# Patient Record
Sex: Female | Born: 1965 | Race: Black or African American | Hispanic: No | Marital: Single | Smoking: Current every day smoker
Health system: Southern US, Community
[De-identification: ages and names within clinical notes are randomized; demographics above are authoritative.]

## PROBLEM LIST (undated history)

## (undated) ENCOUNTER — Emergency Department (HOSPITAL_COMMUNITY): Disposition: A | Discharge status: Home / Self Care | Payer: Self-pay

## (undated) DIAGNOSIS — R569 Unspecified convulsions: Secondary | ICD-10-CM

## (undated) DIAGNOSIS — E0590 Thyrotoxicosis, unspecified without thyrotoxic crisis or storm: Secondary | ICD-10-CM

## (undated) DIAGNOSIS — K5790 Diverticulosis of intestine, part unspecified, without perforation or abscess without bleeding: Secondary | ICD-10-CM

## (undated) DIAGNOSIS — K8020 Calculus of gallbladder without cholecystitis without obstruction: Secondary | ICD-10-CM

## (undated) DIAGNOSIS — E042 Nontoxic multinodular goiter: Secondary | ICD-10-CM

## (undated) HISTORY — DX: Diverticulosis of intestine, part unspecified, without perforation or abscess without bleeding: K57.90

## (undated) HISTORY — DX: Nontoxic multinodular goiter: E04.2

## (undated) HISTORY — DX: Calculus of gallbladder without cholecystitis without obstruction: K80.20

## (undated) HISTORY — DX: Thyrotoxicosis, unspecified without thyrotoxic crisis or storm: E05.90

## (undated) HISTORY — DX: Unspecified convulsions: R56.9

---

## 1999-04-06 LAB — HM PAP SMEAR

## 2000-04-05 HISTORY — PX: ABDOMINAL HYSTERECTOMY: SHX81

## 2007-05-30 ENCOUNTER — Emergency Department (HOSPITAL_COMMUNITY): Admission: EM | Admit: 2007-05-30 | Discharge: 2007-05-30 | Payer: Self-pay | Admitting: Emergency Medicine

## 2008-07-17 ENCOUNTER — Emergency Department (HOSPITAL_COMMUNITY): Admission: EM | Admit: 2008-07-17 | Discharge: 2008-07-17 | Payer: Self-pay | Admitting: Emergency Medicine

## 2008-09-10 ENCOUNTER — Emergency Department (HOSPITAL_COMMUNITY): Admission: EM | Admit: 2008-09-10 | Discharge: 2008-09-10 | Payer: Self-pay | Admitting: Family Medicine

## 2008-11-19 ENCOUNTER — Emergency Department (HOSPITAL_COMMUNITY): Admission: EM | Admit: 2008-11-19 | Discharge: 2008-11-19 | Payer: Self-pay | Admitting: Family Medicine

## 2010-05-29 ENCOUNTER — Other Ambulatory Visit (HOSPITAL_COMMUNITY): Payer: Self-pay | Admitting: Family Medicine

## 2010-05-29 DIAGNOSIS — E049 Nontoxic goiter, unspecified: Secondary | ICD-10-CM

## 2010-06-02 ENCOUNTER — Ambulatory Visit (HOSPITAL_COMMUNITY)
Admission: RE | Admit: 2010-06-02 | Discharge: 2010-06-02 | Disposition: A | Discharge status: Home / Self Care | Payer: Self-pay | Source: Ambulatory Visit | Attending: Family Medicine | Admitting: Family Medicine

## 2010-06-02 DIAGNOSIS — E049 Nontoxic goiter, unspecified: Secondary | ICD-10-CM | POA: Insufficient documentation

## 2010-06-12 ENCOUNTER — Other Ambulatory Visit (HOSPITAL_COMMUNITY): Payer: Self-pay | Admitting: Family Medicine

## 2010-06-12 DIAGNOSIS — E041 Nontoxic single thyroid nodule: Secondary | ICD-10-CM

## 2010-06-16 ENCOUNTER — Other Ambulatory Visit: Payer: Self-pay | Admitting: Interventional Radiology

## 2010-06-16 ENCOUNTER — Ambulatory Visit (HOSPITAL_COMMUNITY)
Admission: RE | Admit: 2010-06-16 | Discharge: 2010-06-16 | Disposition: A | Discharge status: Home / Self Care | Payer: Medicaid Other | Source: Ambulatory Visit | Attending: Family Medicine | Admitting: Family Medicine

## 2010-06-16 DIAGNOSIS — E041 Nontoxic single thyroid nodule: Secondary | ICD-10-CM | POA: Insufficient documentation

## 2010-09-02 ENCOUNTER — Emergency Department (HOSPITAL_COMMUNITY): Payer: Medicaid Other

## 2010-09-02 ENCOUNTER — Emergency Department (HOSPITAL_COMMUNITY)
Admission: EM | Admit: 2010-09-02 | Discharge: 2010-09-02 | Disposition: A | Discharge status: Home / Self Care | Payer: Medicaid Other | Attending: Emergency Medicine | Admitting: Emergency Medicine

## 2010-09-02 DIAGNOSIS — R059 Cough, unspecified: Secondary | ICD-10-CM | POA: Insufficient documentation

## 2010-09-02 DIAGNOSIS — J189 Pneumonia, unspecified organism: Secondary | ICD-10-CM | POA: Insufficient documentation

## 2010-09-02 DIAGNOSIS — R05 Cough: Secondary | ICD-10-CM | POA: Insufficient documentation

## 2010-09-02 DIAGNOSIS — E0590 Thyrotoxicosis, unspecified without thyrotoxic crisis or storm: Secondary | ICD-10-CM | POA: Insufficient documentation

## 2010-09-02 DIAGNOSIS — R569 Unspecified convulsions: Secondary | ICD-10-CM | POA: Insufficient documentation

## 2011-01-07 ENCOUNTER — Other Ambulatory Visit (HOSPITAL_COMMUNITY): Payer: Self-pay | Admitting: Endocrinology

## 2011-01-07 DIAGNOSIS — E0590 Thyrotoxicosis, unspecified without thyrotoxic crisis or storm: Secondary | ICD-10-CM

## 2011-01-14 ENCOUNTER — Encounter (HOSPITAL_COMMUNITY)
Admission: RE | Admit: 2011-01-14 | Discharge: 2011-01-14 | Disposition: A | Discharge status: Home / Self Care | Payer: Medicaid Other | Source: Ambulatory Visit | Attending: Endocrinology | Admitting: Endocrinology

## 2011-01-14 DIAGNOSIS — E0590 Thyrotoxicosis, unspecified without thyrotoxic crisis or storm: Secondary | ICD-10-CM | POA: Insufficient documentation

## 2011-01-15 ENCOUNTER — Encounter (HOSPITAL_COMMUNITY)
Admission: RE | Admit: 2011-01-15 | Discharge: 2011-01-15 | Disposition: A | Discharge status: Home / Self Care | Payer: Medicaid Other | Source: Ambulatory Visit | Attending: Endocrinology | Admitting: Endocrinology

## 2011-01-15 DIAGNOSIS — E0590 Thyrotoxicosis, unspecified without thyrotoxic crisis or storm: Secondary | ICD-10-CM | POA: Insufficient documentation

## 2011-01-15 DIAGNOSIS — E042 Nontoxic multinodular goiter: Secondary | ICD-10-CM | POA: Insufficient documentation

## 2011-01-25 ENCOUNTER — Other Ambulatory Visit (HOSPITAL_COMMUNITY): Payer: Self-pay | Admitting: Endocrinology

## 2011-01-25 DIAGNOSIS — E0590 Thyrotoxicosis, unspecified without thyrotoxic crisis or storm: Secondary | ICD-10-CM

## 2011-01-29 ENCOUNTER — Encounter (HOSPITAL_COMMUNITY)
Admission: RE | Admit: 2011-01-29 | Discharge: 2011-01-29 | Disposition: A | Discharge status: Home / Self Care | Payer: Medicaid Other | Source: Ambulatory Visit | Attending: Endocrinology | Admitting: Endocrinology

## 2011-01-29 DIAGNOSIS — E0590 Thyrotoxicosis, unspecified without thyrotoxic crisis or storm: Secondary | ICD-10-CM

## 2011-01-29 DIAGNOSIS — E0500 Thyrotoxicosis with diffuse goiter without thyrotoxic crisis or storm: Secondary | ICD-10-CM | POA: Insufficient documentation

## 2011-01-29 MED ORDER — SODIUM IODIDE I 131 CAPSULE
12.7000 | Freq: Once | INTRAVENOUS | Status: AC | PRN
  Administered 2011-01-29: 12.7 via ORAL

## 2012-01-13 ENCOUNTER — Emergency Department (INDEPENDENT_AMBULATORY_CARE_PROVIDER_SITE_OTHER)
Admission: EM | Admit: 2012-01-13 | Discharge: 2012-01-13 | Disposition: A | Discharge status: Home / Self Care | Payer: Self-pay | Source: Home / Self Care

## 2012-01-13 ENCOUNTER — Encounter (HOSPITAL_COMMUNITY): Payer: Self-pay | Admitting: *Deleted

## 2012-01-13 DIAGNOSIS — E0500 Thyrotoxicosis with diffuse goiter without thyrotoxic crisis or storm: Secondary | ICD-10-CM

## 2012-01-13 LAB — TSH: TSH: <0.008 u[IU]/mL — ABNORMAL LOW (ref 0.350–4.500)

## 2012-01-13 LAB — T4, FREE: Free T4: 1.63 ng/dL (ref 0.80–1.80)

## 2012-01-13 MED ORDER — METHIMAZOLE 10 MG PO TABS: 10.0000 mg | ORAL_TABLET | Freq: Three times a day (TID) | ORAL | Status: DC

## 2012-01-13 NOTE — ED Provider Notes (Signed)
History     CSN: [id number]  Arrival date & time 01/13/12  1048   First MD Initiated Contact with Patient 01/13/12 1057      Chief Complaint  Patient presents with  . Medication Refill   HPI 46 yr old female who had [facility] as PCP, and also goes to Dr. Lucianne Muss for for Graves disease diagnosed arpil 2013 presents for med refill SHe c/o sweats and tremors, as she rasn out of the medas about 1 week ago. SHe has been here about 7 yrs and moved here from [place] She states that no one in the family has history of hyperthyroidism, and she was diagnosed after she started developing chills and tremors She takes no other medications other than her methimazole 10 mg and a beta blocker She's not had lab work in about 2 months She cannot get in to see Dr. Lucianne Muss until December 17 of this year   Past Medical History  Diagnosis Date  . Thyroid disease     History reviewed. No pertinent past surgical history.  No family history on file.  History  Substance Use Topics  . Smoking status: Current Every Day Smoker  . Smokeless tobacco: Not on file  . Alcohol Use: No    OB History    Grav Para Term Preterm Abortions TAB SAB Ect Mult Living                  Review of Systems Chest pain shortness of breath no blurred or double vision no nausea or vomiting no abdominal pain Allergies  Naproxen  Home Medications   Current Outpatient Rx  Name Route Sig Dispense Refill  . METHIMAZOLE 10 MG PO TABS Oral Take 10 mg by mouth every morning.    Marland Kitchen MAXZIDE PO Oral Take by mouth.      There were no vitals taken for this visit.  Physical Exam Pleasant oriented African American female no apparent distress, Clinically clear no added sound Abdomen soft nontender nondistended Lower extremities notable  ED Course  Procedures (including critical care time)  Labs Reviewed - No data to display No results found.   No diagnosis found.    MDM  We will get a surveillance TSH and T4  at present time-she will need to redraw the labs in about maybe 2 months prior to her visit with Dr. Lucianne Muss. I refilled her methimazole 10 mg at present time-Dr. Lucianne Muss may want to address this and increase the dosage to 20 and 30 mg dependent on T4-T3 level now for a copy of this note to him to make sure he is aware that patient presented care she cannot get in to see him  Mahala Menghini, [facility] 11:40 AM         Rhetta Mura, MD 01/13/12 1141

## 2012-01-13 NOTE — ED Notes (Signed)
Pt  Ran out  Of  Her  thyriod  Med  Has  Not  Taken in 1  Week      Cannot  See  pcp  For  sev  Months     -  Reports  Some  Sweating  And  Feels  Anxious   Looks  However  Calm  And  In no  Severe  Distress

## 2012-01-20 ENCOUNTER — Telehealth (HOSPITAL_COMMUNITY): Payer: Self-pay | Admitting: *Deleted

## 2012-01-20 NOTE — ED Notes (Signed)
Free T4 1.63, TSH 0.008 L.  Lab shown to Dr. Lorenz Coaster. He said to notify pt. of results and tell her to get lab rechecked after she has been on her medication for 2 weeks. I called pt. Pt. verified x 2 and given results.  Pt. said she would call Dr. Remus Blake office and try to get in sooner. Has appointment in Nov.  She wrote down her results to give them.  I told her to come back here next week if she can't get seen there before November.  Pt. Voiced understanding. Kimberly Hanson 01/20/2012

## 2012-07-13 ENCOUNTER — Ambulatory Visit (INDEPENDENT_AMBULATORY_CARE_PROVIDER_SITE_OTHER): Payer: BC Managed Care – PPO | Admitting: Endocrinology

## 2012-07-13 ENCOUNTER — Encounter: Payer: Self-pay | Admitting: Endocrinology

## 2012-07-13 VITALS — BP 118/80 | HR 89 | Wt 225.0 lb

## 2012-07-13 DIAGNOSIS — E042 Nontoxic multinodular goiter: Secondary | ICD-10-CM

## 2012-07-13 DIAGNOSIS — E0590 Thyrotoxicosis, unspecified without thyrotoxic crisis or storm: Secondary | ICD-10-CM

## 2012-07-13 NOTE — Progress Notes (Signed)
  Subjective:    Patient ID: Kimberly Hanson, female    DOB: Oct 26, 1965, 47 y.o.   MRN: [id number]  HPI In 2012, pt was noted to have hyperthyroidism, and a nodular goiter.  After a bx (benign), she had i-131 rx later in 2012.  She was noted to have persistent hyperthyroidism, and was rx'ed tapazole.  Due to a gap in her insurance, she was not able to return for f/u.  She has since regained her insurance, and is still on the tapazole.  She has moderate palpitations in the chest, and assoc headache.   Past Medical History  Diagnosis Date  . Thyroid disease   . Seizures     Past Surgical History  Procedure Laterality Date  . Abdominal hysterectomy      History   Social History  . Marital Status: Single    Spouse Name: N/A    Number of Children: N/A  . Years of Education: N/A   Occupational History  . Not on file.   Social History Main Topics  . Smoking status: Current Every Day Smoker  . Smokeless tobacco: Never Used  . Alcohol Use: No  . Drug Use: Not on file  . Sexually Active: Not on file   Other Topics Concern  . Not on file   Social History Narrative  . No narrative on file    No current outpatient prescriptions on file prior to visit.   No current facility-administered medications on file prior to visit.    Allergies  Allergen Reactions  . Naproxen     No family history on file.  BP 118/80  Pulse 89  Wt 225 lb (102.059 kg)  SpO2 98%  Review of Systems She has fatigue, nausea, urinary frequency, acral numbness, tremor, easy bruising, anxiety, and excessive diaphoresis.  She denies weight loss, hoarseness, double vision, sob, diarrhea, myalgias, and rhinorrhea.  She has no menses (TAH 2002).    Objective:   Physical Exam VS: see vs page GEN: no distress HEAD: head: no deformity eyes: no periorbital swelling, no proptosis external nose and ears are normal mouth: no lesion seen NECK: approx 3 cm left thyroid nodule.   CHEST WALL: no deformity LUNGS:   Clear to auscultation CV: reg rate and rhythm, no murmur ABD: abdomen is soft, nontender.  no hepatosplenomegaly.  not distended.  no hernia MUSCULOSKELETAL: muscle bulk and strength are grossly normal.  no obvious joint swelling.  gait is normal and steady EXTEMITIES: no deformity.  no edema PULSES: dorsalis pedis intact bilat.  no carotid bruit NEURO:  cn 2-12 grossly intact.   readily moves all 4's.  sensation is intact to touch on all 4's.  Slight tremor of the hands SKIN:  Normal texture and temperature.  No rash or suspicious lesion is visible.  Slightly diaphoretic NODES:  None palpable at the neck PSYCH: alert, oriented x3.  Does not appear anxious nor depressed  Lab Results  Component Value Date   TSH 0.10* 07/13/2012      Assessment & Plan:  Multinodular goiter, persistent after i-131 rx Hyperthyroidism, prob due to the multinodular goiter.  Persistent after i-131 rx Acral numbness.  Uncertain if related to hyperthyroidism

## 2012-07-13 NOTE — Patient Instructions (Addendum)
Please stop taking the methimazole.   blood tests are being requested for you today.  We'll contact you with results. let's check a thyroid "scan" (a special, but easy and painless type of thyroid x ray).  you will receive a phone call, about a day and time for an appointment.  It works like this: you go to the x-ray department of the hospital to swallow a pill, which contains a miniscule amount of radiation.  You will not notice any symptoms from this.  You will go back to the x-ray department the next day, to lie down in front of a camera.  The results of this will be sent to me.   Based on the results, i hope to order for you a treatment pill of radioactive iodine.  Although it is a larger amount of radiation, you will again notice no symptoms from this.  The pill is gone from your body in a few days (during which you should stay away from other people), but takes several months to work.  Therefore, please return here approximately 6-8 weeks after the treatment.  This treatment has been available for many years, and the only known side-effect is an underactive thyroid.  It is possible that i would eventually prescribe for you a thyroid hormone pill, which is very inexpensive.  You don't have to worry about side-effects of this thyroid hormone pill, because it is the same molecule your thyroid makes.

## 2012-07-31 ENCOUNTER — Encounter: Payer: Self-pay | Admitting: Endocrinology

## 2012-08-03 ENCOUNTER — Encounter (HOSPITAL_COMMUNITY)
Admission: RE | Admit: 2012-08-03 | Discharge: 2012-08-03 | Disposition: A | Discharge status: Home / Self Care | Payer: BC Managed Care – PPO | Source: Ambulatory Visit | Attending: Endocrinology | Admitting: Endocrinology

## 2012-08-03 ENCOUNTER — Ambulatory Visit: Payer: Self-pay | Admitting: Internal Medicine

## 2012-08-03 DIAGNOSIS — E0590 Thyrotoxicosis, unspecified without thyrotoxic crisis or storm: Secondary | ICD-10-CM | POA: Insufficient documentation

## 2012-08-04 ENCOUNTER — Other Ambulatory Visit: Payer: Self-pay | Admitting: Endocrinology

## 2012-08-04 ENCOUNTER — Encounter (HOSPITAL_COMMUNITY)
Admission: RE | Admit: 2012-08-04 | Discharge: 2012-08-04 | Disposition: A | Discharge status: Home / Self Care | Payer: BC Managed Care – PPO | Source: Ambulatory Visit | Attending: Endocrinology | Admitting: Endocrinology

## 2012-08-04 DIAGNOSIS — E0590 Thyrotoxicosis, unspecified without thyrotoxic crisis or storm: Secondary | ICD-10-CM

## 2012-08-04 MED ORDER — SODIUM PERTECHNETATE TC 99M INJECTION
10.0000 | Freq: Once | INTRAVENOUS | Status: AC | PRN
  Administered 2012-08-04: 10 via INTRAVENOUS

## 2012-08-04 MED ORDER — SODIUM IODIDE I 131 CAPSULE
11.9000 | Freq: Once | INTRAVENOUS | Status: AC | PRN
  Administered 2012-08-04: 11.9 via ORAL

## 2012-08-10 ENCOUNTER — Encounter (HOSPITAL_COMMUNITY)
Admission: RE | Admit: 2012-08-10 | Discharge: 2012-08-10 | Disposition: A | Discharge status: Home / Self Care | Payer: BC Managed Care – PPO | Source: Ambulatory Visit | Attending: Endocrinology | Admitting: Endocrinology

## 2012-08-10 DIAGNOSIS — E0590 Thyrotoxicosis, unspecified without thyrotoxic crisis or storm: Secondary | ICD-10-CM | POA: Insufficient documentation

## 2012-08-10 DIAGNOSIS — Z90710 Acquired absence of both cervix and uterus: Secondary | ICD-10-CM | POA: Insufficient documentation

## 2012-08-10 MED ORDER — SODIUM IODIDE I 131 CAPSULE
19.2000 | Freq: Once | INTRAVENOUS | Status: AC | PRN
  Administered 2012-08-10: 19.2 via ORAL

## 2012-09-06 ENCOUNTER — Other Ambulatory Visit (INDEPENDENT_AMBULATORY_CARE_PROVIDER_SITE_OTHER): Payer: BC Managed Care – PPO

## 2012-09-06 ENCOUNTER — Ambulatory Visit (INDEPENDENT_AMBULATORY_CARE_PROVIDER_SITE_OTHER): Payer: BC Managed Care – PPO | Admitting: Internal Medicine

## 2012-09-06 ENCOUNTER — Ambulatory Visit (INDEPENDENT_AMBULATORY_CARE_PROVIDER_SITE_OTHER)
Admission: RE | Admit: 2012-09-06 | Discharge: 2012-09-06 | Disposition: A | Discharge status: Home / Self Care | Payer: BC Managed Care – PPO | Source: Ambulatory Visit | Attending: Internal Medicine | Admitting: Internal Medicine

## 2012-09-06 ENCOUNTER — Encounter: Payer: Self-pay | Admitting: Internal Medicine

## 2012-09-06 ENCOUNTER — Encounter: Payer: Self-pay | Admitting: *Deleted

## 2012-09-06 VITALS — BP 112/72 | HR 69 | Temp 98.2°F | Ht 66.0 in | Wt 235.0 lb

## 2012-09-06 DIAGNOSIS — Z0000 Encounter for general adult medical examination without abnormal findings: Secondary | ICD-10-CM

## 2012-09-06 DIAGNOSIS — M5432 Sciatica, left side: Secondary | ICD-10-CM

## 2012-09-06 DIAGNOSIS — Z131 Encounter for screening for diabetes mellitus: Secondary | ICD-10-CM

## 2012-09-06 DIAGNOSIS — M5430 Sciatica, unspecified side: Secondary | ICD-10-CM

## 2012-09-06 DIAGNOSIS — Z130 Encounter for screening for diseases of the blood and blood-forming organs and certain disorders involving the immune mechanism: Secondary | ICD-10-CM

## 2012-09-06 DIAGNOSIS — Z13220 Encounter for screening for lipoid disorders: Secondary | ICD-10-CM

## 2012-09-06 DIAGNOSIS — Z23 Encounter for immunization: Secondary | ICD-10-CM

## 2012-09-06 LAB — CBC
Hemoglobin: 13.7 g/dL (ref 12.0–15.0)
MCHC: 33.7 g/dL (ref 30.0–36.0)
MCV: 96.3 fl (ref 78.0–100.0)
Platelets: 154 10*3/uL (ref 150.0–400.0)
RBC: 4.24 Mil/uL (ref 3.87–5.11)

## 2012-09-06 LAB — LIPID PANEL
LDL Cholesterol: 143 mg/dL — ABNORMAL HIGH (ref 0–99)
VLDL: 14.2 mg/dL (ref 0.0–40.0)

## 2012-09-06 LAB — BASIC METABOLIC PANEL
BUN: 9 mg/dL (ref 6–23)
Chloride: 110 mEq/L (ref 96–112)
Creatinine, Ser: 0.7 mg/dL (ref 0.4–1.2)
GFR: 114.06 mL/min (ref >60.00)
Glucose, Bld: 107 mg/dL — ABNORMAL HIGH (ref 70–99)
Potassium: 4.1 mEq/L (ref 3.5–5.1)

## 2012-09-06 LAB — HEMOGLOBIN A1C: Hgb A1c MFr Bld: 5.6 % (ref 4.6–6.5)

## 2012-09-06 NOTE — Assessment & Plan Note (Signed)
Will obtain xray of lumbar spine If evidence of stenosis will give pred taper May need neurosurg referral Perform stretching exercises as indicated on handout

## 2012-09-06 NOTE — Progress Notes (Signed)
HPI  Pt presents to the clinic today to establish care. She does not have a PCP. She does have some concerns today about intermittent pains that run down her left leg. She describes the pain as a numbness and tingling. The pain in 4/10 when it occurs. 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 She has not taken anything for the pain. She is not sure what is causing it.  Flu: never Tetanus: 1999 Eye doctor: as needed Dentist: yearly Colonoscopy: 1998 Mammogram: never LMP: partial hysterectomy  Past Medical History  Diagnosis Date  . Hyperthyroidism   . Seizures   . Multinodular goiter     No current outpatient prescriptions on file.   No current facility-administered medications for this visit.    Allergies  Allergen Reactions  . Naproxen     Family History  Problem Relation Age of Onset  . Hypertension Mother     History   Social History  . Marital Status: Single    Spouse Name: N/A    Number of Children: 2  . Years of Education: 12+   Occupational History  . CNA    Social History Main Topics  . Smoking status: Current Every Day Smoker -- 0.25 packs/day for 5 years    Types: Cigarettes  . Smokeless tobacco: Never Used  . Alcohol Use: Yes  . Drug Use: No  . Sexually Active: Yes   Other Topics Concern  . Not on file   Social History Narrative   Regular exercise-no   Caffeine Use-yes    ROS:  Constitutional: Denies fever, malaise, fatigue, headache or abrupt weight changes.  HEENT: Denies eye pain, eye redness, ear pain, ringing in the ears, wax buildup, runny nose, nasal congestion, bloody nose, or sore throat. Respiratory: Denies difficulty breathing, shortness of breath, cough or sputum production.   Cardiovascular: Denies chest pain, chest tightness, palpitations or swelling in the hands or feet.  Gastrointestinal: Pt reports diarrhea. Denies abdominal pain, bloating, constipation, or blood in the stool.  GU: Denies frequency,  urgency, pain with urination, blood in urine, odor or discharge. Musculoskeletal: Pt reports pain in left leg. Denies decrease in range of motion, difficulty with gait, muscle pain or joint pain and swelling.  Skin: Denies redness, rashes, lesions or ulcercations.  Neurological: Denies dizziness, difficulty with memory, difficulty with speech or problems with balance and coordination.   No other specific complaints in a complete review of systems (except as listed in HPI above).  PE:  BP 112/72  Pulse 69  Temp(Src) 98.2 F (36.8 C) (Oral)  Ht 5\' 6"  (1.676 m)  Wt 235 lb (106.595 kg)  BMI 37.95 kg/m2  SpO2 97% Wt Readings from Last 3 Encounters:  09/06/12 235 lb (106.595 kg)  07/13/12 225 lb (102.059 kg)    General: Appears her stated age, obese but well developed, well nourished in NAD. HEENT: Head: normal shape and size; Eyes: sclera white, no icterus, conjunctiva pink, PERRLA and EOMs intact; Ears: Tm's gray and intact, normal light reflex; Nose: mucosa pink and moist, septum midline; Throat/Mouth: Teeth present, mucosa pink and moist, no lesions or ulcerations noted.  Neck: Normal range of motion. Neck supple, trachea midline. No massses, lumps or thyromegaly present.  Cardiovascular: Normal rate and rhythm. S1,S2 noted.  No murmur, rubs or gallops noted. No JVD or BLE edema. No carotid bruits noted. Pulmonary/Chest: Normal effort and positive vesicular breath sounds. No respiratory distress. No wheezes, rales or ronchi noted.  Abdomen: Soft  and nontender. Normal bowel sounds, no bruits noted. No distention or masses noted. Liver, spleen and kidneys non palpable. Musculoskeletal: Normal range of motion. No signs of joint swelling. No difficulty with gait. Positive straight leg raise. Neurological: Alert and oriented. Cranial nerves II-XII intact. Coordination normal. +DTRs bilaterally.  Psychiatric: Mood and affect normal. Behavior is normal. Judgment and thought content normal.       Assessment and Plan:  Preventative Health maintenance:  Work on diet and exercise Tdap given today Encouraged pt to visit eye doctor yearly Will set up mammogram Will obtain labs today Smoking cessation education provided, materials given

## 2012-09-06 NOTE — Patient Instructions (Signed)

## 2012-09-07 ENCOUNTER — Ambulatory Visit: Payer: BC Managed Care – PPO | Admitting: Endocrinology

## 2012-09-19 ENCOUNTER — Ambulatory Visit (INDEPENDENT_AMBULATORY_CARE_PROVIDER_SITE_OTHER): Payer: BC Managed Care – PPO | Admitting: Internal Medicine

## 2012-09-19 ENCOUNTER — Encounter: Payer: Self-pay | Admitting: Internal Medicine

## 2012-09-19 VITALS — BP 120/90 | HR 80 | Temp 98.3°F | Resp 16 | Wt 233.0 lb

## 2012-09-19 DIAGNOSIS — K5900 Constipation, unspecified: Secondary | ICD-10-CM

## 2012-09-19 DIAGNOSIS — M5431 Sciatica, right side: Secondary | ICD-10-CM

## 2012-09-19 DIAGNOSIS — M5430 Sciatica, unspecified side: Secondary | ICD-10-CM

## 2012-09-19 MED ORDER — VITAMIN D 1000 UNITS PO TABS: 1000.0000 [IU] | ORAL_TABLET | Freq: Every day | ORAL | Status: DC

## 2012-09-19 MED ORDER — PREDNISONE 10 MG PO TABS: ORAL_TABLET | ORAL | Status: DC

## 2012-09-19 MED ORDER — HYDROCODONE-ACETAMINOPHEN 7.5-325 MG PO TABS: 1.0000 | ORAL_TABLET | Freq: Four times a day (QID) | ORAL | Status: DC | PRN

## 2012-09-19 MED ORDER — ONDANSETRON HCL 4 MG/2ML IJ SOLN: 4.0000 mg | Freq: Once | INTRAMUSCULAR | Status: DC

## 2012-09-19 MED ORDER — MEPERIDINE HCL 50 MG/ML IJ SOLN
50.0000 mg | Freq: Once | INTRAMUSCULAR | Status: AC
  Administered 2012-09-20: 50 mg via INTRAMUSCULAR

## 2012-09-19 NOTE — Progress Notes (Signed)
  Subjective:    Patient ID: Kimberly Hanson, female    DOB: [birth year], 47 y.o.   MRN: [id number]  Back Pain This is a new problem. The problem has been gradually worsening since onset. The pain is present in the gluteal and lumbar spine. The pain is at a severity of 10/10. The pain is severe. The symptoms are aggravated by bending, position, sitting and twisting. Associated symptoms include leg pain and numbness. She has tried analgesics, NSAIDs and bed rest for the symptoms. The treatment provided mild relief.  Leg Pain  The pain is present in the right thigh, right ankle, right foot and right leg. The pain is at a severity of 10/10. The pain is severe. The pain has been worsening since onset. Associated symptoms include a loss of sensation and numbness. The symptoms are aggravated by movement.      Review of Systems  Musculoskeletal: Positive for back pain.  Neurological: Positive for numbness.       Objective:   Physical Exam  Constitutional: She appears distressed.  In pain   Cardiovascular: Normal rate.   No murmur heard. Pulmonary/Chest: She has no rales.  Abdominal: There is no tenderness. There is no guarding.  Musculoskeletal: She exhibits tenderness. She exhibits no edema.  LS is tender  Neurological:  Strait leg elev is (+) on R  Skin: No rash noted.  Psychiatric: Her behavior is normal. Judgment normal.     LS xray     Assessment & Plan:

## 2012-09-19 NOTE — Assessment & Plan Note (Signed)
6/14 due to pain Linzess samples 1 qd prn

## 2012-09-19 NOTE — Assessment & Plan Note (Signed)
Prednisone 10 mg: take 4 tabs a day x 3 days; then 3 tabs a day x 4 days; then 2 tabs a day x 4 days, then 1 tab a day x 6 days, then stop. Take pc. Norco prn See Rene Kocher in 1 wk Given Demerol/Zofran

## 2012-09-20 MED ORDER — ONDANSETRON HCL 4 MG/2ML IJ SOLN
4.0000 mg | Freq: Once | INTRAMUSCULAR | Status: AC
  Administered 2012-09-19: 4 mg via INTRAMUSCULAR

## 2012-09-20 NOTE — Addendum Note (Signed)
Addended by: Merrilyn Puma on: 09/20/2012 08:16 AM   Modules accepted: Orders

## 2012-10-18 ENCOUNTER — Encounter: Payer: Self-pay | Admitting: Endocrinology

## 2012-10-18 ENCOUNTER — Ambulatory Visit (INDEPENDENT_AMBULATORY_CARE_PROVIDER_SITE_OTHER): Payer: BC Managed Care – PPO | Admitting: Endocrinology

## 2012-10-18 VITALS — BP 118/68 | HR 64 | Temp 98.5°F | Resp 10 | Wt 229.0 lb

## 2012-10-18 DIAGNOSIS — E0590 Thyrotoxicosis, unspecified without thyrotoxic crisis or storm: Secondary | ICD-10-CM

## 2012-10-18 NOTE — Patient Instructions (Addendum)
blood tests are being requested for you today.  We'll contact you with results. Please come back for a follow-up appointment in 1 month.    

## 2012-10-18 NOTE — Progress Notes (Signed)
  Subjective:    Patient ID: Kimberly Hanson, female    DOB: 08-29-1965, 47 y.o.   MRN: [id number]  HPI in 2012, pt was noted to have hyperthyroidism, and a nodular goiter.  After a bx (benign), she had i-131 rx later in 2012.  She was noted to have persistent hyperthyroidism, and was rx'ed tapazole.  Due to a gap in her insurance, she was not able to return for f/u.  She since regained her insurance, and had a second dose of i-131 in may of 2014.  pt states she feels well in general, except for fatigue.  Past Medical History  Diagnosis Date  . Hyperthyroidism   . Seizures   . Multinodular goiter     Past Surgical History  Procedure Laterality Date  . Abdominal hysterectomy    . Cesarean section      x 2    History   Social History  . Marital Status: Single    Spouse Name: N/A    Number of Children: 2  . Years of Education: 12+   Occupational History  . CNA    Social History Main Topics  . Smoking status: Current Every Day Smoker -- 0.25 packs/day for 5 years    Types: Cigarettes  . Smokeless tobacco: Never Used  . Alcohol Use: Yes  . Drug Use: No  . Sexually Active: Yes   Other Topics Concern  . Not on file   Social History Narrative   Regular exercise-no   Caffeine Use-yes    Current Outpatient Prescriptions on File Prior to Visit  Medication Sig Dispense Refill  . cholecalciferol (VITAMIN D) 1000 UNITS tablet Take 1 tablet (1,000 Units total) by mouth daily.  100 tablet  3  . HYDROcodone-acetaminophen (NORCO) 7.5-325 MG per tablet Take 1 tablet by mouth 4 (four) times daily as needed for pain.  60 tablet  0  . predniSONE (DELTASONE) 10 MG tablet Prednisone 10 mg: take 4 tabs a day x 3 days; then 3 tabs a day x 4 days; then 2 tabs a day x 4 days, then 1 tab a day x 6 days, then stop. Take pc.  38 tablet  1   No current facility-administered medications on file prior to visit.    Allergies  Allergen Reactions  . Naproxen     Family History  Problem Relation  Age of Onset  . Hypertension Mother     BP 118/68  Pulse 64  Temp(Src) 98.5 F (36.9 C) (Oral)  Resp 10  Wt 229 lb (103.874 kg)  BMI 36.98 kg/m2  SpO2 97%    Review of Systems Denies weight change    Objective:   Physical Exam VITAL SIGNS:  See vs page GENERAL: no distress NECK: approx 3 cm left thyroid nodule is unchanged  Lab Results  Component Value Date   TSH 4.42 10/19/2012      Assessment & Plan:  Post-i-131 hypothyroidism, well-replaced

## 2012-10-19 ENCOUNTER — Other Ambulatory Visit (INDEPENDENT_AMBULATORY_CARE_PROVIDER_SITE_OTHER): Payer: BC Managed Care – PPO

## 2012-10-19 DIAGNOSIS — E0590 Thyrotoxicosis, unspecified without thyrotoxic crisis or storm: Secondary | ICD-10-CM

## 2012-10-19 LAB — T4, FREE: Free T4: 0.51 ng/dL — ABNORMAL LOW (ref 0.60–1.60)

## 2012-10-19 MED ORDER — LEVOTHYROXINE SODIUM 50 MCG PO TABS: 50.0000 ug | ORAL_TABLET | Freq: Every day | ORAL | Status: DC

## 2012-10-24 ENCOUNTER — Ambulatory Visit (INDEPENDENT_AMBULATORY_CARE_PROVIDER_SITE_OTHER): Payer: BC Managed Care – PPO | Admitting: Internal Medicine

## 2012-10-24 ENCOUNTER — Encounter: Payer: Self-pay | Admitting: Internal Medicine

## 2012-10-24 VITALS — BP 110/74 | HR 68 | Temp 98.0°F | Wt 230.0 lb

## 2012-10-24 DIAGNOSIS — M5431 Sciatica, right side: Secondary | ICD-10-CM

## 2012-10-24 DIAGNOSIS — J209 Acute bronchitis, unspecified: Secondary | ICD-10-CM

## 2012-10-24 DIAGNOSIS — M5430 Sciatica, unspecified side: Secondary | ICD-10-CM

## 2012-10-24 MED ORDER — HYDROCODONE-HOMATROPINE 5-1.5 MG/5ML PO SYRP: 5.0000 mL | ORAL_SOLUTION | Freq: Three times a day (TID) | ORAL | Status: DC | PRN

## 2012-10-24 MED ORDER — AZITHROMYCIN 250 MG PO TABS: ORAL_TABLET | ORAL | Status: DC

## 2012-10-24 MED ORDER — HYDROCODONE-ACETAMINOPHEN 7.5-325 MG PO TABS: 1.0000 | ORAL_TABLET | Freq: Four times a day (QID) | ORAL | Status: DC | PRN

## 2012-10-24 NOTE — Progress Notes (Signed)
Subjective:    Patient ID: Kimberly Hanson, female    DOB: 04/10/1965, 47 y.o.   MRN: [id number]  HPI  Pt presents to the clinic today to f/u her sciatica. She was seen early June for the same with pain on the left. Xray was performed of the lumbar spine and did not show any significant stenosis, only mild arthritis. She returned to the clinic 1 week later with c/o worsening sciatica but on the right side. She was given pred taper and Norco. The pain is much better but she is still experiencing pain. She does need a refill of her Norco. She is wondering what else can be done about the sciatica. Additionally, she c/o fatigue, headache, fever and productive cough. She is coughing of thick yellow sputum. This has been going on for 10 days. She has taken Nyquil and Robitussin OTC. She has no history of allergies or asthma. She does smoke on a daily basis. She has had sick contacts.  Review of Systems      Past Medical History  Diagnosis Date  . Hyperthyroidism   . Seizures   . Multinodular goiter     Current Outpatient Prescriptions  Medication Sig Dispense Refill  . cholecalciferol (VITAMIN D) 1000 UNITS tablet Take 1 tablet (1,000 Units total) by mouth daily.  100 tablet  3  . HYDROcodone-acetaminophen (NORCO) 7.5-325 MG per tablet Take 1 tablet by mouth 4 (four) times daily as needed for pain.  60 tablet  0  . levothyroxine (SYNTHROID, LEVOTHROID) 50 MCG tablet Take 1 tablet (50 mcg total) by mouth daily before breakfast.  30 tablet  1  . predniSONE (DELTASONE) 10 MG tablet Prednisone 10 mg: take 4 tabs a day x 3 days; then 3 tabs a day x 4 days; then 2 tabs a day x 4 days, then 1 tab a day x 6 days, then stop. Take pc.  38 tablet  1  . azithromycin (ZITHROMAX) 250 MG tablet Take 2 tablets today, then 1 tablet daily for 4 days  6 tablet  0  . HYDROcodone-homatropine (HYCODAN) 5-1.5 MG/5ML syrup Take 5 mLs by mouth every 8 (eight) hours as needed for cough.  120 mL  0   No current  facility-administered medications for this visit.    Allergies  Allergen Reactions  . Naproxen     Family History  Problem Relation Age of Onset  . Hypertension Mother     History   Social History  . Marital Status: Single    Spouse Name: N/A    Number of Children: 2  . Years of Education: 12+   Occupational History  . CNA    Social History Main Topics  . Smoking status: Current Every Day Smoker -- 0.25 packs/day for 5 years    Types: Cigarettes  . Smokeless tobacco: Never Used  . Alcohol Use: Yes  . Drug Use: No  . Sexually Active: Yes   Other Topics Concern  . Not on file   Social History Narrative   Regular exercise-no   Caffeine Use-yes     Constitutional: Pt reports fatigue, fever and headache. Denies malaise, or abrupt weight changes.  HEENT: Denies eye pain, eye redness, ear pain, ringing in the ears, wax buildup, runny nose, nasal congestion, bloody nose, or sore throat. Respiratory: Pt reports cough and chest congestion. Denies difficulty breathing, shortness of breath, cough or sputum production.   Gastrointestinal: Denies abdominal pain, bloating, constipation, diarrhea or blood in the stool.  Musculoskeletal: Pt reports  low back pain. Denies decrease in range of motion, difficulty with gait, muscle pain or joint pain and swelling.   Neurological: Pt reports sharp shooting pain in right leg. Denies dizziness, difficulty with memory, difficulty with speech or problems with balance and coordination.   No other specific complaints in a complete review of systems (except as listed in HPI above).  Objective:   Physical Exam  BP 110/74  Pulse 68  Temp(Src) 98 F (36.7 C) (Oral)  Wt 230 lb (104.327 kg)  BMI 37.14 kg/m2  SpO2 97% Wt Readings from Last 3 Encounters:  10/24/12 230 lb (104.327 kg)  10/18/12 229 lb (103.874 kg)  09/19/12 233 lb (105.688 kg)    General: Appears her stated age, well developed, well nourished in NAD. HEENT: Head: normal  shape and size; Eyes: sclera white, no icterus, conjunctiva pink, PERRLA and EOMs intact; Ears: Tm's gray and intact, normal light reflex; Nose: mucosa pink and moist, septum midline; Throat/Mouth: Teeth present, mucosa pink and moist, no exudate, lesions or ulcerations noted.  Cardiovascular: Normal rate and rhythm. S1,S2 noted.  No murmur, rubs or gallops noted. No JVD or BLE edema. No carotid bruits noted. Pulmonary/Chest: Normal effort and scattered rhonchi noted and bilateral expiratory wheeze. No respiratory distress.  Musculoskeletal: Normal range of motion. No signs of joint swelling. No difficulty with gait.  Neurological: Alert and oriented. Cranial nerves II-XII intact. Coordination normal. +DTRs bilaterally. Positive straight leg raise.   BMET    Component Value Date/Time   NA 139 09/06/2012 1017   K 4.1 09/06/2012 1017   CL 110 09/06/2012 1017   CO2 20 09/06/2012 1017   GLUCOSE 107* 09/06/2012 1017   BUN 9 09/06/2012 1017   CREATININE 0.7 09/06/2012 1017   CALCIUM 8.8 09/06/2012 1017    Lipid Panel     Component Value Date/Time   CHOL 198 09/06/2012 1017   TRIG 71.0 09/06/2012 1017   HDL 41.30 09/06/2012 1017   CHOLHDL 5 09/06/2012 1017   VLDL 14.2 09/06/2012 1017   LDLCALC 143* 09/06/2012 1017    CBC    Component Value Date/Time   WBC 7.1 09/06/2012 1017   RBC 4.24 09/06/2012 1017   HGB 13.7 09/06/2012 1017   HCT 40.8 09/06/2012 1017   PLT 154.0 09/06/2012 1017   MCV 96.3 09/06/2012 1017   MCHC 33.7 09/06/2012 1017   RDW 13.3 09/06/2012 1017    Hgb A1C Lab Results  Component Value Date   HGBA1C 5.6 09/06/2012         Assessment & Plan:   Acute Bronchitis, new onset:  Encouraged smoking cessation eRx for azithromycin eRx for Hycodan- do not use within 8 hours of Norco

## 2012-10-24 NOTE — Patient Instructions (Signed)

## 2012-10-24 NOTE — Assessment & Plan Note (Signed)
Refilled Norco Will refer to Neurosurgery

## 2012-11-07 ENCOUNTER — Encounter: Payer: Self-pay | Admitting: Endocrinology

## 2012-11-15 ENCOUNTER — Ambulatory Visit: Payer: BC Managed Care – PPO | Admitting: Endocrinology

## 2012-11-15 DIAGNOSIS — Z0289 Encounter for other administrative examinations: Secondary | ICD-10-CM

## 2012-12-06 ENCOUNTER — Ambulatory Visit (INDEPENDENT_AMBULATORY_CARE_PROVIDER_SITE_OTHER): Payer: BC Managed Care – PPO | Admitting: Internal Medicine

## 2012-12-06 ENCOUNTER — Encounter: Payer: Self-pay | Admitting: Internal Medicine

## 2012-12-06 VITALS — BP 110/74 | HR 65 | Temp 97.3°F | Wt 232.5 lb

## 2012-12-06 DIAGNOSIS — M5431 Sciatica, right side: Secondary | ICD-10-CM

## 2012-12-06 DIAGNOSIS — M5430 Sciatica, unspecified side: Secondary | ICD-10-CM

## 2012-12-06 MED ORDER — KETOROLAC TROMETHAMINE 30 MG/ML IJ SOLN
30.0000 mg | Freq: Once | INTRAMUSCULAR | Status: AC
  Administered 2012-12-06: 30 mg via INTRAMUSCULAR

## 2012-12-06 MED ORDER — HYDROCODONE-ACETAMINOPHEN 7.5-325 MG PO TABS: 1.0000 | ORAL_TABLET | Freq: Four times a day (QID) | ORAL | Status: DC | PRN

## 2012-12-06 MED ORDER — METHYLPREDNISOLONE ACETATE 80 MG/ML IJ SUSP
80.0000 mg | Freq: Once | INTRAMUSCULAR | Status: AC
Start: 2012-12-06 — End: 2012-12-06
  Administered 2012-12-06: 80 mg via INTRAMUSCULAR

## 2012-12-06 MED ORDER — KETOROLAC TROMETHAMINE 30 MG/ML IM SOLN: 30.0000 mg | Freq: Once | INTRAMUSCULAR | Status: DC

## 2012-12-06 NOTE — Progress Notes (Signed)
Subjective:    Patient ID: Kimberly Hanson, female    DOB: 1965/12/25, 47 y.o.   MRN: [id number]  HPI  Pt presents to the clinic today with c/o back pain. This started 1 day ago.She is experiencing sharp shooting pain down her right leg. She does have a history of sciatica on the right. She does have an appointment with a neurologist for a MRI. She would like a refill of her pain medication today.  Review of Systems      Past Medical History  Diagnosis Date  . Hyperthyroidism   . Seizures   . Multinodular goiter     Current Outpatient Prescriptions  Medication Sig Dispense Refill  . azithromycin (ZITHROMAX) 250 MG tablet Take 2 tablets today, then 1 tablet daily for 4 days  6 tablet  0  . cholecalciferol (VITAMIN D) 1000 UNITS tablet Take 1 tablet (1,000 Units total) by mouth daily.  100 tablet  3  . HYDROcodone-homatropine (HYCODAN) 5-1.5 MG/5ML syrup Take 5 mLs by mouth every 8 (eight) hours as needed for cough.  120 mL  0  . levothyroxine (SYNTHROID, LEVOTHROID) 50 MCG tablet Take 1 tablet (50 mcg total) by mouth daily before breakfast.  30 tablet  1  . predniSONE (DELTASONE) 10 MG tablet Prednisone 10 mg: take 4 tabs a day x 3 days; then 3 tabs a day x 4 days; then 2 tabs a day x 4 days, then 1 tab a day x 6 days, then stop. Take pc.  38 tablet  1  . HYDROcodone-acetaminophen (NORCO) 7.5-325 MG per tablet Take 1 tablet by mouth 4 (four) times daily as needed for pain.  60 tablet  0   No current facility-administered medications for this visit.    Allergies  Allergen Reactions  . Naproxen     Family History  Problem Relation Age of Onset  . Hypertension Mother     History   Social History  . Marital Status: Single    Spouse Name: N/A    Number of Children: 2  . Years of Education: 12+   Occupational History  . CNA    Social History Main Topics  . Smoking status: Current Every Day Smoker -- 0.25 packs/day for 5 years    Types: Cigarettes  . Smokeless tobacco:  Never Used  . Alcohol Use: Yes  . Drug Use: No  . Sexual Activity: Yes   Other Topics Concern  . Not on file   Social History Narrative   Regular exercise-no   Caffeine Use-yes     Constitutional: Denies fever, malaise, fatigue, headache or abrupt weight changes.  Musculoskeletal: Pt reports back pain. Denies decrease in range of motion, difficulty with gait, or joint pain and swelling.  Neurological: Pt reports sharp shooting pain down right leg. Denies dizziness, difficulty with memory, difficulty with speech or problems with balance and coordination.   No other specific complaints in a complete review of systems (except as listed in HPI above).  Objective:   Physical Exam  BP 110/74  Pulse 65  Temp(Src) 97.3 F (36.3 C) (Oral)  Wt 232 lb 8 oz (105.461 kg)  BMI 37.54 kg/m2  SpO2 98% Wt Readings from Last 3 Encounters:  12/06/12 232 lb 8 oz (105.461 kg)  10/24/12 230 lb (104.327 kg)  10/18/12 229 lb (103.874 kg)    General: Appears her stated age, well developed, well nourished in NAD. Cardiovascular: Normal rate and rhythm. S1,S2 noted.  No murmur, rubs or gallops noted. No JVD  or BLE edema. No carotid bruits noted. Pulmonary/Chest: Normal effort and positive vesicular breath sounds. No respiratory distress. No wheezes, rales or ronchi noted.  Musculoskeletal: Normal range of motion. No signs of joint swelling. No difficulty with gait.  Neurological: Alert and oriented. Cranial nerves II-XII intact. Coordination normal. +DTRs bilaterally. Positive straight leg raise.   BMET    Component Value Date/Time   NA 139 09/06/2012 1017   K 4.1 09/06/2012 1017   CL 110 09/06/2012 1017   CO2 20 09/06/2012 1017   GLUCOSE 107* 09/06/2012 1017   BUN 9 09/06/2012 1017   CREATININE 0.7 09/06/2012 1017   CALCIUM 8.8 09/06/2012 1017    Lipid Panel     Component Value Date/Time   CHOL 198 09/06/2012 1017   TRIG 71.0 09/06/2012 1017   HDL 41.30 09/06/2012 1017   CHOLHDL 5 09/06/2012 1017   VLDL  14.2 09/06/2012 1017   LDLCALC 143* 09/06/2012 1017    CBC    Component Value Date/Time   WBC 7.1 09/06/2012 1017   RBC 4.24 09/06/2012 1017   HGB 13.7 09/06/2012 1017   HCT 40.8 09/06/2012 1017   PLT 154.0 09/06/2012 1017   MCV 96.3 09/06/2012 1017   MCHC 33.7 09/06/2012 1017   RDW 13.3 09/06/2012 1017    Hgb A1C Lab Results  Component Value Date   HGBA1C 5.6 09/06/2012         Assessment & Plan:   Sciatica neuralgia, right, recurrent:  80 mg depo IM today, 30 mg toradol IM Refilled norco Perform stretching exercises as indicated on handout Followup with neurology for MRI.  RTC as needed or if symptoms persist or worsen

## 2012-12-06 NOTE — Progress Notes (Signed)
HPI: Pt presents with complaints with right lower back pain related to sciatica. Pt has had issues similar to past. Symptoms started a day ago and have not been relieved with OTC ibuprofen. Patient has appointment with neurologist to obtain a MRI and f/u for reoccurring inflammatory episodes.  Patients reports that steroid taper worked well in past. Rates current pain 10/10,  Past Medical History  Diagnosis Date  . Hyperthyroidism   . Seizures   . Multinodular goiter     Current Outpatient Prescriptions  Medication Sig Dispense Refill  . azithromycin (ZITHROMAX) 250 MG tablet Take 2 tablets today, then 1 tablet daily for 4 days  6 tablet  0  . cholecalciferol (VITAMIN D) 1000 UNITS tablet Take 1 tablet (1,000 Units total) by mouth daily.  100 tablet  3  . HYDROcodone-homatropine (HYCODAN) 5-1.5 MG/5ML syrup Take 5 mLs by mouth every 8 (eight) hours as needed for cough.  120 mL  0  . levothyroxine (SYNTHROID, LEVOTHROID) 50 MCG tablet Take 1 tablet (50 mcg total) by mouth daily before breakfast.  30 tablet  1  . predniSONE (DELTASONE) 10 MG tablet Prednisone 10 mg: take 4 tabs a day x 3 days; then 3 tabs a day x 4 days; then 2 tabs a day x 4 days, then 1 tab a day x 6 days, then stop. Take pc.  38 tablet  1  . HYDROcodone-acetaminophen (NORCO) 7.5-325 MG per tablet Take 1 tablet by mouth 4 (four) times daily as needed for pain.  60 tablet  0   No current facility-administered medications for this visit.    Allergies  Allergen Reactions  . Naproxen     Family History  Problem Relation Age of Onset  . Hypertension Mother     History   Social History  . Marital Status: Single    Spouse Name: N/A    Number of Children: 2  . Years of Education: 12+   Occupational History  . CNA    Social History Main Topics  . Smoking status: Current Every Day Smoker -- 0.25 packs/day for 5 years    Types: Cigarettes  . Smokeless tobacco: Never Used  . Alcohol Use: Yes  . Drug Use: No  .  Sexual Activity: Yes   Other Topics Concern  . Not on file   Social History Narrative   Regular exercise-no   Caffeine Use-yes    ROS:  Constitutional: Denies fever, malaise, fatigue, headache or abrupt weight changes. . Musculoskeletal: Endorses muscle pain that radiates down right side, difficulty with gait, and muscle tenderness.   Neurological: Denies dizziness, difficulty with memory, difficulty with speech or problems with balance and coordination.   No other specific complaints in a complete review of systems (except as listed in HPI above).  PE:  BP 110/74  Pulse 65  Temp(Src) 97.3 F (36.3 C) (Oral)  Wt 232 lb 8 oz (105.461 kg)  BMI 37.54 kg/m2  SpO2 98% Wt Readings from Last 3 Encounters:  12/06/12 232 lb 8 oz (105.461 kg)  10/24/12 230 lb (104.327 kg)  10/18/12 229 lb (103.874 kg)    General: Appears their stated age, well developed, well nourished in NAD. Musculoskeletal: Muscle tenderness to right side. Limited range of motion to lower lumbar/sacral area.      Assessment and Plan: IM 80mg  Depo injection and Toradol 30mg  given for symptom relief Refilled Norco prescription Continue with neurology appointment  Follow up for symptom management  Shanequia Kendrick, Jacques Earthly, Student-NP

## 2012-12-06 NOTE — Patient Instructions (Signed)

## 2012-12-15 ENCOUNTER — Ambulatory Visit (INDEPENDENT_AMBULATORY_CARE_PROVIDER_SITE_OTHER): Payer: BC Managed Care – PPO | Admitting: Endocrinology

## 2012-12-15 ENCOUNTER — Encounter: Payer: Self-pay | Admitting: Endocrinology

## 2012-12-15 VITALS — BP 132/80 | HR 80 | Ht 66.0 in | Wt 235.0 lb

## 2012-12-15 DIAGNOSIS — E042 Nontoxic multinodular goiter: Secondary | ICD-10-CM

## 2012-12-15 LAB — T4, FREE: Free T4: 0.72 ng/dL (ref 0.60–1.60)

## 2012-12-15 LAB — TSH: TSH: 3.12 u[IU]/mL (ref 0.35–5.50)

## 2012-12-15 NOTE — Progress Notes (Signed)
  Subjective:    Patient ID: Kimberly Hanson, female    DOB: [birth year], 47 y.o.   MRN: [id number]  HPI in 2012, pt was noted to have hyperthyroidism, and a nodular goiter.  After a bx (benign), she had i-131 rx later in 2012.  She was noted to have persistent hyperthyroidism, and was rx'ed tapazole.  Due to a gap in her insurance, she was not able to return for f/u.  She since regained her insurance, and had a second dose of i-131 in may of 2014.  In July of 2014, she was noted to have abnl TFT, so she was started on synthroid.  pt states she feels well in general, except for headache.   Past Medical History  Diagnosis Date  . Hyperthyroidism   . Seizures   . Multinodular goiter     Past Surgical History  Procedure Laterality Date  . Abdominal hysterectomy    . Cesarean section      x 2    History   Social History  . Marital Status: Single    Spouse Name: N/A    Number of Children: 2  . Years of Education: 12+   Occupational History  . CNA    Social History Main Topics  . Smoking status: Current Every Day Smoker -- 0.25 packs/day for 5 years    Types: Cigarettes  . Smokeless tobacco: Never Used  . Alcohol Use: Yes  . Drug Use: No  . Sexual Activity: Yes   Other Topics Concern  . Not on file   Social History Narrative   Regular exercise-no   Caffeine Use-yes    Current Outpatient Prescriptions on File Prior to Visit  Medication Sig Dispense Refill  . cholecalciferol (VITAMIN D) 1000 UNITS tablet Take 1 tablet (1,000 Units total) by mouth daily.  100 tablet  3  . HYDROcodone-acetaminophen (NORCO) 7.5-325 MG per tablet Take 1 tablet by mouth 4 (four) times daily as needed for pain.  60 tablet  0  . levothyroxine (SYNTHROID, LEVOTHROID) 50 MCG tablet Take 1 tablet (50 mcg total) by mouth daily before breakfast.  30 tablet  1  . predniSONE (DELTASONE) 10 MG tablet Prednisone 10 mg: take 4 tabs a day x 3 days; then 3 tabs a day x 4 days; then 2 tabs a day x 4 days, then 1  tab a day x 6 days, then stop. Take pc.  38 tablet  1   No current facility-administered medications on file prior to visit.    Allergies  Allergen Reactions  . Naproxen     Family History  Problem Relation Age of Onset  . Hypertension Mother     BP 132/80  Pulse 80  Ht 5\' 6"  (1.676 m)  Wt 235 lb (106.595 kg)  BMI 37.95 kg/m2  SpO2 97%  Review of Systems Fatigue is better    Objective:   Physical Exam VITAL SIGNS:  See vs page GENERAL: no distress NECK: approx 3 cm left thyroid nodule is unchanged.  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

## 2012-12-15 NOTE — Patient Instructions (Addendum)
blood tests are being requested for you today.  We'll contact you with results. Please come back for a follow-up appointment in 6 weeks.   

## 2013-01-01 ENCOUNTER — Other Ambulatory Visit: Payer: Self-pay | Admitting: Endocrinology

## 2013-01-09 ENCOUNTER — Other Ambulatory Visit: Payer: Self-pay | Admitting: Endocrinology

## 2013-01-23 ENCOUNTER — Encounter: Payer: Self-pay | Admitting: Internal Medicine

## 2013-01-23 ENCOUNTER — Ambulatory Visit (INDEPENDENT_AMBULATORY_CARE_PROVIDER_SITE_OTHER): Payer: BC Managed Care – PPO | Admitting: Internal Medicine

## 2013-01-23 VITALS — BP 132/84 | HR 68 | Temp 98.3°F | Ht 66.0 in | Wt 235.5 lb

## 2013-01-23 DIAGNOSIS — Z23 Encounter for immunization: Secondary | ICD-10-CM

## 2013-01-23 DIAGNOSIS — R300 Dysuria: Secondary | ICD-10-CM

## 2013-01-23 DIAGNOSIS — N3090 Cystitis, unspecified without hematuria: Secondary | ICD-10-CM

## 2013-01-23 LAB — POCT URINALYSIS DIPSTICK
Blood, UA: NEGATIVE
Glucose, UA: NEGATIVE
Nitrite, UA: NEGATIVE
Urobilinogen, UA: 0.2
pH, UA: 6

## 2013-01-23 NOTE — Addendum Note (Signed)
Addended by: Jalynn Punches E on: 01/23/2013 11:49 AM   Modules accepted: Orders

## 2013-01-23 NOTE — Progress Notes (Signed)
HPI: Pt presents to the office today with complaints of urinary discomfort for about one week. Pt endorses having urinary frequency, burning, and lower pelvic discomfort. She denies fever, chills, or blood in urine. Pt tried OTC cranberry tablets with relief, but discontinued after one day of use. Pt does endorse taking frequent bubble baths to relieve back pain.   Past Medical History  Diagnosis Date  . Hyperthyroidism   . Seizures   . Multinodular goiter     Current Outpatient Prescriptions  Medication Sig Dispense Refill  . cholecalciferol (VITAMIN D) 1000 UNITS tablet Take 1 tablet (1,000 Units total) by mouth daily.  100 tablet  3  . HYDROcodone-acetaminophen (NORCO) 7.5-325 MG per tablet Take 1 tablet by mouth 4 (four) times daily as needed for pain.  60 tablet  0  . levothyroxine (SYNTHROID, LEVOTHROID) 50 MCG tablet TAKE 1 TABLET BY MOUTH EVERY DAY BEFORE BREAKFAST  30 tablet  3  . predniSONE (DELTASONE) 10 MG tablet Prednisone 10 mg: take 4 tabs a day x 3 days; then 3 tabs a day x 4 days; then 2 tabs a day x 4 days, then 1 tab a day x 6 days, then stop. Take pc.  38 tablet  1   No current facility-administered medications for this visit.    Allergies  Allergen Reactions  . Naproxen     ROS:  Constitutional: Denies fever, malaise, fatigue, headache or abrupt weight changes.   Gastrointestinal: Denies abdominal pain, bloating, constipation, diarrhea or blood in the stool.  GU: Endorses urinary frequency/urgency, pelvic pain, and burning. Denies blood in urine, odor or discharge.. .   No other specific complaints in a complete review of systems (except as listed in HPI above).  PE:  BP 132/84  Pulse 68  Temp(Src) 98.3 F (36.8 C) (Oral)  Ht 5\' 6"  (1.676 m)  Wt 235 lb 8 oz (106.822 kg)  BMI 38.03 kg/m2  SpO2 98% Wt Readings from Last 3 Encounters:  01/23/13 235 lb 8 oz (106.822 kg)  12/15/12 235 lb (106.595 kg)  12/06/12 232 lb 8 oz (105.461 kg)    General:  Appears their stated age, well developed, well nourished in NAD.  Abdomen: Soft and bilateral lower pelvic area tender to palpation. Normal bowel sounds, no bruits noted. No distention or masses noted. Liver, spleen and kidneys non palpable. Psychiatric: Mood and affect normal. Behavior is normal. Judgment and thought content normal.     Assessment and Plan: Cystitis Increase fluid intake; stay away from carbonated/sugary drinks Reccommended OTC AZO tablets and follow directions on box, do not exceed three days of use Stop bubble baths Follow up in 3-5 days if symptoms worsen or not relieved  Jachai Okazaki S, Student-NP

## 2013-01-23 NOTE — Patient Instructions (Signed)

## 2013-01-23 NOTE — Progress Notes (Signed)
HPI  Pt presents to the clinic today with c/o urinary urgency, frequency and dysuria. These symptoms started about 1 week ago. She did try some cranberry tablets OTC but only took for one day. She does report that she doesn't drink very much water. More juice than anything. She also reports taking bubble baths to help with her back pain.   Review of Systems  Past Medical History  Diagnosis Date  . Hyperthyroidism   . Seizures   . Multinodular goiter     Family History  Problem Relation Age of Onset  . Hypertension Mother     History   Social History  . Marital Status: Single    Spouse Name: N/A    Number of Children: 2  . Years of Education: 12+   Occupational History  . CNA    Social History Main Topics  . Smoking status: Current Every Day Smoker -- 0.25 packs/day for 5 years    Types: Cigarettes  . Smokeless tobacco: Never Used  . Alcohol Use: Yes  . Drug Use: No  . Sexual Activity: Yes   Other Topics Concern  . Not on file   Social History Narrative   Regular exercise-no   Caffeine Use-yes    Allergies  Allergen Reactions  . Naproxen     Constitutional: Denies fever, malaise, fatigue, headache or abrupt weight changes.   GU: Pt reports urgency, frequency and pain with urination. Denies burning sensation, blood in urine, odor or discharge. Skin: Denies redness, rashes, lesions or ulcercations.   No other specific complaints in a complete review of systems (except as listed in HPI above).    Objective:   Physical Exam  BP 132/84  Pulse 68  Temp(Src) 98.3 F (36.8 C) (Oral)  Ht 5\' 6"  (1.676 m)  Wt 235 lb 8 oz (106.822 kg)  BMI 38.03 kg/m2  SpO2 98% Wt Readings from Last 3 Encounters:  01/23/13 235 lb 8 oz (106.822 kg)  12/15/12 235 lb (106.595 kg)  12/06/12 232 lb 8 oz (105.461 kg)    General: Appears her stated age, well developed, well nourished in NAD. Cardiovascular: Normal rate and rhythm. S1,S2 noted.  No murmur, rubs or gallops noted.  No JVD or BLE edema. No carotid bruits noted. Pulmonary/Chest: Normal effort and positive vesicular breath sounds. No respiratory distress. No wheezes, rales or ronchi noted.  Abdomen: Soft and nontender. Normal bowel sounds, no bruits noted. No distention or masses noted. Liver, spleen and kidneys non palpable. Tender to palpation over the bladder area. No CVA tenderness.      Assessment & Plan:  Dysuria, urgency and frequency secondary to cystitis:  Can use OTC AZO for symptoms relief Avoid caffeine, cigarettes or other things that irritate the bladder Drink plenty of water Stop taking bubble baths as this can irritate your urethra  RTC as needed or if symptoms persist.

## 2013-01-31 ENCOUNTER — Telehealth: Payer: Self-pay | Admitting: Internal Medicine

## 2013-01-31 NOTE — Telephone Encounter (Signed)
Still having UTI symptoms.  It has gotten worse.  Requesting something to be called in.  [facility] [address]

## 2013-02-01 ENCOUNTER — Other Ambulatory Visit: Payer: Self-pay | Admitting: Internal Medicine

## 2013-02-01 MED ORDER — CIPROFLOXACIN HCL 500 MG PO TABS: 500.0000 mg | ORAL_TABLET | Freq: Two times a day (BID) | ORAL | Status: DC

## 2013-02-01 NOTE — Telephone Encounter (Signed)
cipro called in to [facility]

## 2013-02-01 NOTE — Telephone Encounter (Signed)
Patient notified...ds,cma 

## 2013-03-07 ENCOUNTER — Encounter: Payer: Self-pay | Admitting: Gastroenterology

## 2013-03-27 ENCOUNTER — Telehealth: Payer: Self-pay | Admitting: *Deleted

## 2013-03-27 ENCOUNTER — Other Ambulatory Visit: Payer: Self-pay

## 2013-03-27 DIAGNOSIS — M5431 Sciatica, right side: Secondary | ICD-10-CM

## 2013-03-27 MED ORDER — HYDROCODONE-ACETAMINOPHEN 7.5-325 MG PO TABS: 1.0000 | ORAL_TABLET | Freq: Four times a day (QID) | ORAL | Status: DC | PRN

## 2013-03-27 NOTE — Telephone Encounter (Signed)
Spoke with pt and informed her that Rx is at the front desk and avail for pickup;informed her that a gov't issued photo id is required for pickup

## 2013-03-27 NOTE — Telephone Encounter (Signed)
Pt left v/m requesting rx Norco for same back pain. Call pt when ready for pick up.

## 2013-03-28 ENCOUNTER — Ambulatory Visit: Payer: BC Managed Care – PPO | Admitting: Gastroenterology

## 2013-05-16 ENCOUNTER — Ambulatory Visit: Payer: BC Managed Care – PPO | Admitting: Endocrinology

## 2013-05-16 DIAGNOSIS — Z0289 Encounter for other administrative examinations: Secondary | ICD-10-CM

## 2013-06-08 ENCOUNTER — Ambulatory Visit (INDEPENDENT_AMBULATORY_CARE_PROVIDER_SITE_OTHER): Payer: BC Managed Care – PPO | Admitting: Endocrinology

## 2013-06-08 ENCOUNTER — Encounter: Payer: Self-pay | Admitting: Endocrinology

## 2013-06-08 VITALS — BP 112/78 | HR 83 | Temp 98.4°F | Ht 66.0 in | Wt 241.0 lb

## 2013-06-08 DIAGNOSIS — E890 Postprocedural hypothyroidism: Secondary | ICD-10-CM

## 2013-06-08 DIAGNOSIS — E042 Nontoxic multinodular goiter: Secondary | ICD-10-CM

## 2013-06-08 LAB — TSH: TSH: 8.4 u[IU]/mL — AB (ref 0.35–5.50)

## 2013-06-08 NOTE — Progress Notes (Signed)
   Subjective:    Patient ID: Kimberly Hanson, female    DOB: [birth year], 48 y.o.   MRN: [id number]  HPI in 2012, pt was noted to have hyperthyroidism, and a nodular goiter.  After a bx (benign), she had i-131 rx later in 2012.  She was noted to have persistent hyperthyroidism, and was rx'ed tapazole.  Due to a gap in her insurance, she was not able to return for f/u.  She then regained her insurance, and had a second dose of i-131 in may of 2014.  In July of 2014, she was noted to have abnl TFT, so she was started on synthroid.  She reports fatigue.   Past Medical History  Diagnosis Date  . Hyperthyroidism   . Seizures   . Multinodular goiter     Past Surgical History  Procedure Laterality Date  . Abdominal hysterectomy    . Cesarean section      x 2    History   Social History  . Marital Status: Single    Spouse Name: N/A    Number of Children: 2  . Years of Education: 12+   Occupational History  . CNA    Social History Main Topics  . Smoking status: Current Every Day Smoker -- 0.25 packs/day for 5 years    Types: Cigarettes  . Smokeless tobacco: Never Used  . Alcohol Use: Yes  . Drug Use: No  . Sexual Activity: Yes   Other Topics Concern  . Not on file   Social History Narrative   Regular exercise-no   Caffeine Use-yes    Current Outpatient Prescriptions on File Prior to Visit  Medication Sig Dispense Refill  . cholecalciferol (VITAMIN D) 1000 UNITS tablet Take 1 tablet (1,000 Units total) by mouth daily.  100 tablet  3  . HYDROcodone-acetaminophen (NORCO) 7.5-325 MG per tablet Take 1 tablet by mouth 4 (four) times daily as needed.  60 tablet  0  . levothyroxine (SYNTHROID, LEVOTHROID) 50 MCG tablet TAKE 1 TABLET BY MOUTH EVERY DAY BEFORE BREAKFAST  30 tablet  3   No current facility-administered medications on file prior to visit.    Allergies  Allergen Reactions  . Naproxen     Family History  Problem Relation Age of Onset  . Hypertension Mother      BP 112/78  Pulse 83  Temp(Src) 98.4 F (36.9 C) (Oral)  Ht 5\' 6"  (1.676 m)  Wt 241 lb (109.317 kg)  BMI 38.92 kg/m2  SpO2 96%  Review of Systems She has weight gain.      Objective:   Physical Exam VITAL SIGNS:  See vs page. GENERAL: no distress. NECK: approx 3 cm left thyroid nodule is unchanged.     Lab Results  Component Value Date   TSH 8.40* 06/08/2013      Assessment & Plan:  Nodular goiter, clinically unchanged. post-I-131 hypothyroidism.  She needs increased rx.

## 2013-06-08 NOTE — Patient Instructions (Signed)
blood tests are being requested for you today.  We'll contact you with results. Let's also recheck the ultrasound.  you will receive a phone call, about a day and time for an appointment.   Please come back for a follow-up appointment in 3 months.

## 2013-06-09 MED ORDER — LEVOTHYROXINE SODIUM 75 MCG PO TABS: 75.0000 ug | ORAL_TABLET | Freq: Every day | ORAL | Status: DC

## 2013-06-12 ENCOUNTER — Ambulatory Visit
Admission: RE | Admit: 2013-06-12 | Discharge: 2013-06-12 | Disposition: A | Discharge status: Home / Self Care | Payer: BC Managed Care – PPO | Source: Ambulatory Visit | Attending: Endocrinology | Admitting: Endocrinology

## 2013-06-12 DIAGNOSIS — E042 Nontoxic multinodular goiter: Secondary | ICD-10-CM

## 2013-06-18 ENCOUNTER — Encounter: Payer: Self-pay | Admitting: Gastroenterology

## 2013-06-26 ENCOUNTER — Emergency Department (HOSPITAL_COMMUNITY)
Admission: EM | Admit: 2013-06-26 | Discharge: 2013-06-26 | Disposition: A | Discharge status: Home / Self Care | Payer: BC Managed Care – PPO | Attending: Emergency Medicine | Admitting: Emergency Medicine

## 2013-06-26 ENCOUNTER — Emergency Department (HOSPITAL_COMMUNITY): Payer: BC Managed Care – PPO

## 2013-06-26 ENCOUNTER — Encounter (HOSPITAL_COMMUNITY): Payer: Self-pay | Admitting: Emergency Medicine

## 2013-06-26 DIAGNOSIS — K922 Gastrointestinal hemorrhage, unspecified: Secondary | ICD-10-CM | POA: Insufficient documentation

## 2013-06-26 DIAGNOSIS — R42 Dizziness and giddiness: Secondary | ICD-10-CM | POA: Insufficient documentation

## 2013-06-26 DIAGNOSIS — F17200 Nicotine dependence, unspecified, uncomplicated: Secondary | ICD-10-CM | POA: Insufficient documentation

## 2013-06-26 DIAGNOSIS — Z8669 Personal history of other diseases of the nervous system and sense organs: Secondary | ICD-10-CM | POA: Insufficient documentation

## 2013-06-26 DIAGNOSIS — Z79899 Other long term (current) drug therapy: Secondary | ICD-10-CM | POA: Insufficient documentation

## 2013-06-26 DIAGNOSIS — R109 Unspecified abdominal pain: Secondary | ICD-10-CM

## 2013-06-26 DIAGNOSIS — E0590 Thyrotoxicosis, unspecified without thyrotoxic crisis or storm: Secondary | ICD-10-CM | POA: Insufficient documentation

## 2013-06-26 LAB — COMPREHENSIVE METABOLIC PANEL
ALBUMIN: 3.5 g/dL (ref 3.5–5.2)
ALK PHOS: 67 U/L (ref 39–117)
ALT: 13 U/L (ref 0–35)
AST: 17 U/L (ref 0–37)
BUN: 13 mg/dL (ref 6–23)
CHLORIDE: 102 meq/L (ref 96–112)
CO2: 20 meq/L (ref 19–32)
Calcium: 9 mg/dL (ref 8.4–10.5)
Creatinine, Ser: 0.81 mg/dL (ref 0.50–1.10)
GFR calc Af Amer: >90 mL/min (ref >90)
GFR, EST NON AFRICAN AMERICAN: 86 mL/min — AB (ref >90)
Glucose, Bld: 163 mg/dL — ABNORMAL HIGH (ref 70–99)
POTASSIUM: 3.9 meq/L (ref 3.7–5.3)
Sodium: 138 mEq/L (ref 137–147)
Total Protein: 6.8 g/dL (ref 6.0–8.3)

## 2013-06-26 LAB — CBC WITH DIFFERENTIAL/PLATELET
BASOS ABS: 0 10*3/uL (ref 0.0–0.1)
Basophils Relative: 0 % (ref 0–1)
EOS PCT: 1 % (ref 0–5)
Eosinophils Absolute: 0.1 10*3/uL (ref 0.0–0.7)
HEMATOCRIT: 35 % — AB (ref 36.0–46.0)
HEMOGLOBIN: 12.2 g/dL (ref 12.0–15.0)
LYMPHS ABS: 2.6 10*3/uL (ref 0.7–4.0)
LYMPHS PCT: 43 % (ref 12–46)
MCH: 32.5 pg (ref 26.0–34.0)
MCHC: 34.9 g/dL (ref 30.0–36.0)
MCV: 93.3 fL (ref 78.0–100.0)
MONO ABS: 0.3 10*3/uL (ref 0.1–1.0)
MONOS PCT: 6 % (ref 3–12)
NEUTROS ABS: 3 10*3/uL (ref 1.7–7.7)
Neutrophils Relative %: 50 % (ref 43–77)
Platelets: 184 10*3/uL (ref 150–400)
RBC: 3.75 MIL/uL — ABNORMAL LOW (ref 3.87–5.11)
RDW: 13.1 % (ref 11.5–15.5)
WBC: 6 10*3/uL (ref 4.0–10.5)

## 2013-06-26 LAB — URINALYSIS, ROUTINE W REFLEX MICROSCOPIC
BILIRUBIN URINE: NEGATIVE
GLUCOSE, UA: NEGATIVE mg/dL
HGB URINE DIPSTICK: NEGATIVE
KETONES UR: NEGATIVE mg/dL
Leukocytes, UA: NEGATIVE
Nitrite: NEGATIVE
PROTEIN: NEGATIVE mg/dL
Specific Gravity, Urine: 1.001 — ABNORMAL LOW (ref 1.005–1.030)
UROBILINOGEN UA: 0.2 mg/dL (ref 0.0–1.0)
pH: 7 (ref 5.0–8.0)

## 2013-06-26 MED ORDER — MORPHINE SULFATE 4 MG/ML IJ SOLN
4.0000 mg | Freq: Once | INTRAMUSCULAR | Status: AC
  Administered 2013-06-26: 4 mg via INTRAVENOUS
  Filled 2013-06-26: qty 1

## 2013-06-26 MED ORDER — HYDROCODONE-ACETAMINOPHEN 7.5-325 MG PO TABS: 1.0000 | ORAL_TABLET | Freq: Four times a day (QID) | ORAL | Status: DC | PRN

## 2013-06-26 MED ORDER — IOHEXOL 300 MG/ML  SOLN
50.0000 mL | Freq: Once | INTRAMUSCULAR | Status: AC | PRN
  Administered 2013-06-26: 50 mL via ORAL

## 2013-06-26 MED ORDER — ONDANSETRON HCL 4 MG/2ML IJ SOLN
4.0000 mg | Freq: Once | INTRAMUSCULAR | Status: AC
  Administered 2013-06-26: 4 mg via INTRAVENOUS
  Filled 2013-06-26: qty 2

## 2013-06-26 MED ORDER — IOHEXOL 300 MG/ML  SOLN
100.0000 mL | Freq: Once | INTRAMUSCULAR | Status: AC | PRN
  Administered 2013-06-26: 100 mL via INTRAVENOUS

## 2013-06-26 NOTE — ED Notes (Signed)
Per pt, states abdominal pain and rectal bleeding for a month-saw PCP and was sent to GYN where she was cleared and was told to follow up with GI-has appt. In April

## 2013-06-26 NOTE — Discharge Instructions (Signed)
Abdominal Pain, Adult Many things can cause abdominal pain. Usually, abdominal pain is not caused by a disease and will improve without treatment. It can often be observed and treated at home. Your health care provider will do a physical exam and possibly order blood tests and X-rays to help determine the seriousness of your pain. However, in many cases, more time must pass before a clear cause of the pain can be found. Before that point, your health care provider may not know if you need more testing or further treatment. HOME CARE INSTRUCTIONS  Monitor your abdominal pain for any changes. The following actions may help to alleviate any discomfort you are experiencing:  Only take over-the-counter or prescription medicines as directed by your health care provider.  Do not take laxatives unless directed to do so by your health care provider.  Try a clear liquid diet (broth, tea, or water) as directed by your health care provider. Slowly move to a bland diet as tolerated. SEEK MEDICAL CARE IF:  You have unexplained abdominal pain.  You have abdominal pain associated with nausea or diarrhea.  You have pain when you urinate or have a bowel movement.  You experience abdominal pain that wakes you in the night.  You have abdominal pain that is worsened or improved by eating food.  You have abdominal pain that is worsened with eating fatty foods. SEEK IMMEDIATE MEDICAL CARE IF:   Your pain does not go away within 2 hours.  You have a fever.  You keep throwing up (vomiting).  Your pain is felt only in portions of the abdomen, such as the right side or the left lower portion of the abdomen.  You pass bloody or black tarry stools. MAKE SURE YOU:  Understand these instructions.   Will watch your condition.   Will get help right away if you are not doing well or get worse.  Document Released: 12/30/2004 Document Revised: 01/10/2013 Document Reviewed: 11/29/2012 [facility] Patient  Information 2014 [place].  Gastrointestinal Bleeding Gastrointestinal (GI) bleeding means there is bleeding somewhere along the digestive tract, between the mouth and anus. CAUSES  There are many different problems that can cause GI bleeding. Possible causes include:  Esophagitis. This is inflammation, irritation, or swelling of the esophagus.  Hemorrhoids.These are veins that are full of blood (engorged) in the rectum. They cause pain, inflammation, and may bleed.  Anal fissures.These are areas of painful tearing which may bleed. They are often caused by passing hard stool.  Diverticulosis.These are pouches that form on the colon over time, with age, and may bleed significantly.  Diverticulitis.This is inflammation in areas with diverticulosis. It can cause pain, fever, and bloody stools, although bleeding is rare.  Polyps and cancer. Colon cancer often starts out as precancerous polyps.  Gastritis and ulcers.Bleeding from the upper gastrointestinal tract (near the stomach) may travel through the intestines and produce black, sometimes tarry, often bad smelling stools. In certain cases, if the bleeding is fast enough, the stools may not be black, but red. This condition may be life-threatening. SYMPTOMS   Vomiting bright red blood or material that looks like coffee grounds.  Bloody, black, or tarry stools. DIAGNOSIS  Your caregiver may diagnose your condition by taking your history and performing a physical exam. More tests may be needed, including:  X-rays and other imaging tests.  Esophagogastroduodenoscopy (EGD). This test uses a flexible, lighted tube to look at your esophagus, stomach, and small intestine.  Colonoscopy. This test uses a flexible, lighted  tube to look at your colon. TREATMENT  Treatment depends on the cause of your bleeding.   For bleeding from the esophagus, stomach, small intestine, or colon, the caregiver doing your EGD or colonoscopy may be  able to stop the bleeding as part of the procedure.  Inflammation or infection of the colon can be treated with medicines.  Many rectal problems can be treated with creams, suppositories, or warm baths.  Surgery is sometimes needed.  Blood transfusions are sometimes needed if you have lost a lot of blood. If bleeding is slow, you may be allowed to go home. If there is a lot of bleeding, you will need to stay in the hospital for observation. HOME CARE INSTRUCTIONS   Take any medicines exactly as prescribed.  Keep your stools soft by eating foods that are high in fiber. These foods include whole grains, legumes, fruits, and vegetables. Prunes (1 to 3 a day) work well for many people.  Drink enough fluids to keep your urine clear or pale yellow. SEEK IMMEDIATE MEDICAL CARE IF:   Your bleeding increases.  You feel lightheaded, weak, or you faint.  You have severe cramps in your back or abdomen.  You pass large blood clots in your stool.  Your problems are getting worse. MAKE SURE YOU:   Understand these instructions.  Will watch your condition.  Will get help right away if you are not doing well or get worse. Document Released: 03/19/2000 Document Revised: 03/08/2012 Document Reviewed: 03/01/2011 [facility] Patient Information 2014 [place], [place].

## 2013-06-26 NOTE — ED Provider Notes (Signed)
CSN: [id number]     Arrival date & time 06/26/13  0807 History   First MD Initiated Contact with Patient 06/26/13 [phone]     Chief Complaint  Patient presents with  . Abdominal Pain  . Rectal Bleeding     (Consider location/radiation/quality/duration/timing/severity/associated sxs/prior Treatment) Patient is a 48 y.o. female presenting with abdominal pain and hematochezia. The history is provided by the patient.  Abdominal Pain Associated symptoms: hematochezia   Associated symptoms: no chest pain, no diarrhea, no nausea, no shortness of breath and no vomiting   Rectal Bleeding Associated symptoms: abdominal pain and light-headedness   Associated symptoms: no vomiting    patient's had left-sided abdominal pain for the last approximately 5 weeks. His been getting worse. She states sometimes also involves the upper abdomen, but does involve the left lower abdomen. She states she is seen gynecology and had an ultrasound which was negative. She states she's also had some rectal bleeding. The tissue is white but now is a heavy amount. She states that with gas bowel movements there is a large amount of blood. She showed me pictures of the blood in the bowl and on the tissue. She states she's had some lightheadedness. No fevers. She states it may be worse after eating. She has an appointment in 5 weeks for gastroenterology. No previous episodes of bleeding. No other sites of bleeding. She's had a previous hysterectomy. She states the stool is normal, covered in blood.  Past Medical History  Diagnosis Date  . Hyperthyroidism   . Seizures   . Multinodular goiter    Past Surgical History  Procedure Laterality Date  . Abdominal hysterectomy    . Cesarean section      x 2   Family History  Problem Relation Age of Onset  . Hypertension Mother    History  Substance Use Topics  . Smoking status: Current Every Day Smoker -- 0.25 packs/day for 5 years    Types: Cigarettes  . Smokeless tobacco:  Never Used  . Alcohol Use: Yes   OB History   Grav Para Term Preterm Abortions TAB SAB Ect Mult Living                 Review of Systems  Constitutional: Negative for activity change and appetite change.  Eyes: Negative for pain.  Respiratory: Negative for chest tightness and shortness of breath.   Cardiovascular: Negative for chest pain and leg swelling.  Gastrointestinal: Positive for abdominal pain, blood in stool and hematochezia. Negative for nausea, vomiting and diarrhea.  Genitourinary: Negative for flank pain.  Musculoskeletal: Negative for back pain and neck stiffness.  Skin: Negative for rash.  Neurological: Positive for light-headedness. Negative for weakness, numbness and headaches.  Hematological: Does not bruise/bleed easily.  Psychiatric/Behavioral: Negative for behavioral problems.      Allergies  Naproxen  Home Medications   Current Outpatient Rx  Name  Route  Sig  Dispense  Refill  . acetaminophen (TYLENOL) 500 MG tablet   Oral   Take 1,000 mg by mouth every 6 (six) hours as needed for mild pain.         Marland Kitchen levothyroxine (SYNTHROID, LEVOTHROID) 75 MCG tablet   Oral   Take 1 tablet (75 mcg total) by mouth daily before breakfast.   30 tablet   5   . HYDROcodone-acetaminophen (NORCO) 7.5-325 MG per tablet   Oral   Take 1 tablet by mouth 4 (four) times daily as needed.   15 tablet   0  BP 133/71  Pulse 57  Temp(Src) 98 F (36.7 C) (Oral)  Resp 16  SpO2 98% Physical Exam  Nursing note and vitals reviewed. Constitutional: She is oriented to person, place, and time. She appears well-developed and well-nourished.  HENT:  Head: Normocephalic and atraumatic.  Eyes: EOM are normal. Pupils are equal, round, and reactive to light.  Neck: Normal range of motion. Neck supple.  Cardiovascular: Normal rate, regular rhythm and normal heart sounds.   No murmur heard. Pulmonary/Chest: Effort normal and breath sounds normal. No respiratory distress. She  has no wheezes. She has no rales.  Abdominal: Soft. She exhibits no distension. There is tenderness. There is no rebound and no guarding.  Tenderness to LLQ, moderate. Some left sided tenderness. No hernias palpated.  Musculoskeletal: Normal range of motion.  Neurological: She is alert and oriented to person, place, and time. No cranial nerve deficit.  Skin: Skin is warm and dry.  Psychiatric: She has a normal mood and affect. Her speech is normal.    ED Course  Procedures (including critical care time) Labs Review Labs Reviewed  CBC WITH DIFFERENTIAL - Abnormal; Notable for the following:    RBC 3.75 (*)    HCT 35.0 (*)    All other components within normal limits  COMPREHENSIVE METABOLIC PANEL - Abnormal; Notable for the following:    Glucose, Bld 163 (*)    Total Bilirubin <0.2 (*)    GFR calc non Af Amer 86 (*)    All other components within normal limits  URINALYSIS, ROUTINE W REFLEX MICROSCOPIC - Abnormal; Notable for the following:    Specific Gravity, Urine 1.001 (*)    All other components within normal limits   Imaging Review Ct Abdomen Pelvis W Contrast  06/26/2013   CLINICAL DATA:  Left lower quadrant pain.  GI bleeding.  EXAM: CT ABDOMEN AND PELVIS WITH CONTRAST  TECHNIQUE: Multidetector CT imaging of the abdomen and pelvis was performed using the standard protocol following bolus administration of intravenous contrast.  CONTRAST:  73mL OMNIPAQUE IOHEXOL 300 MG/ML SOLN, 184mL OMNIPAQUE IOHEXOL 300 MG/ML SOLN  COMPARISON:  None.  FINDINGS: Liver, spleen, pancreas, adrenal glands, and kidneys are normal except for tiny cysts in the liver.  There is a 7 mm stone in the otherwise normal appearing gallbladder. No dilated bile ducts.  There is scattered diverticula in the distal colon. Bowel is otherwise normal including the terminal ileum and appendix. Uterus has been removed. Ovaries are normal. Bladder is normal.  There is a soft disc extrusion at L4-5 centrally extending  inferiorly behind the body of L5.  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  IMPRESSION: No acute abnormality of the abdomen or pelvis.  Diverticulosis of the distal colon.  Cholelithiasis.   Electronically Signed   By: Rozetta Nunnery M.D.   On: 06/26/2013 10:54     EKG Interpretation None      MDM   Final diagnoses:  GI bleeding  Abdominal pain    Patient with GI bleeding and abdominal pain. CT scan reassuring for tenderness, but does show diverticulosis. Patient's hemoglobin lab work is overaL I discussed with [facility] gastroenterology, and she can get into the clinic on Friday, and set of in over a month. She is not appear hypotensive at this time. Will discharge home. She'll return for increasing pain or lightheadedness or dizziness. Patient states that the pain had been controlled with her pain medicine.  Jasper Riling. Alvino Chapel, MD 06/26/13 1304

## 2013-06-26 NOTE — ED Notes (Signed)
Bed: WA22 Expected date:  Expected time:  Means of arrival:  Comments: Hold for triage 2 

## 2013-06-29 ENCOUNTER — Encounter: Payer: Self-pay | Admitting: Nurse Practitioner

## 2013-06-29 ENCOUNTER — Ambulatory Visit (INDEPENDENT_AMBULATORY_CARE_PROVIDER_SITE_OTHER): Payer: BC Managed Care – PPO | Admitting: Nurse Practitioner

## 2013-06-29 VITALS — BP 110/70 | HR 66 | Ht 66.0 in | Wt 239.2 lb

## 2013-06-29 DIAGNOSIS — R109 Unspecified abdominal pain: Secondary | ICD-10-CM

## 2013-06-29 DIAGNOSIS — K625 Hemorrhage of anus and rectum: Secondary | ICD-10-CM

## 2013-06-29 DIAGNOSIS — K5730 Diverticulosis of large intestine without perforation or abscess without bleeding: Secondary | ICD-10-CM

## 2013-06-29 MED ORDER — MOVIPREP 100 G PO SOLR
1.0000 | ORAL | Status: DC
Start: 2013-06-29 — End: 2013-08-14

## 2013-06-29 NOTE — Progress Notes (Signed)
HPI :  Patient is a 48 year old female, new to this practice, referred by PCP for a three month history of abdominal pain and rectal bleeding . Initially symptoms were intermittent but over the last several days pain has been constant , bleeding more frequent. The lower abdominal pain is worse after BM. Pain wakes her up at night. She has the urge to defecate more often than usual but is not having diarrhea, stools are soft. Patient was seen in the emergency department 3 days ago for these symptoms. CT scan with contrast negative for acute abnormalities. White count normal. Hemoglobin 12.2 down from 13.09 September 2012. No [place] of IBD  Past Medical History  Diagnosis Date  . Hyperthyroidism   . Seizures   . Multinodular goiter     Family History  Problem Relation Age of Onset  . Hypertension Mother   . Colon cancer Neg Hx   . Colon polyps Mother    History  Substance Use Topics  . Smoking status: Current Every Day Smoker -- 0.25 packs/day for 5 years    Types: Cigarettes  . Smokeless tobacco: Never Used     Comment: tobacco info given 06/29/13  . Alcohol Use: Yes     Comment: social   Current Outpatient Prescriptions  Medication Sig Dispense Refill  . acetaminophen (TYLENOL) 500 MG tablet Take 1,000 mg by mouth every 6 (six) hours as needed for mild pain.      Marland Kitchen HYDROcodone-acetaminophen (NORCO) 7.5-325 MG per tablet Take 1 tablet by mouth 4 (four) times daily as needed.  15 tablet  0  . levothyroxine (SYNTHROID, LEVOTHROID) 75 MCG tablet Take 1 tablet (75 mcg total) by mouth daily before breakfast.  30 tablet  5   No current facility-administered medications for this visit.   Allergies  Allergen Reactions  . Naproxen Anaphylaxis    Review of Systems: All systems reviewed and negative except where noted in HPI.   Ct Abdomen Pelvis W Contrast  06/26/2013   CLINICAL DATA:  Left lower quadrant pain.  GI bleeding.  EXAM: CT ABDOMEN AND PELVIS WITH CONTRAST  TECHNIQUE:  Multidetector CT imaging of the abdomen and pelvis was performed using the standard protocol following bolus administration of intravenous contrast.  CONTRAST:  41mL OMNIPAQUE IOHEXOL 300 MG/ML SOLN, 112mL OMNIPAQUE IOHEXOL 300 MG/ML SOLN  COMPARISON:  None.  FINDINGS: Liver, spleen, pancreas, adrenal glands, and kidneys are normal except for tiny cysts in the liver.  There is a 7 mm stone in the otherwise normal appearing gallbladder. No dilated bile ducts.  There is scattered diverticula in the distal colon. Bowel is otherwise normal including the terminal ileum and appendix. Uterus has been removed. Ovaries are normal. Bladder is normal.  There is a soft disc extrusion at L4-5 centrally extending inferiorly behind the body of L5.  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  IMPRESSION: No acute abnormality of the abdomen or pelvis.  Diverticulosis of the distal colon.  Cholelithiasis.   Electronically Signed   By: Rozetta Nunnery M.D.   On: 06/26/2013 10:54    Physical Exam: BP 110/70  Pulse 66  Ht 5\' 6"  (1.676 m)  Wt 239 lb 3.2 oz (108.5 kg)  BMI 38.63 kg/m2 Constitutional: Pleasant,well-developed, black female in no acute distress. HEENT: Normocephalic and atraumatic. Conjunctivae are normal. No scleral icterus. Neck supple.  Cardiovascular: Normal rate, regular rhythm.  Pulmonary/chest: Effort normal and breath sounds normal. No wheezing,  rales or rhonchi. Abdominal: Soft, nondistended, nontender. Bowel sounds active throughout. There are no masses palpable. No hepatomegaly. Extremities: no edema Lymphadenopathy: No cervical adenopathy noted. Neurological: Alert and oriented to person place and time. Skin: Skin is warm and dry. No rashes noted. Psychiatric: Normal mood and affect. Behavior is normal.   ASSESSMENT AND PLAN:  53. 48 year old black female with progressive lower abdominal pain and bleeding over last three months. Etiology  unclear. CTscan with contrast at recent ED visit was negative for acute findings. Her hgb is down slightly over a gram since June however. Rule out IBD though surprising that there is no inflammation of bowel on recent CTscan. For further evaluation patient will be scheduled for colonoscopy. The risks, benefits, and alternatives to colonoscopy with possible biopsy and possible polypectomy were discussed with the patient and she consents to proceed.    2. Hx hyperthyroidism, s/p RAI. On replacement therapy now.

## 2013-06-29 NOTE — Patient Instructions (Signed)
You have been scheduled for a colonoscopy with propofol. Please follow written instructions given to you at your visit today.  Please pick up your prep kit at the pharmacy within the next 1-3 days. [facility] [address].  If you use inhalers (even only as needed), please bring them with you on the day of your procedure. Your physician has requested that you go to www.startemmi.com and enter the access code given to you at your visit today. This web site gives a general overview about your procedure. However, you should still follow specific instructions given to you by our office regarding your preparation for the procedure.

## 2013-07-01 DIAGNOSIS — K625 Hemorrhage of anus and rectum: Secondary | ICD-10-CM | POA: Insufficient documentation

## 2013-07-01 DIAGNOSIS — R109 Unspecified abdominal pain: Secondary | ICD-10-CM | POA: Insufficient documentation

## 2013-07-02 NOTE — Progress Notes (Signed)
i agree with the plan above 

## 2013-07-04 ENCOUNTER — Ambulatory Visit (HOSPITAL_COMMUNITY)
Admission: RE | Admit: 2013-07-04 | Discharge: 2013-07-04 | Disposition: A | Discharge status: Home / Self Care | Payer: BC Managed Care – PPO | Source: Ambulatory Visit | Attending: Gastroenterology | Admitting: Gastroenterology

## 2013-07-04 ENCOUNTER — Encounter (HOSPITAL_COMMUNITY)
Admission: RE | Disposition: A | Discharge status: Home / Self Care | Payer: Self-pay | Source: Ambulatory Visit | Attending: Gastroenterology

## 2013-07-04 ENCOUNTER — Encounter (HOSPITAL_COMMUNITY): Payer: Self-pay | Admitting: *Deleted

## 2013-07-04 ENCOUNTER — Telehealth: Payer: Self-pay

## 2013-07-04 DIAGNOSIS — K625 Hemorrhage of anus and rectum: Secondary | ICD-10-CM

## 2013-07-04 DIAGNOSIS — D126 Benign neoplasm of colon, unspecified: Secondary | ICD-10-CM

## 2013-07-04 DIAGNOSIS — K648 Other hemorrhoids: Secondary | ICD-10-CM | POA: Insufficient documentation

## 2013-07-04 DIAGNOSIS — R1032 Left lower quadrant pain: Secondary | ICD-10-CM | POA: Insufficient documentation

## 2013-07-04 DIAGNOSIS — R109 Unspecified abdominal pain: Secondary | ICD-10-CM

## 2013-07-04 DIAGNOSIS — K5730 Diverticulosis of large intestine without perforation or abscess without bleeding: Secondary | ICD-10-CM

## 2013-07-04 DIAGNOSIS — F17200 Nicotine dependence, unspecified, uncomplicated: Secondary | ICD-10-CM | POA: Insufficient documentation

## 2013-07-04 DIAGNOSIS — Z79899 Other long term (current) drug therapy: Secondary | ICD-10-CM | POA: Insufficient documentation

## 2013-07-04 DIAGNOSIS — K644 Residual hemorrhoidal skin tags: Secondary | ICD-10-CM

## 2013-07-04 HISTORY — PX: COLONOSCOPY: SHX5424

## 2013-07-04 SURGERY — COLONOSCOPY
Anesthesia: Moderate Sedation

## 2013-07-04 MED ORDER — DIPHENHYDRAMINE HCL 50 MG/ML IJ SOLN
INTRAMUSCULAR | Status: AC
  Filled 2013-07-04: qty 1

## 2013-07-04 MED ORDER — DIPHENHYDRAMINE HCL 50 MG/ML IJ SOLN
INTRAMUSCULAR | Status: DC | PRN
  Administered 2013-07-04: 25 mg via INTRAVENOUS

## 2013-07-04 MED ORDER — FENTANYL CITRATE 0.05 MG/ML IJ SOLN
INTRAMUSCULAR | Status: DC | PRN
  Administered 2013-07-04 (×3): 25 ug via INTRAVENOUS

## 2013-07-04 MED ORDER — MIDAZOLAM HCL 10 MG/2ML IJ SOLN
INTRAMUSCULAR | Status: DC | PRN
  Administered 2013-07-04 (×3): 2 mg via INTRAVENOUS

## 2013-07-04 MED ORDER — MIDAZOLAM HCL 10 MG/2ML IJ SOLN
INTRAMUSCULAR | Status: AC
  Filled 2013-07-04: qty 4

## 2013-07-04 MED ORDER — SODIUM CHLORIDE 0.9 % IV SOLN
INTRAVENOUS | Status: DC
  Administered 2013-07-04: 500 mL via INTRAVENOUS

## 2013-07-04 MED ORDER — HYOSCYAMINE SULFATE ER 0.375 MG PO TB12: 0.3750 mg | ORAL_TABLET | Freq: Two times a day (BID) | ORAL | Status: DC

## 2013-07-04 MED ORDER — FENTANYL CITRATE 0.05 MG/ML IJ SOLN
INTRAMUSCULAR | Status: AC
  Filled 2013-07-04: qty 2

## 2013-07-04 NOTE — Interval H&P Note (Signed)
History and Physical Interval Note:  07/04/2013 8:26 AM  Kimberly Hanson  has presented today for surgery, with the diagnosis of Rectal bleeding 569.3 DIverticulosis 562.10  The various methods of treatment have been discussed with the patient and family. After consideration of risks, benefits and other options for treatment, the patient has consented to  Procedure(s): COLONOSCOPY (N/A) as a surgical intervention .  The patient's history has been reviewed, patient examined, no change in status, stable for surgery.  I have reviewed the patient's chart and labs.  Questions were answered to the patient's satisfaction.     Milus Banister

## 2013-07-04 NOTE — Telephone Encounter (Signed)
Message copied by Barron Alvine on Wed Jul 04, 2013  9:39 AM ------      Message from: Owens Loffler P      Created: Wed Jul 04, 2013  9:28 AM       She needs cbc next Wednesday.  She knows to go to basement ------

## 2013-07-04 NOTE — Op Note (Signed)
[facility] [place], [address]   COLONOSCOPY PROCEDURE REPORT  PATIENT: Kimberly Hanson, Kimberly Hanson  MR#: [id number] BIRTHDATE: 31-Jan-1966 , 47  yrs. old GENDER: Female ENDOSCOPIST: Milus Banister, MD PROCEDURE DATE:  07/04/2013 PROCEDURE:   Colonoscopy with snare polypectomy First Screening Colonoscopy - Avg.  risk and is 50 yrs.  old or older - No.  Prior Negative Screening - Now for repeat screening. N/A  History of Adenoma - Now for follow-up colonoscopy & has been > or = to 3 yrs.  N/A  Polyps Removed Today? Yes. ASA CLASS:   Class II INDICATIONS:abdominal pain, bleeding (cbc normal, cmet, CT scan all normal). MEDICATIONS: Fentanyl 75 mcg IV, Versed 7 mg IV, and Benadryl 25 mg IV  DESCRIPTION OF PROCEDURE:   After the risks benefits and alternatives of the procedure were thoroughly explained, informed consent was obtained.  A digital rectal exam revealed no abnormalities of the rectum.   The Pentax Ped Colon [id number] endoscope was introduced through the anus and advanced to the terminal ileum which was intubated for a short distance. No adverse events experienced.   The quality of the prep was excellent.  The instrument was then slowly withdrawn as the colon was fully examined.  COLON FINDINGS: One polyp was found, removed and sent to pathology. This was 57mm, located in cecum, sessile, removed with snare/cautery.  The terminal ileum was normal.  There were a few scattered diverticulum throughout the colon.  There were medium sized, non-thrombosed external anal hemorrhoids.  The examination was otherwise normal.  Retroflexed views revealed no abnormalities. The time to cecum=3 minutes 00 seconds.  Withdrawal time=8 minutes 00 seconds.  The scope was withdrawn and the procedure completed. COMPLICATIONS: There were no complications. ENDOSCOPIC IMPRESSION: One polyp was found, removed and sent to pathology. There were a few scattered diverticulum  throughout the colon. There were medium sized, non-thrombosed external anal hemorrhoids. These are likely the source of her mild intermittent rectal bleeding. The examination was otherwise normal.  No clear source of your abdominal pain was found. May be from bowel spasm for which narcotic pain medicines are usually not helpful. RECOMMENDATIONS: 1. If the polyp(s) removed today are proven to be adenomatous (pre-cancerous) polyps, you will need a repeat colonoscopy in 5 years.  Otherwise you should continue to follow colorectal cancer screening guidelines for "routine risk" patients with colonoscopy in 10 years.  You will receive a letter within 1-2 weeks with the results of your biopsy as well as final recommendations.  Please call my office if you have not received a letter after 3 weeks. 2. Please apply OTC preparation H to your external hemorrhoids as needed for anal pain, bleeding. 3. Trial of twice daily antispasm medicine for possible IBS related abdominal pains.  eSigned:  Milus Banister, MD 07/04/2013 9:11 AM

## 2013-07-04 NOTE — H&P (View-Only) (Signed)
HPI :  Patient is a 48 year old female, new to this practice, referred by PCP for a three month history of abdominal pain and rectal bleeding . Initially symptoms were intermittent but over the last several days pain has been constant , bleeding more frequent. The lower abdominal pain is worse after BM. Pain wakes her up at night. She has the urge to defecate more often than usual but is not having diarrhea, stools are soft. Patient was seen in the emergency department 3 days ago for these symptoms. CT scan with contrast negative for acute abnormalities. White count normal. Hemoglobin 12.2 down from 13.09 September 2012. No [place] of IBD  Past Medical History  Diagnosis Date  . Hyperthyroidism   . Seizures   . Multinodular goiter     Family History  Problem Relation Age of Onset  . Hypertension Mother   . Colon cancer Neg Hx   . Colon polyps Mother    History  Substance Use Topics  . Smoking status: Current Every Day Smoker -- 0.25 packs/day for 5 years    Types: Cigarettes  . Smokeless tobacco: Never Used     Comment: tobacco info given 06/29/13  . Alcohol Use: Yes     Comment: social   Current Outpatient Prescriptions  Medication Sig Dispense Refill  . acetaminophen (TYLENOL) 500 MG tablet Take 1,000 mg by mouth every 6 (six) hours as needed for mild pain.      Marland Kitchen HYDROcodone-acetaminophen (NORCO) 7.5-325 MG per tablet Take 1 tablet by mouth 4 (four) times daily as needed.  15 tablet  0  . levothyroxine (SYNTHROID, LEVOTHROID) 75 MCG tablet Take 1 tablet (75 mcg total) by mouth daily before breakfast.  30 tablet  5   No current facility-administered medications for this visit.   Allergies  Allergen Reactions  . Naproxen Anaphylaxis    Review of Systems: All systems reviewed and negative except where noted in HPI.   Ct Abdomen Pelvis W Contrast  06/26/2013   CLINICAL DATA:  Left lower quadrant pain.  GI bleeding.  EXAM: CT ABDOMEN AND PELVIS WITH CONTRAST  TECHNIQUE:  Multidetector CT imaging of the abdomen and pelvis was performed using the standard protocol following bolus administration of intravenous contrast.  CONTRAST:  41mL OMNIPAQUE IOHEXOL 300 MG/ML SOLN, 160mL OMNIPAQUE IOHEXOL 300 MG/ML SOLN  COMPARISON:  None.  FINDINGS: Liver, spleen, pancreas, adrenal glands, and kidneys are normal except for tiny cysts in the liver.  There is a 7 mm stone in the otherwise normal appearing gallbladder. No dilated bile ducts.  There is scattered diverticula in the distal colon. Bowel is otherwise normal including the terminal ileum and appendix. Uterus has been removed. Ovaries are normal. Bladder is normal.  There is a soft disc extrusion at L4-5 centrally extending inferiorly behind the body of L5.  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  IMPRESSION: No acute abnormality of the abdomen or pelvis.  Diverticulosis of the distal colon.  Cholelithiasis.   Electronically Signed   By: Rozetta Nunnery M.D.   On: 06/26/2013 10:54    Physical Exam: BP 110/70  Pulse 66  Ht 5\' 6"  (1.676 m)  Wt 239 lb 3.2 oz (108.5 kg)  BMI 38.63 kg/m2 Constitutional: Pleasant,well-developed, black female in no acute distress. HEENT: Normocephalic and atraumatic. Conjunctivae are normal. No scleral icterus. Neck supple.  Cardiovascular: Normal rate, regular rhythm.  Pulmonary/chest: Effort normal and breath sounds normal. No wheezing,  rales or rhonchi. Abdominal: Soft, nondistended, nontender. Bowel sounds active throughout. There are no masses palpable. No hepatomegaly. Extremities: no edema Lymphadenopathy: No cervical adenopathy noted. Neurological: Alert and oriented to person place and time. Skin: Skin is warm and dry. No rashes noted. Psychiatric: Normal mood and affect. Behavior is normal.   ASSESSMENT AND PLAN:  53. 48 year old black female with progressive lower abdominal pain and bleeding over last three months. Etiology  unclear. CTscan with contrast at recent ED visit was negative for acute findings. Her hgb is down slightly over a gram since June however. Rule out IBD though surprising that there is no inflammation of bowel on recent CTscan. For further evaluation patient will be scheduled for colonoscopy. The risks, benefits, and alternatives to colonoscopy with possible biopsy and possible polypectomy were discussed with the patient and she consents to proceed.    2. Hx hyperthyroidism, s/p RAI. On replacement therapy now.

## 2013-07-04 NOTE — Discharge Instructions (Signed)

## 2013-07-05 ENCOUNTER — Encounter (HOSPITAL_COMMUNITY): Payer: Self-pay | Admitting: Gastroenterology

## 2013-08-02 ENCOUNTER — Ambulatory Visit: Payer: BC Managed Care – PPO | Admitting: Gastroenterology

## 2013-08-14 ENCOUNTER — Ambulatory Visit (INDEPENDENT_AMBULATORY_CARE_PROVIDER_SITE_OTHER)
Admission: RE | Admit: 2013-08-14 | Discharge: 2013-08-14 | Disposition: A | Discharge status: Home / Self Care | Payer: BC Managed Care – PPO | Source: Ambulatory Visit | Attending: Internal Medicine | Admitting: Internal Medicine

## 2013-08-14 ENCOUNTER — Other Ambulatory Visit: Payer: BC Managed Care – PPO

## 2013-08-14 ENCOUNTER — Telehealth: Payer: Self-pay | Admitting: Internal Medicine

## 2013-08-14 ENCOUNTER — Encounter: Payer: Self-pay | Admitting: Internal Medicine

## 2013-08-14 ENCOUNTER — Ambulatory Visit (INDEPENDENT_AMBULATORY_CARE_PROVIDER_SITE_OTHER): Payer: BC Managed Care – PPO | Admitting: Internal Medicine

## 2013-08-14 VITALS — BP 126/78 | HR 68 | Temp 98.1°F | Wt 243.0 lb

## 2013-08-14 DIAGNOSIS — M25552 Pain in left hip: Secondary | ICD-10-CM

## 2013-08-14 DIAGNOSIS — Y92009 Unspecified place in unspecified non-institutional (private) residence as the place of occurrence of the external cause: Secondary | ICD-10-CM

## 2013-08-14 DIAGNOSIS — M25559 Pain in unspecified hip: Secondary | ICD-10-CM

## 2013-08-14 DIAGNOSIS — W19XXXA Unspecified fall, initial encounter: Secondary | ICD-10-CM

## 2013-08-14 MED ORDER — HYDROCODONE-ACETAMINOPHEN 7.5-325 MG PO TABS: 1.0000 | ORAL_TABLET | Freq: Three times a day (TID) | ORAL | Status: DC | PRN

## 2013-08-14 NOTE — Progress Notes (Signed)
Pre visit review using our clinic review tool, if applicable. No additional management support is needed unless otherwise documented below in the visit note. 

## 2013-08-14 NOTE — Patient Instructions (Addendum)

## 2013-08-14 NOTE — Telephone Encounter (Signed)
Relevant patient education mailed to patient.  

## 2013-08-14 NOTE — Progress Notes (Signed)
Subjective:    Patient ID: Kimberly Hanson, female    DOB: [birth year], 48 y.o.   MRN: [id number]  HPI  Pt presents to the clinic today s/p fall. She reports this occurred yesterday. She fell onto the toilet, hitting the right side of her back. She then fell to the floor. She reports that she did hear something pap. She is having pain when she tries to straighten her left leg. She also has pain in her left groin. She denies numbness or tingling down her left leg. She denies loss of bowel or bladder.  Review of Systems   Past Medical History  Diagnosis Date  . Hyperthyroidism   . Seizures   . Multinodular goiter   . Diverticulosis     On CT 06/26/13  . Cholelithiasis     On CT 06/26/13    Current Outpatient Prescriptions  Medication Sig Dispense Refill  . acetaminophen (TYLENOL) 500 MG tablet Take 1,000 mg by mouth every 6 (six) hours as needed for mild pain.      Marland Kitchen levothyroxine (SYNTHROID, LEVOTHROID) 75 MCG tablet Take 1 tablet (75 mcg total) by mouth daily before breakfast.  30 tablet  5  . HYDROcodone-acetaminophen (NORCO) 7.5-325 MG per tablet Take 1 tablet by mouth 4 (four) times daily as needed.  15 tablet  0  . hyoscyamine (LEVBID) 0.375 MG 12 hr tablet Take 1 tablet (0.375 mg total) by mouth 2 (two) times daily.  60 tablet  11   No current facility-administered medications for this visit.    Allergies  Allergen Reactions  . Naproxen Anaphylaxis    Family History  Problem Relation Age of Onset  . Hypertension Mother   . Colon cancer Neg Hx   . Colon polyps Mother     History   Social History  . Marital Status: Single    Spouse Name: N/A    Number of Children: 2  . Years of Education: 12+   Occupational History  . CNA    Social History Main Topics  . Smoking status: Current Every Day Smoker -- 0.25 packs/day for 5 years    Types: Cigarettes  . Smokeless tobacco: Never Used     Comment: tobacco info given 06/29/13  . Alcohol Use: Yes     Comment:  occasional  . Drug Use: No  . Sexual Activity: Yes   Other Topics Concern  . Not on file   Social History Narrative   Regular exercise-no   Caffeine Use-yes     Constitutional: Denies fever, malaise, fatigue, headache or abrupt weight changes.  Musculoskeletal: Pt reports low back pain, left leg pain. Denies muscle pain or joint pain and swelling.  Skin: Denies redness, rashes, lesions or ulcercations.    No other specific complaints in a complete review of systems (except as listed in HPI above).     Objective:   Physical Exam  BP 126/78  Pulse 68  Temp(Src) 98.1 F (36.7 C) (Oral)  Wt 243 lb (110.224 kg) Wt Readings from Last 3 Encounters:  08/14/13 243 lb (110.224 kg)  07/04/13 239 lb (108.41 kg)  07/04/13 239 lb (108.41 kg)    General: Appears her stated age, obese but well developed, well nourished in NAD. Cardiovascular: Normal rate and rhythm. S1,S2 noted.  No murmur, rubs or gallops noted. No JVD or BLE edema. No carotid bruits noted. Pulmonary/Chest: Normal effort and positive vesicular breath sounds. No respiratory distress. No wheezes, rales or ronchi noted.  Musculoskeletal: Ataxic gait observed.  Pain on palpation to left hip joint. Decreased fexion and extension of the back due to pain.  Decreased ROM with flexion, extension, internal rotation, external rotation, abduction, and adduction of the left hip. Decreased strength of the left leg; 3/5 on flexion, extension, internal, external rotation, abduction, and adduction. Normal ROM of the right hip. Normal ROM with flexion, extension, internal rotation, external rotation, abduction, and adduction of the right hip. 5/5 strength of the right hip.  BMET    Component Value Date/Time   NA 138 06/26/2013 0907   K 3.9 06/26/2013 0907   CL 102 06/26/2013 0907   CO2 20 06/26/2013 0907   GLUCOSE 163* 06/26/2013 0907   BUN 13 06/26/2013 0907   CREATININE 0.81 06/26/2013 0907   CALCIUM 9.0 06/26/2013 0907   GFRNONAA 86*  06/26/2013 0907   GFRAA >90 06/26/2013 0907    Lipid Panel     Component Value Date/Time   CHOL 198 09/06/2012 1017   TRIG 71.0 09/06/2012 1017   HDL 41.30 09/06/2012 1017   CHOLHDL 5 09/06/2012 1017   VLDL 14.2 09/06/2012 1017   LDLCALC 143* 09/06/2012 1017    CBC    Component Value Date/Time   WBC 6.0 06/26/2013 0907   RBC 3.75* 06/26/2013 0907   HGB 12.2 06/26/2013 0907   HCT 35.0* 06/26/2013 0907   PLT 184 06/26/2013 0907   MCV 93.3 06/26/2013 0907   MCH 32.5 06/26/2013 0907   MCHC 34.9 06/26/2013 0907   RDW 13.1 06/26/2013 0907   LYMPHSABS 2.6 06/26/2013 0907   MONOABS 0.3 06/26/2013 0907   EOSABS 0.1 06/26/2013 0907   BASOSABS 0.0 06/26/2013 0907    Hgb A1C Lab Results  Component Value Date   HGBA1C 5.6 09/06/2012      Assessment & Plan:   Left hip Pain secondary to  fall at home:   X-ray of left hip today Rx given for Norco for pain  Crutches provided for the patient  Will be in touch with pt for further instruction once receive x ray results   RTC as needed

## 2013-09-04 ENCOUNTER — Ambulatory Visit: Payer: BC Managed Care – PPO | Admitting: Gastroenterology

## 2013-09-10 ENCOUNTER — Ambulatory Visit (INDEPENDENT_AMBULATORY_CARE_PROVIDER_SITE_OTHER): Payer: BC Managed Care – PPO | Admitting: Endocrinology

## 2013-09-10 ENCOUNTER — Encounter: Payer: Self-pay | Admitting: Endocrinology

## 2013-09-10 VITALS — BP 118/66 | HR 64 | Temp 98.6°F | Ht 66.0 in | Wt 242.0 lb

## 2013-09-10 DIAGNOSIS — K625 Hemorrhage of anus and rectum: Secondary | ICD-10-CM

## 2013-09-10 DIAGNOSIS — E890 Postprocedural hypothyroidism: Secondary | ICD-10-CM

## 2013-09-10 LAB — TSH: TSH: 19.52 u[IU]/mL — AB (ref 0.35–4.50)

## 2013-09-10 MED ORDER — LEVOTHYROXINE SODIUM 112 MCG PO TABS: 112.0000 ug | ORAL_TABLET | Freq: Every day | ORAL | Status: DC

## 2013-09-10 NOTE — Progress Notes (Signed)
Subjective:    Patient ID: Kimberly Hanson, female    DOB: 1965/12/17, 48 y.o.   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   Past Medical History  Diagnosis Date  . Hyperthyroidism   . Seizures   . Multinodular goiter   . Diverticulosis     On CT 06/26/13  . Cholelithiasis     On CT 06/26/13    Past Surgical History  Procedure Laterality Date  . Abdominal hysterectomy  2002  . Cesarean section      x 2  . Colonoscopy N/A 07/04/2013    Procedure: COLONOSCOPY;  Surgeon: Milus Banister, MD;  Location: [facility];  Service: Endoscopy;  Laterality: N/A;    History   Social History  . Marital Status: Single    Spouse Name: N/A    Number of Children: 2  . Years of Education: 12+   Occupational History  . CNA    Social History Main Topics  . Smoking status: Current Every Day Smoker -- 0.25 packs/day for 5 years    Types: Cigarettes  . Smokeless tobacco: Never Used     Comment: tobacco info given 06/29/13  . Alcohol Use: Yes     Comment: occasional  . Drug Use: No  . Sexual Activity: Yes   Other Topics Concern  . Not on file   Social History Narrative   Regular exercise-no   Caffeine Use-yes    Current Outpatient Prescriptions on File Prior to Visit  Medication Sig Dispense Refill  . acetaminophen (TYLENOL) 500 MG tablet Take 1,000 mg by mouth every 6 (six) hours as needed for mild pain.      Marland Kitchen HYDROcodone-acetaminophen (NORCO) 7.5-325 MG per tablet Take 1 tablet by mouth every 8 (eight) hours as needed.  30 tablet  0  . hyoscyamine (LEVBID) 0.375 MG 12 hr tablet Take 1 tablet (0.375  mg total) by mouth 2 (two) times daily.  60 tablet  11   No current facility-administered medications on file prior to visit.    Allergies  Allergen Reactions  . Naproxen Anaphylaxis    Family History  Problem Relation Age of Onset  . Hypertension Mother   . Colon cancer Neg Hx   . Colon polyps Mother     BP 118/66  Pulse 64  Temp(Src) 98.6 F (37 C) (Oral)  Ht 5\' 6"  (1.676 m)  Wt 242 lb (109.77 kg)  BMI 39.08 kg/m2  SpO2 99%  Review of Systems She has muscle cramps.  Denies dry skin.    Objective:   Physical Exam VITAL SIGNS:  See vs page GENERAL: no distress NECK: approx 3 cm left thyroid nodule is unchanged.    Lab Results  Component Value Date   TSH 19.52* 09/10/2013      Assessment & Plan:  Nodular goiter, clinically unchanged. post-I-131 hypothyroidism.  Moderate exacerbation  Patient is advised the following: Patient Instructions  A thyroid blood test is requested for you today.  We'll contact you with results. most of the time, a "lumpy thyroid" will eventually become overactive again.  this is usually a slow process, happening over the span of many years.  It is less possible that it could become more underactive, but it could happen.   Please come back for a follow-up appointment in 3 months.    addendum: i have sent a prescription to your pharmacy, to increase synthroid

## 2013-09-10 NOTE — Patient Instructions (Addendum)
A thyroid blood test is requested for you today.  We'll contact you with results. most of the time, a "lumpy thyroid" will eventually become overactive again.  this is usually a slow process, happening over the span of many years.  It is less possible that it could become more underactive, but it could happen.   Please come back for a follow-up appointment in 3 months.

## 2013-10-24 ENCOUNTER — Other Ambulatory Visit: Payer: Self-pay

## 2013-10-24 MED ORDER — HYDROCODONE-ACETAMINOPHEN 7.5-325 MG PO TABS: 1.0000 | ORAL_TABLET | Freq: Three times a day (TID) | ORAL | Status: DC | PRN

## 2013-10-24 NOTE — Telephone Encounter (Signed)
RX printed, signed and in your inbox

## 2013-10-24 NOTE — Telephone Encounter (Signed)
Left detailed msg on VM per HIPAA letting pt know Rx is ready for pick up and i will place in the front office

## 2013-10-24 NOTE — Telephone Encounter (Signed)
Pt left v/m requesting rx hydrocodone apap. Call when ready for pick up. Pt said back pain has flared up.

## 2013-11-27 ENCOUNTER — Encounter: Payer: Self-pay | Admitting: Internal Medicine

## 2013-11-27 ENCOUNTER — Other Ambulatory Visit: Payer: Self-pay

## 2013-11-27 MED ORDER — HYDROCODONE-ACETAMINOPHEN 7.5-325 MG PO TABS: 1.0000 | ORAL_TABLET | Freq: Three times a day (TID) | ORAL | Status: DC | PRN

## 2013-11-27 NOTE — Telephone Encounter (Signed)
Pt left v/m requesting rx hydrocodone apap. Call when ready for pick up.  

## 2013-11-27 NOTE — Telephone Encounter (Signed)
Pt left vm requesting status of Norco refill.Please advise.

## 2013-11-27 NOTE — Telephone Encounter (Signed)
Rx left in front office for pick up and pt is aware  

## 2013-11-28 ENCOUNTER — Ambulatory Visit: Payer: BC Managed Care – PPO | Admitting: Internal Medicine

## 2013-11-28 DIAGNOSIS — Z0289 Encounter for other administrative examinations: Secondary | ICD-10-CM

## 2013-11-29 ENCOUNTER — Telehealth: Payer: Self-pay | Admitting: Internal Medicine

## 2013-11-29 NOTE — Telephone Encounter (Signed)
No follow up needed but please leave on schedule for no show fee.

## 2013-11-29 NOTE — Telephone Encounter (Signed)
Patient did not come for their scheduled appointment 11/29/13 Please let me know if the patient needs to be contacted immediately for follow up or if no follow up is necessary.

## 2013-12-07 ENCOUNTER — Telehealth: Payer: Self-pay

## 2013-12-07 NOTE — Telephone Encounter (Signed)
Pt returned your call. Please call pt back @ [phone]

## 2013-12-07 NOTE — Telephone Encounter (Signed)
Last UDS was negative for Norco for which she has been picking up monthly Rx for--no more controlled substances will be prescribed per [facility] Silversmith--- left message with someone on phone to have pt to return my call

## 2013-12-13 NOTE — Telephone Encounter (Signed)
Pt is aware as instructed-- no more controlled substances will be prescribed from [facility]

## 2013-12-17 ENCOUNTER — Ambulatory Visit: Payer: BC Managed Care – PPO | Admitting: Internal Medicine

## 2013-12-20 ENCOUNTER — Ambulatory Visit: Payer: BC Managed Care – PPO | Admitting: Internal Medicine

## 2013-12-20 DIAGNOSIS — Z0289 Encounter for other administrative examinations: Secondary | ICD-10-CM

## 2013-12-21 ENCOUNTER — Encounter: Payer: Self-pay | Admitting: Internal Medicine

## 2014-03-03 ENCOUNTER — Emergency Department (HOSPITAL_COMMUNITY)
Admission: EM | Admit: 2014-03-03 | Discharge: 2014-03-03 | Disposition: A | Discharge status: Home / Self Care | Payer: Self-pay | Attending: Emergency Medicine | Admitting: Emergency Medicine

## 2014-03-03 ENCOUNTER — Encounter (HOSPITAL_COMMUNITY): Payer: Self-pay | Admitting: *Deleted

## 2014-03-03 ENCOUNTER — Emergency Department (HOSPITAL_COMMUNITY): Payer: BC Managed Care – PPO

## 2014-03-03 DIAGNOSIS — Z720 Tobacco use: Secondary | ICD-10-CM | POA: Insufficient documentation

## 2014-03-03 DIAGNOSIS — H9209 Otalgia, unspecified ear: Secondary | ICD-10-CM | POA: Insufficient documentation

## 2014-03-03 DIAGNOSIS — R05 Cough: Secondary | ICD-10-CM

## 2014-03-03 DIAGNOSIS — E0590 Thyrotoxicosis, unspecified without thyrotoxic crisis or storm: Secondary | ICD-10-CM | POA: Insufficient documentation

## 2014-03-03 DIAGNOSIS — Z79899 Other long term (current) drug therapy: Secondary | ICD-10-CM | POA: Insufficient documentation

## 2014-03-03 DIAGNOSIS — J40 Bronchitis, not specified as acute or chronic: Secondary | ICD-10-CM

## 2014-03-03 DIAGNOSIS — R51 Headache: Secondary | ICD-10-CM | POA: Insufficient documentation

## 2014-03-03 DIAGNOSIS — R059 Cough, unspecified: Secondary | ICD-10-CM

## 2014-03-03 DIAGNOSIS — M791 Myalgia: Secondary | ICD-10-CM | POA: Insufficient documentation

## 2014-03-03 DIAGNOSIS — R1110 Vomiting, unspecified: Secondary | ICD-10-CM | POA: Insufficient documentation

## 2014-03-03 DIAGNOSIS — Z8719 Personal history of other diseases of the digestive system: Secondary | ICD-10-CM | POA: Insufficient documentation

## 2014-03-03 DIAGNOSIS — J209 Acute bronchitis, unspecified: Secondary | ICD-10-CM | POA: Insufficient documentation

## 2014-03-03 LAB — CBC WITH DIFFERENTIAL/PLATELET
BASOS PCT: 0 % (ref 0–1)
Basophils Absolute: 0 10*3/uL (ref 0.0–0.1)
EOS ABS: 0.1 10*3/uL (ref 0.0–0.7)
EOS PCT: 1 % (ref 0–5)
HEMATOCRIT: 32.1 % — AB (ref 36.0–46.0)
Hemoglobin: 10.3 g/dL — ABNORMAL LOW (ref 12.0–15.0)
Lymphocytes Relative: 35 % (ref 12–46)
Lymphs Abs: 2.2 10*3/uL (ref 0.7–4.0)
MCH: 28.4 pg (ref 26.0–34.0)
MCHC: 32.1 g/dL (ref 30.0–36.0)
MCV: 88.4 fL (ref 78.0–100.0)
MONO ABS: 0.6 10*3/uL (ref 0.1–1.0)
MONOS PCT: 10 % (ref 3–12)
Neutro Abs: 3.4 10*3/uL (ref 1.7–7.7)
Neutrophils Relative %: 54 % (ref 43–77)
Platelets: 208 10*3/uL (ref 150–400)
RBC: 3.63 MIL/uL — ABNORMAL LOW (ref 3.87–5.11)
RDW: 15.1 % (ref 11.5–15.5)
WBC: 6.3 10*3/uL (ref 4.0–10.5)

## 2014-03-03 LAB — I-STAT TROPONIN, ED: Troponin i, poc: 0 ng/mL (ref 0.00–0.08)

## 2014-03-03 LAB — BASIC METABOLIC PANEL
Anion gap: 14 (ref 5–15)
BUN: 9 mg/dL (ref 6–23)
CO2: 21 mEq/L (ref 19–32)
CREATININE: 0.76 mg/dL (ref 0.50–1.10)
Calcium: 8.8 mg/dL (ref 8.4–10.5)
Chloride: 102 mEq/L (ref 96–112)
Glucose, Bld: 121 mg/dL — ABNORMAL HIGH (ref 70–99)
Potassium: 4.5 mEq/L (ref 3.7–5.3)
Sodium: 137 mEq/L (ref 137–147)

## 2014-03-03 MED ORDER — LIDOCAINE VISCOUS 2 % MT SOLN
15.0000 mL | Freq: Once | OROMUCOSAL | Status: AC
  Administered 2014-03-03: 15 mL via OROMUCOSAL
  Filled 2014-03-03: qty 15

## 2014-03-03 MED ORDER — LIDOCAINE VISCOUS 2 % MT SOLN: 20.0000 mL | OROMUCOSAL | Status: DC | PRN

## 2014-03-03 MED ORDER — PREDNISONE 10 MG PO TABS: 40.0000 mg | ORAL_TABLET | Freq: Every day | ORAL | Status: DC

## 2014-03-03 MED ORDER — ALBUTEROL SULFATE HFA 108 (90 BASE) MCG/ACT IN AERS: 1.0000 | INHALATION_SPRAY | Freq: Four times a day (QID) | RESPIRATORY_TRACT | Status: AC | PRN

## 2014-03-03 MED ORDER — GUAIFENESIN 100 MG/5ML PO LIQD: 100.0000 mg | ORAL | Status: DC | PRN

## 2014-03-03 MED ORDER — SODIUM CHLORIDE 0.9 % IV BOLUS (SEPSIS)
500.0000 mL | Freq: Once | INTRAVENOUS | Status: AC
  Administered 2014-03-03: 500 mL via INTRAVENOUS

## 2014-03-03 MED ORDER — AZITHROMYCIN 250 MG PO TABS: ORAL_TABLET | ORAL | Status: DC

## 2014-03-03 MED ORDER — IPRATROPIUM-ALBUTEROL 0.5-2.5 (3) MG/3ML IN SOLN
3.0000 mL | Freq: Once | RESPIRATORY_TRACT | Status: AC
  Administered 2014-03-03: 3 mL via RESPIRATORY_TRACT
  Filled 2014-03-03: qty 3

## 2014-03-03 NOTE — ED Provider Notes (Signed)
CSN: [id number]     Arrival date & time 03/03/14  8469 History   First MD Initiated Contact with Patient 03/03/14 [phone]     Chief Complaint  Patient presents with  . Cough   HPI  Patient is a 48 year old female who presents to the emergency room for evaluation of cough, fever, facial pain, and myalgias. Patient states that over the past several weeks she has been feeling ill. She has had worsening cough, shortness of breath, chest pain, fevers as high as 101, facial pain, and myalgias. Patient states that her cough has been productive of yellow and green sputum which has gotten worse over the past 2-3 days. Patient states that she is very short of breath and feels that she cannot fully breathe and or out. Patient states that she has been trying Tylenol, cough syrups, and a friend's breathing treatment at home with little relief. Patient is a current every day smoker. She states that she smokes usually 4-5 cigarettes per day. She states that this is the heaviest that she has ever smoked. She states that she is having chest tightness, and right-sided chest pain. This pain is happening all the time. She states that this pain started after her coughing. She is sometimes having post tussive vomiting after particularly hard coughing spells. She says that she is vomiting up mucus and sputum. Patient has no history of asthma or COPD that she is aware of.  Past Medical History  Diagnosis Date  . Hyperthyroidism   . Seizures   . Multinodular goiter   . Diverticulosis     On CT 06/26/13  . Cholelithiasis     On CT 06/26/13   Past Surgical History  Procedure Laterality Date  . Abdominal hysterectomy  2002  . Cesarean section      x 2  . Colonoscopy N/A 07/04/2013    Procedure: COLONOSCOPY;  Surgeon: Milus Banister, MD;  Location: [facility];  Service: Endoscopy;  Laterality: N/A;   Family History  Problem Relation Age of Onset  . Hypertension Mother   . Colon cancer Neg Hx   . Colon polyps Mother     History  Substance Use Topics  . Smoking status: Current Every Day Smoker -- 0.25 packs/day for 5 years    Types: Cigarettes  . Smokeless tobacco: Never Used     Comment: tobacco info given 06/29/13  . Alcohol Use: Yes     Comment: occasional   OB History    No data available     Review of Systems  Constitutional: Positive for fever, chills and fatigue.  HENT: Positive for congestion, ear pain, postnasal drip, rhinorrhea, sinus pressure and trouble swallowing (secondary to pain). Negative for dental problem, drooling, ear discharge, facial swelling, hearing loss, mouth sores, nosebleeds, sneezing and sore throat.   Respiratory: Positive for cough, chest tightness, shortness of breath and wheezing. Negative for apnea, choking and stridor.   Cardiovascular: Negative for chest pain, palpitations and leg swelling.  Gastrointestinal: Positive for vomiting. Negative for nausea, abdominal pain, diarrhea and constipation.  Neurological: Negative for dizziness, numbness and headaches.  All other systems reviewed and are negative.     Allergies  Naproxen  Home Medications   Prior to Admission medications   Medication Sig Start Date End Date Taking? Authorizing Provider  acetaminophen (TYLENOL) 500 MG tablet Take 1,000 mg by mouth every 6 (six) hours as needed for mild pain.    Historical Provider, MD  albuterol (PROVENTIL HFA;VENTOLIN HFA) 108 (90 BASE)  MCG/ACT inhaler Inhale 1-2 puffs into the lungs every 6 (six) hours as needed for wheezing or shortness of breath. 03/03/14   Endrit Gittins A Forcucci, PA-C  azithromycin (ZITHROMAX) 250 MG tablet Take 2 tablets the first day and 1 pill per day after that 03/03/14   Loma Sousa A Forcucci, PA-C  guaiFENesin (ROBITUSSIN) 100 MG/5ML liquid Take 5-10 mLs (100-200 mg total) by mouth every 4 (four) hours as needed for cough. 03/03/14   Kesley Mullens A Forcucci, PA-C  HYDROcodone-acetaminophen (NORCO) 7.5-325 MG per tablet Take 1 tablet by mouth every 8  (eight) hours as needed. 11/27/13   Jearld Fenton, NP  hyoscyamine (LEVBID) 0.375 MG 12 hr tablet Take 1 tablet (0.375 mg total) by mouth 2 (two) times daily. 07/04/13   Milus Banister, MD  levothyroxine (SYNTHROID, LEVOTHROID) 112 MCG tablet Take 1 tablet (112 mcg total) by mouth daily before breakfast. 09/10/13   Renato Shin, MD  lidocaine (XYLOCAINE) 2 % solution Use as directed 20 mLs in the mouth or throat as needed for mouth pain. 03/03/14   Katherinne Mofield A Forcucci, PA-C  predniSONE (DELTASONE) 10 MG tablet Take 4 tablets (40 mg total) by mouth daily. 03/03/14   Nanako Stopher A Forcucci, PA-C   BP 132/89 mmHg  Pulse 45  Temp(Src) 99.1 F (37.3 C) (Oral)  Resp 16  SpO2 100% Physical Exam  Constitutional: She is oriented to person, place, and time. She appears well-developed and well-nourished. No distress.  HENT:  Head: Normocephalic and atraumatic.  Mouth/Throat: Oropharynx is clear and moist. No oropharyngeal exudate.  Eyes: Conjunctivae and EOM are normal. Pupils are equal, round, and reactive to light. No scleral icterus.  Neck: Normal range of motion. Neck supple. No JVD present. No thyromegaly present.  Cardiovascular: Normal rate, regular rhythm, normal heart sounds and intact distal pulses.  Exam reveals no gallop and no friction rub.   No murmur heard. Pulmonary/Chest: Effort normal. No respiratory distress. She has wheezes. She has rales. She exhibits tenderness.  Minimal end expiratory wheezes heard best at the bases bilaterally lungs. There is possible crackles in the base of the right lung. There is tenderness to palpation over the right upper precordial wall.  Abdominal: Soft. Bowel sounds are normal. She exhibits no distension and no mass. There is no tenderness. There is no rebound and no guarding.  Musculoskeletal: Normal range of motion.  Lymphadenopathy:    She has no cervical adenopathy.  Neurological: She is alert and oriented to person, place, and time. She has normal  strength. No cranial nerve deficit or sensory deficit. Coordination normal.  Skin: Skin is warm and dry. She is not diaphoretic.  Psychiatric: She has a normal mood and affect. Her behavior is normal. Judgment and thought content normal.  Nursing note and vitals reviewed.   ED Course  Procedures (including critical care time) Labs Review Labs Reviewed  CBC WITH DIFFERENTIAL - Abnormal; Notable for the following:    RBC 3.63 (*)    Hemoglobin 10.3 (*)    HCT 32.1 (*)    All other components within normal limits  BASIC METABOLIC PANEL - Abnormal; Notable for the following:    Glucose, Bld 121 (*)    All other components within normal limits  I-STAT TROPOININ, ED    Imaging Review Dg Chest 2 View  03/03/2014   CLINICAL DATA:  Cough and congestion x 2-3 weeks, sore throat, chest pain on the upper right center of the chest with a burning sensation when ever she coughs, fever,  SOB, head and body aches x a few days.Hx of PNA, hyperthyroidism, seizures, diverticulosis, cholelithiasis  EXAM: CHEST - 2 VIEW  COMPARISON:  09/02/2010  FINDINGS: Lungs are clear, with interval resolution of the previously noted right upper lobe airspace infiltrate. Heart size and mediastinal contours are within normal limits. No effusion. Visualized skeletal structures are unremarkable.  IMPRESSION: No acute cardiopulmonary disease.   Electronically Signed   By: Arne Cleveland M.D.   On: 03/03/2014 09:02     EKG Interpretation None      MDM   Final diagnoses:  Bronchitis   Patient is a 48 year old female who presents to the emergency room for evaluation of cough, shortness of breath, and fever. Physical exam reveals alert and nontoxic appearing female with no apparent distress. There is minimal wheezing and possible crackles in the right lower lobe of the lung. Patient given breathing treatment here with some relief. Patient also given some fluids. CBC reveals mild anemia. There is no leukocytosis. BMP is  unremarkable. I-STAT troponin is negative. Chest x-ray reveals no infiltrates. Suspect that this is likely bronchitis at this time. Patient is perk negative. She is very low risk for PE. Doubt ACS. Will treat symptomatically at this time. We'll provide a Z-Pak, prednisone, viscous lidocaine, and Robitussin. Patient to return for worsening shortness of breath, fevers that don't respond to medications, or any other concerning symptoms. She states understanding and agreement at this time. Patient has ambulated here with an average pulse ox of 96-100%. Patient is stable for discharge at this time. She is to follow-up with her PCP. Patient has been discussed with Dr. Betsey Holiday who agrees with the above workup and plan.    Cherylann Parr, PA-C 03/03/14 [place], MD 03/03/14 [phone]

## 2014-03-03 NOTE — ED Notes (Signed)
Pt reports having a cough and not feeling well x 1 month. Cough is productive with yellow sputum and pt reports sinus congestion and drainage. Airway intact at triage.

## 2014-03-03 NOTE — ED Notes (Signed)
Patient transported to X-ray 

## 2014-03-03 NOTE — Discharge Instructions (Signed)

## 2014-03-03 NOTE — ED Notes (Signed)
   Pt ambulated well, pulse ox between 96%-100%

## 2014-03-22 ENCOUNTER — Ambulatory Visit (INDEPENDENT_AMBULATORY_CARE_PROVIDER_SITE_OTHER): Payer: BC Managed Care – PPO | Admitting: Family Medicine

## 2014-03-22 ENCOUNTER — Ambulatory Visit (INDEPENDENT_AMBULATORY_CARE_PROVIDER_SITE_OTHER)
Admission: RE | Admit: 2014-03-22 | Discharge: 2014-03-22 | Disposition: A | Discharge status: Home / Self Care | Payer: Self-pay | Source: Ambulatory Visit | Attending: Family Medicine | Admitting: Family Medicine

## 2014-03-22 ENCOUNTER — Encounter: Payer: Self-pay | Admitting: Family Medicine

## 2014-03-22 VITALS — BP 108/76 | HR 70 | Ht 66.0 in | Wt 248.0 lb

## 2014-03-22 DIAGNOSIS — M7062 Trochanteric bursitis, left hip: Secondary | ICD-10-CM

## 2014-03-22 DIAGNOSIS — M5416 Radiculopathy, lumbar region: Secondary | ICD-10-CM

## 2014-03-22 MED ORDER — GABAPENTIN 100 MG PO CAPS: 200.0000 mg | ORAL_CAPSULE | Freq: Every day | ORAL | Status: DC

## 2014-03-22 MED ORDER — PREDNISONE 50 MG PO TABS: 50.0000 mg | ORAL_TABLET | Freq: Every day | ORAL | Status: DC

## 2014-03-22 NOTE — Assessment & Plan Note (Signed)
Difficult to assess secondary to patient pain. Could be either radicular symptoms from the back or greater trochanter bursitis. Discussed with patient home exercises, icing protocol as well as topical anti-inflammatories. Patient will try these different changes and come back in 1-2 weeks. If patient continues to have pain we can do a diagnostic as well as hopefully therapeutic injection under ultrasound. We will discuss and evaluate at further at follow-up.

## 2014-03-22 NOTE — Progress Notes (Signed)
  [facility] [address], [place] [address] Phone: [phone] Subjective:    I'm seeing this patient by the request  of:  BAITY, REGINA, NP   CC: Back pain   [id number] Kimberly Hanson is a 48 y.o. female coming in with complaint of back pain.  Patient has had back pain for quite some time. Patient has 2014 x-rays showing some mild osteophytic changes. Patient was being controlled with controlled since by primary care provider per patient did fail a urine drug screen. Patient states she had a fall in February when she injured her left hip. X-rays were done and these were reviewed by me. X-rays in February of her hip were unremarkable. Patient states since then she was having intermittent pain on the lateral aspect of the hip as well as back pain. Patient states that the back pain seems to begin worse. What made patient finally come in was she is starting having more pain going down the left leg. Patient states it is mostly on the posterior aspect the leg and wraps around to the anterior lower partly ache to her foot. Patient states that there is no weakness but she feels unstable. Patient has difficulty with lifting with her job secondary to this pain. Worse with activity and better with rest. Patient feels that her big toe is numb all the time. Has not responded to any other medications at this time.     Past medical history, social, surgical and family history all reviewed in electronic medical record.   Review of Systems: No headache, visual changes, nausea, vomiting, diarrhea, constipation, dizziness, abdominal pain, skin rash, fevers, chills, night sweats, weight loss, swollen lymph nodes, body aches, joint swelling, muscle aches, chest pain, shortness of breath, mood changes.   Objective Blood pressure 108/76, pulse 70, height 5\' 6"  (1.676 m), weight 248 lb (112.492 kg).  General: No apparent distress alert and oriented x3 mood and affect normal,  dressed appropriately.  HEENT: Pupils equal, extraocular movements intact  Respiratory: Patient's speak in full sentences and does not appear short of breath  Cardiovascular: No lower extremity edema, non tender, no erythema  Skin: Warm dry intact with no signs of infection or rash on extremities or on axial skeleton.  Abdomen: Soft nontender  Neuro: Cranial nerves II through XII are intact, neurovascularly intact in all extremities with 2+ DTRs and 2+ pulses.  Lymph: No lymphadenopathy of posterior or anterior cervical chain or axillae bilaterally.  Gait antalgic gait MSK:  Non tender with full range of motion and good stability and symmetric strength and tone of shoulders, elbows, wrist, hip, knee and ankles bilaterally.  Back Exam:  Inspection: Unremarkable  Motion: Flexion 35 deg, Extension 35 deg, Side Bending to 35 deg bilaterally,  Rotation to 35 deg bilaterally  SLR laying: Negative  XSLR laying: Negative  Palpable tenderness: Tender over the left greater trochanteric area as well as left SI joint. FABER: Positive left Sensory change: Gross sensation intact to all lumbar and sacral dermatomes.  Reflexes: 2+ at both patellar tendons, 2+ at achilles tendons, Babinski's downgoing.  Strength at foot  Plantar-flexion: 5/5 Dorsi-flexion: 5/5 Eversion: 5/5 Inversion: 5/5  Leg strength  Quad: 5/5 Hamstring: 5/5 Hip flexor: 5/5 Hip abductors: 4/5     Impression and Recommendations:     This case required medical decision making of moderate complexity.

## 2014-03-22 NOTE — Patient Instructions (Addendum)
Good to see you.  Happy holidays!  Ice 20 minutes 2 times daily. Usually after activity and before bed.  Vitamin D 2000 IU daily  Turmeric 500mg  twice daily.  Prednisone 50mg  daily for 5 days.  Gabapentin 100mg  at night for first 3 nights then increase to 200mg  at night thereafter.  Exercises 3 times a week. Alternate back and hip Come back in 1-2 weeks. If not better we will try injection in the hip and consider further imaging.

## 2014-03-22 NOTE — Assessment & Plan Note (Signed)
Mild concern the patient's back pain is actually secondary to lumbar radiculopathy. Patient does state that the area that is of concern does correspond well with an S1 nerve root compression. On exam today though patient has a negative straight leg test as well as completely neurovascularly intact distally with good deep tendon reflexes. Patient did have more of an antalgic gait today and we discussed with patient about over-the-counter natural some limitations a could be beneficial. Due to this pain I am going to give patient prednisone 59 g daily for the next 5 days as well as start patient on gabapentin. We will see if this makes any improvement. We will avoid any chronic pain medications and her. Patient come back in 1-2 weeks. At that time if continuing to have pain further imaging may be necessary. Patient also is having some greater trochanteric bursitis and may need an injection at follow-up.

## 2014-04-03 ENCOUNTER — Ambulatory Visit: Payer: Self-pay | Admitting: Family Medicine

## 2015-01-02 ENCOUNTER — Ambulatory Visit (INDEPENDENT_AMBULATORY_CARE_PROVIDER_SITE_OTHER): Payer: BLUE CROSS/BLUE SHIELD | Admitting: Endocrinology

## 2015-01-02 ENCOUNTER — Encounter: Payer: Self-pay | Admitting: Endocrinology

## 2015-01-02 VITALS — BP 132/74 | HR 85 | Temp 98.9°F | Ht 66.0 in | Wt 241.0 lb

## 2015-01-02 DIAGNOSIS — E890 Postprocedural hypothyroidism: Secondary | ICD-10-CM

## 2015-01-02 LAB — T4, FREE: Free T4: 0.49 ng/dL — ABNORMAL LOW (ref 0.60–1.60)

## 2015-01-02 LAB — TSH: TSH: 7.32 u[IU]/mL — AB (ref 0.35–4.50)

## 2015-01-02 MED ORDER — LEVOTHYROXINE SODIUM 125 MCG PO TABS: 125.0000 ug | ORAL_TABLET | Freq: Every day | ORAL | Status: DC

## 2015-01-02 NOTE — Progress Notes (Signed)
Subjective:    Patient ID: Kimberly Hanson, female    DOB: October 01, 1965, 49 y.o.   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  pt states she feels well in general, except for fatigue.  Pt says she never misses the synthroid. Past Medical History  Diagnosis Date  . Hyperthyroidism   . Seizures   . Multinodular goiter   . Diverticulosis     On CT 06/26/13  . Cholelithiasis     On CT 06/26/13    Past Surgical History  Procedure Laterality Date  . Abdominal hysterectomy  2002  . Cesarean section      x 2  . Colonoscopy N/A 07/04/2013    Procedure: COLONOSCOPY;  Surgeon: Milus Banister, MD;  Location: [facility];  Service: Endoscopy;  Laterality: N/A;    Social History   Social History  . Marital Status: Single    Spouse Name: N/A  . Number of Children: 2  . Years of Education: 12+   Occupational History  . CNA    Social History Main Topics  . Smoking status: Current Every Day Smoker -- 0.25 packs/day for 5 years    Types: Cigarettes  . Smokeless tobacco: Never Used     Comment: tobacco info given 06/29/13  . Alcohol Use: Yes     Comment: occasional  . Drug Use: No  . Sexual Activity: Yes   Other Topics Concern  . Not on file   Social History Narrative   Regular exercise-no   Caffeine Use-yes    Current Outpatient Prescriptions on File Prior to Visit  Medication Sig Dispense Refill  . acetaminophen (TYLENOL) 500 MG tablet Take 1,000 mg by mouth every 6 (six) hours as needed for mild pain.    Marland Kitchen albuterol (PROVENTIL HFA;VENTOLIN HFA) 108 (90 BASE) MCG/ACT inhaler Inhale 1-2 puffs into the lungs every 6  (six) hours as needed for wheezing or shortness of breath. 1 Inhaler 0  . lidocaine (XYLOCAINE) 2 % solution Use as directed 20 mLs in the mouth or throat as needed for mouth pain. 100 mL 0  . predniSONE (DELTASONE) 50 MG tablet Take 1 tablet (50 mg total) by mouth daily. 5 tablet 0   No current facility-administered medications on file prior to visit.    Allergies  Allergen Reactions  . Naproxen Anaphylaxis    Family History  Problem Relation Age of Onset  . Hypertension Mother   . Colon cancer Neg Hx   . Colon polyps Mother     BP 132/74 mmHg  Pulse 85  Temp(Src) 98.9 F (37.2 C) (Oral)  Ht 5\' 6"  (1.676 m)  Wt 241 lb (109.317 kg)  BMI 38.92 kg/m2  SpO2 98%  Review of Systems Denies weight change    Objective:   Physical Exam VITAL SIGNS:  See vs page GENERAL: no distress NECK: thyroid is 2-3 time normal size, with a multinodular surface.  i cannot palpate any nodule.    Lab Results  Component Value Date   TSH 7.32* 01/02/2015       Assessment & Plan:  Hypothyroidism: she needs increased  rx.  Patient is advised the following: Patient Instructions  A thyroid blood test is requested for you today.  We'll let you know about the results. most of the time, a "lumpy thyroid" will eventually become overactive again.  this is usually a slow process, happening over the span of many years.  It is less possible that it could become more underactive, but it could happen.   Please come back for a follow-up appointment in 4 months.       Addendum: increase synthroid to 125/d Recheck TSH in 1 month.

## 2015-01-02 NOTE — Patient Instructions (Addendum)
A thyroid blood test is requested for you today.  We'll let you know about the results. most of the time, a "lumpy thyroid" will eventually become overactive again.  this is usually a slow process, happening over the span of many years.  It is less possible that it could become more underactive, but it could happen.   Please come back for a follow-up appointment in 4 months.

## 2015-04-09 ENCOUNTER — Other Ambulatory Visit: Payer: Self-pay | Admitting: Endocrinology

## 2015-04-09 ENCOUNTER — Other Ambulatory Visit (INDEPENDENT_AMBULATORY_CARE_PROVIDER_SITE_OTHER): Payer: BLUE CROSS/BLUE SHIELD

## 2015-04-09 DIAGNOSIS — E890 Postprocedural hypothyroidism: Secondary | ICD-10-CM | POA: Diagnosis not present

## 2015-04-09 LAB — TSH: TSH: 23.71 u[IU]/mL — ABNORMAL HIGH (ref 0.35–4.50)

## 2015-04-09 MED ORDER — LEVOTHYROXINE SODIUM 150 MCG PO TABS: 150.0000 ug | ORAL_TABLET | Freq: Every day | ORAL | Status: DC

## 2015-05-05 ENCOUNTER — Ambulatory Visit (INDEPENDENT_AMBULATORY_CARE_PROVIDER_SITE_OTHER): Payer: BLUE CROSS/BLUE SHIELD | Admitting: Endocrinology

## 2015-05-05 ENCOUNTER — Encounter: Payer: Self-pay | Admitting: Endocrinology

## 2015-05-05 VITALS — BP 128/74 | HR 89 | Temp 98.2°F | Ht 66.0 in | Wt 233.0 lb

## 2015-05-05 DIAGNOSIS — E890 Postprocedural hypothyroidism: Secondary | ICD-10-CM | POA: Diagnosis not present

## 2015-05-05 LAB — TSH: TSH: 3.67 u[IU]/mL (ref 0.35–4.50)

## 2015-05-05 NOTE — Progress Notes (Signed)
Subjective:    Patient ID: Kimberly Hanson, female    DOB: 1965/05/24, 50 y.o.   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  pt states she feels well in general, except for deeper voice.  Pt says she never misses the synthroid.  Past Medical History  Diagnosis Date  . Hyperthyroidism   . Seizures ([place])   . Multinodular goiter   . Diverticulosis     On CT 06/26/13  . Cholelithiasis     On CT 06/26/13    Past Surgical History  Procedure Laterality Date  . Abdominal hysterectomy  2002  . Cesarean section      x 2  . Colonoscopy N/A 07/04/2013    Procedure: COLONOSCOPY;  Surgeon: Milus Banister, MD;  Location: [facility];  Service: Endoscopy;  Laterality: N/A;    Social History   Social History  . Marital Status: Single    Spouse Name: N/A  . Number of Children: 2  . Years of Education: 12+   Occupational History  . CNA    Social History Main Topics  . Smoking status: Current Every Day Smoker -- 0.25 packs/day for 5 years    Types: Cigarettes  . Smokeless tobacco: Never Used     Comment: tobacco info given 06/29/13  . Alcohol Use: Yes     Comment: occasional  . Drug Use: No  . Sexual Activity: Yes   Other Topics Concern  . Not on file   Social History Narrative   Regular exercise-no   Caffeine Use-yes    Current Outpatient Prescriptions on File Prior to Visit  Medication Sig Dispense Refill  . acetaminophen (TYLENOL) 500 MG tablet Take 1,000 mg by mouth every 6 (six) hours as needed for mild pain.    Marland Kitchen albuterol (PROVENTIL HFA;VENTOLIN HFA) 108 (90 BASE) MCG/ACT inhaler Inhale 1-2 puffs into the  lungs every 6 (six) hours as needed for wheezing or shortness of breath. 1 Inhaler 0  . levothyroxine (SYNTHROID, LEVOTHROID) 150 MCG tablet Take 1 tablet (150 mcg total) by mouth daily before breakfast. 30 tablet 2   No current facility-administered medications on file prior to visit.    Allergies  Allergen Reactions  . Naproxen Anaphylaxis    Family History  Problem Relation Age of Onset  . Hypertension Mother   . Colon cancer Neg Hx   . Colon polyps Mother     BP 128/74 mmHg  Pulse 89  Temp(Src) 98.2 F (36.8 C) (Oral)  Ht 5\' 6"  (1.676 m)  Wt 233 lb (105.688 kg)  BMI 37.63 kg/m2  SpO2 98%   Review of Systems No weight change    Objective:   Physical Exam VITAL SIGNS:  See vs page. GENERAL: no distress.  NECK: thyroid is 2-3 times normal size, with a multinodular surface, but I cannot palpate any nodule.    Lab Results  Component Value Date   TSH 3.67 05/05/2015       Assessment & Plan:  Post-I-131 hypothyroidism, well-replaced.  Patient is advised the following: Patient Instructions  A thyroid blood test is requested for you today.  We'll let you know about the results. most of the time, a "lumpy thyroid" will eventually become overactive again.  this is usually a slow process, happening over the span of many years.  It is less possible that it could become more underactive, but it could happen.   Please come back for a follow-up appointment in 4 months.

## 2015-05-05 NOTE — Patient Instructions (Signed)
A thyroid blood test is requested for you today.  We'll let you know about the results. most of the time, a "lumpy thyroid" will eventually become overactive again.  this is usually a slow process, happening over the span of many years.  It is less possible that it could become more underactive, but it could happen.   Please come back for a follow-up appointment in 4 months.     

## 2015-07-21 ENCOUNTER — Other Ambulatory Visit: Payer: Self-pay | Admitting: Endocrinology

## 2015-08-23 ENCOUNTER — Other Ambulatory Visit: Payer: Self-pay | Admitting: Endocrinology

## 2015-09-02 ENCOUNTER — Ambulatory Visit: Payer: BLUE CROSS/BLUE SHIELD | Admitting: Endocrinology

## 2015-09-03 ENCOUNTER — Ambulatory Visit: Payer: BLUE CROSS/BLUE SHIELD | Admitting: Endocrinology

## 2015-09-03 DIAGNOSIS — Z0289 Encounter for other administrative examinations: Secondary | ICD-10-CM

## 2015-09-04 ENCOUNTER — Telehealth: Payer: Self-pay | Admitting: Endocrinology

## 2015-09-04 NOTE — Telephone Encounter (Signed)
LVM for PT to call back and r/s

## 2015-09-04 NOTE — Telephone Encounter (Signed)
Kimberly Hanson, Could you please contact the pt to reschedule, Thanks!

## 2015-09-04 NOTE — Telephone Encounter (Signed)
Patient no showed today's appt. Please advise on how to follow up. °A. No follow up necessary. °B. Follow up urgent. Contact patient immediately. °C. Follow up necessary. Contact patient and schedule visit in ___ days. °D. Follow up advised. Contact patient and schedule visit in ____weeks. ° °

## 2015-09-04 NOTE — Telephone Encounter (Signed)
Please come back for a follow-up appointment in 3 months 

## 2015-10-21 ENCOUNTER — Other Ambulatory Visit: Payer: Self-pay | Admitting: Endocrinology

## 2015-11-23 ENCOUNTER — Other Ambulatory Visit: Payer: Self-pay | Admitting: Endocrinology

## 2015-11-23 NOTE — Telephone Encounter (Signed)
Please refill x 1 Ov is due  

## 2015-12-25 ENCOUNTER — Other Ambulatory Visit: Payer: Self-pay | Admitting: Endocrinology

## 2015-12-25 NOTE — Telephone Encounter (Signed)
Please refill x 1 Ov is due  

## 2016-01-19 ENCOUNTER — Encounter: Payer: Self-pay | Admitting: Physical Therapy

## 2016-01-19 ENCOUNTER — Ambulatory Visit: Payer: BLUE CROSS/BLUE SHIELD | Attending: Internal Medicine | Admitting: Physical Therapy

## 2016-01-19 DIAGNOSIS — R262 Difficulty in walking, not elsewhere classified: Secondary | ICD-10-CM

## 2016-01-19 DIAGNOSIS — M62830 Muscle spasm of back: Secondary | ICD-10-CM

## 2016-01-19 DIAGNOSIS — M5442 Lumbago with sciatica, left side: Secondary | ICD-10-CM

## 2016-01-19 DIAGNOSIS — M6281 Muscle weakness (generalized): Secondary | ICD-10-CM

## 2016-01-20 ENCOUNTER — Encounter: Payer: Self-pay | Admitting: Physical Therapy

## 2016-01-20 NOTE — Therapy (Signed)
[facility] [place], [place], [address] Phone: [phone]   Fax:  [phone]  Physical Therapy Evaluation  Patient Details  Name: Kimberly Hanson MRN: [id number] Date of Birth: May 17, 1965 Referring Provider: Dr Gala Romney   Encounter Date: 01/19/2016      PT End of Session - 01/20/16 1805    Visit Number 1   Number of Visits 16   Date for PT Re-Evaluation 03/16/16   Authorization Type [employer] 30% co-insurance    PT Start Time 1415   PT Stop Time 1500   PT Time Calculation (min) 45 min   Activity Tolerance Patient tolerated treatment well   Behavior During Therapy [facility] for tasks assessed/performed      Past Medical History:  Diagnosis Date  . Cholelithiasis    On CT 06/26/13  . Diverticulosis    On CT 06/26/13  . Hyperthyroidism   . Multinodular goiter   . Seizures ([place])     Past Surgical History:  Procedure Laterality Date  . ABDOMINAL HYSTERECTOMY  2002  . CESAREAN SECTION     x 2  . COLONOSCOPY N/A 07/04/2013   Procedure: COLONOSCOPY;  Surgeon: Milus Banister, MD;  Location: [facility];  Service: Endoscopy;  Laterality: N/A;    There were no vitals filed for this visit.       Subjective Assessment - 01/19/16 1657    Subjective Patient reports a fall about a year and a half ago. Since that point she has been having increasing pain as time has gone on. She is now having pain that is going down into her left foot. She is having difficulty standing on her left lower extremity. She has pain with flexion and extension. She works as a Marine scientist. She is having pain at work. She feels like she can not stand up straight,    Pertinent History Nothing at this time    Limitations Standing;Walking;Sitting   How long can you sit comfortably? < 40 min    How long can you stand comfortably? < 10 min    How long can you walk comfortably? limited distances before pain begins    Diagnostic tests X-ray: L4-L5 L5 -S1  spondylosis    Patient Stated Goals to have less pain when working    Currently in Pain? Yes   Pain Score 8    Pain Location Back   Pain Orientation Left   Pain Descriptors / Indicators Aching;Shooting   Pain Type Acute pain   Pain Radiating Towards towards left foot    Pain Onset 1 to 4 weeks ago   Pain Frequency Constant   Aggravating Factors  standing, walking, sitting, night time    Pain Relieving Factors rest, stretching    Effect of Pain on Daily Activities difficulty working             [facility] PT Assessment - 01/20/16 0001      Assessment   Medical Diagnosis L lower back pain L sciatica    Referring Provider Dr Gala Romney    Hand Dominance Right   Next MD Visit 3 weeks      Precautions   Precautions None     Restrictions   Weight Bearing Restrictions No     Balance Screen   Has the patient fallen in the past 6 months No     [place] residence     Prior Function   Level of Independence Independent  Cognition   Overall Cognitive Status Within Functional Limits for tasks assessed   Attention Focused   Focused Attention Appears intact   Memory Appears intact   Awareness Appears intact   Problem Solving Appears intact     Observation/Other Assessments   Observations Does not weight bear on the left lower extremity in standing    Focus on Therapeutic Outcomes (FOTO)  59% limitation      Sensation   Additional Comments Numbness into the Left lateral thigh      Coordination   Gross Motor Movements are Fluid and Coordinated Yes     AROM   Lumbar Flexion limited 25% but reporduced tingling into her left foot    Lumbar Extension Limited 50% with increased pain    Lumbar - Right Side Bend WNL no pain   Lumbar - Left Side Bend limited 25% with pain    Lumbar - Right Rotation WNL no pain      PROM   Overall PROM Comments Pain with left hip external rotation      Strength   Right Hip Flexion 5/5   Right Hip  Extension 5/5   Right Hip ABduction 5/5   Right Hip ADduction 5/5   Left Hip Flexion 3+/5   Left Hip Extension 3+/5   Left Hip ABduction 3+/5   Left Hip ADduction 4+/5   Right Knee Flexion 5/5   Right Knee Extension 5/5   Left Knee Flexion 4+/5   Left Knee Extension 4+/5     Flexibility   Soft Tissue Assessment /Muscle Length yes   Hamstrings Lacking 20 degrees on the left      Palpation   Palpation comment Significant spasming of the left uppoer glut and lower lumbar paraspinals.      Special Tests    Special Tests --  (+) SLR at 30 degrees                    [facility] Adult PT Treatment/Exercise - 01/20/16 0001      Ambulation/Gait   Gait Comments Limited stride length bilateral. Decreased left hip flexion. Decreased single leg stance time on the right.      Posture/Postural Control   Posture Comments Stands with a flexed posture      Lumbar Exercises: Stretches   Single Knee to Chest Stretch Limitations 2x20sec    Lower Trunk Rotation Limitations x10    Piriformis Stretch Limitations 2x20sec      Manual Therapy   Manual therapy comments Manual trigger point release into the upper glut and lower lumbar spine; Long axis distraction in slight abduction to improve movement. Improved pain.                    PT Short Term Goals - 01/20/16 1747      PT SHORT TERM GOAL #1   Title Patient will demsotrate a good core contraction    Time 2   Period Weeks   Status New     PT SHORT TERM GOAL #2   Title Patient will report 2/10 pain at worst    Time 2   Period Weeks   Status New     PT SHORT TERM GOAL #3   Title Patient will increase L LE strength to 4+/5    Time 4   Period Weeks   Status New     PT SHORT TERM GOAL #4   Title Patient will be independent with intial program for core stregthening  Time 4   Period Weeks   Status New     PT SHORT TERM GOAL #5   Title Patient will report cetralized back pain with no radicular symptoms going  down the back   Time 4   Period Weeks   Status New           PT Long Term Goals - 01/20/16 1749      PT LONG TERM GOAL #1   Title Patient will stand at work for 1 hour without increased back pain    Time 4   Period Weeks   Status New     PT LONG TERM GOAL #2   Title Patient will stand on with equal weight on her left and right legs without increased back pain    Time 4   Period Weeks   Status New     PT LONG TERM GOAL #3   Title Patient will ambulate 3000' without increased pain in order to go shopping    Time 4   Period Weeks   Status New     PT LONG TERM GOAL #4   Title Patient will demonstrate a 43% deficit on FOTO    Time 8   Period Weeks   Status New               Plan - 01/20/16 0746    Clinical Impression Statement Patient is a 50 year old female with lumbar spine pain that radiates down to her left foot. It has been becoming worse over time. She appears to hav a left pelvic rotation. She does not feel like she can put full weight on her left lower extremity in standing. She has limited lumbar extension. Her x-ray shows spondylosis at L4L5 and L5 S-1. She also has radicular symptoms with forward bending. She has significant L LE weakness. She would benefit from skilled therapy to improve her strength and stability of her lumbar spine. She also shows signs of trochanteric bursitis on the left side. She was seen for a low complexity evaluation.    Rehab Potential Good   PT Frequency 2x / week   PT Duration 8 weeks   PT Treatment/Interventions ADLs/Self Care Home Management;Cryotherapy;Electrical Stimulation;Iontophoresis 4mg /ml Dexamethasone;Moist Heat;Ultrasound;Stair training;Functional mobility training;Patient/family education;Neuromuscular re-education;Therapeutic exercise;Therapeutic activities;Manual techniques;Passive range of motion;Dry needling;Taping   PT Next Visit Plan Continue with manual triggger point release and joint mobilization to improve  lumbar extension. Add light core and hip strengthening. Consider supine hip flexion, supine clam shell, Standing left leg march, supine heel slide, posterior pelvic tilt.    PT Home Exercise Plan LTR, piriformis stretch, single knee to chest stretch.    Consulted and Agree with Plan of Care Patient      Patient will benefit from skilled therapeutic intervention in order to improve the following deficits and impairments:  Abnormal gait, Decreased activity tolerance, Decreased strength, Decreased mobility, Impaired UE functional use, Increased muscle spasms, Pain, Impaired sensation  Visit Diagnosis: Acute left-sided low back pain with left-sided sciatica - Plan: PT plan of care cert/re-cert  Muscle spasm of back - Plan: PT plan of care cert/re-cert  Muscle weakness (generalized) - Plan: PT plan of care cert/re-cert  Difficulty in walking, not elsewhere classified - Plan: PT plan of care cert/re-cert     Problem List Patient Active Problem List   Diagnosis Date Noted  . Lumbar radiculopathy 03/22/2014  . Greater trochanteric bursitis of left hip 03/22/2014  . Benign neoplasm of colon 07/04/2013  . Diverticulosis  of colon (without mention of hemorrhage) 07/04/2013  . External hemorrhoids without mention of complication [id number]  . Hypothyroidism following radioiodine therapy 06/08/2013  . Unspecified constipation 09/19/2012  . Sciatica neuralgia 09/06/2012  . Multinodular goiter 07/13/2012    Carney Living PT DPT  01/20/2016, 6:10 PM  [facility] [address] [place], [place], [address] Phone: [phone]   Fax:  [phone]  Name: Kimberly Hanson MRN: [id number] Date of Birth: Jan 11, 1966

## 2016-01-26 ENCOUNTER — Ambulatory Visit: Payer: BLUE CROSS/BLUE SHIELD | Admitting: Physical Therapy

## 2016-01-28 ENCOUNTER — Ambulatory Visit: Payer: BLUE CROSS/BLUE SHIELD | Admitting: Physical Therapy

## 2016-01-28 DIAGNOSIS — M62830 Muscle spasm of back: Secondary | ICD-10-CM

## 2016-01-28 DIAGNOSIS — M6281 Muscle weakness (generalized): Secondary | ICD-10-CM

## 2016-01-28 DIAGNOSIS — R262 Difficulty in walking, not elsewhere classified: Secondary | ICD-10-CM

## 2016-01-28 DIAGNOSIS — M5442 Lumbago with sciatica, left side: Secondary | ICD-10-CM

## 2016-01-28 NOTE — Therapy (Signed)
[place], [place], [address] Phone: [phone]   Fax:  [phone]  Physical Therapy Treatment  Patient Details  Name: Kimberly Hanson MRN: [id number] Date of Birth: 06-Sep-1965 Referring Provider: Dr Gala Romney   Encounter Date: 01/28/2016      PT End of Session - 01/28/16 1343    Visit Number 2   Number of Visits 16   Date for PT Re-Evaluation 03/16/16   Authorization Type [employer] 30% co-insurance    PT Start Time 1100   PT Stop Time 1151   PT Time Calculation (min) 51 min   Activity Tolerance Patient tolerated treatment well   Behavior During Therapy [facility] for tasks assessed/performed      Past Medical History:  Diagnosis Date  . Cholelithiasis    On CT 06/26/13  . Diverticulosis    On CT 06/26/13  . Hyperthyroidism   . Multinodular goiter   . Seizures ([place])     Past Surgical History:  Procedure Laterality Date  . ABDOMINAL HYSTERECTOMY  2002  . CESAREAN SECTION     x 2  . COLONOSCOPY N/A 07/04/2013   Procedure: COLONOSCOPY;  Surgeon: Milus Banister, MD;  Location: [facility];  Service: Endoscopy;  Laterality: N/A;    There were no vitals filed for this visit.      Subjective Assessment - 01/28/16 1338    Subjective Patient reports some improvement in her pain in the morning. She reports the stretches help as well. she had very little pain after her initial visit but it did not last. She is still having pain into her left foot.    Pertinent History Nothing at this time    Limitations Standing;Walking;Sitting   How long can you sit comfortably? < 40 min    How long can you stand comfortably? < 10 min    How long can you walk comfortably? limited distances before pain begins    Diagnostic tests X-ray: L4-L5 L5 -S1 spondylosis    Patient Stated Goals to have less pain when working    Currently in Pain? Yes   Pain Score 6    Pain Location Back   Pain Orientation Left   Pain  Descriptors / Indicators Aching;Shooting   Pain Type Acute pain   Pain Radiating Towards into left foot    Pain Onset 1 to 4 weeks ago   Pain Frequency Constant   Aggravating Factors  standing, walking, night    Pain Relieving Factors rest, stretches    Effect of Pain on Daily Activities difficulty working    Multiple Pain Sites No                         [facility] Adult PT Treatment/Exercise - 01/28/16 0001      Lumbar Exercises: Stretches   Passive Hamstring Stretch Limitations 2x20 sec bilateral    Single Knee to Chest Stretch Limitations 2x20sec    Lower Trunk Rotation Limitations x10    Piriformis Stretch Limitations 2x20sec      Lumbar Exercises: Supine   AB Set Limitations with cuing for light pelvic tilt x10    Clam Limitations red 2x10    Bent Knee Raise Limitations 2x10      Modalities   Modalities Moist Heat     Moist Heat Therapy   Number Minutes Moist Heat 10 Minutes   Moist Heat Location Lumbar Spine     Manual Therapy   Manual therapy  comments Manual long axis distraction with grade 4 mobilization; postrior rotation of left inominant MET with quad muscle; Soft tissue mobilization to the lumbar spine.                 PT Education - 01/28/16 1343    Education provided Yes   Education Details symptom mangement, updated HEP.    Person(s) Educated Patient   Methods Explanation;Demonstration;Handout   Comprehension Verbalized understanding;Returned demonstration;Need further instruction          PT Short Term Goals - 01/28/16 1358      PT SHORT TERM GOAL #1   Title Patient will demsotrate a good core contraction    Baseline good with cuing assess carryover next visit    Time 2   Period Weeks   Status On-going     PT SHORT TERM GOAL #2   Title Patient will report 2/10 pain at worst    Baseline still 6/10    Time 4   Status On-going     PT SHORT TERM GOAL #3   Title Patient will increase L LE strength to 4+/5    Time 4    Period Weeks   Status On-going     PT SHORT TERM GOAL #4   Title Patient will be independent with intial program for core stregthening    Time 4   Period Weeks   Status On-going     PT SHORT TERM GOAL #5   Title Patient will report cetralized back pain with no radicular symptoms going down the back   Baseline still having pain into the left foot    Time 4   Period Weeks   Status On-going           PT Long Term Goals - 01/20/16 1749      PT LONG TERM GOAL #1   Title Patient will stand at work for 1 hour without increased back pain    Time 4   Period Weeks   Status New     PT LONG TERM GOAL #2   Title Patient will stand on with equal weight on her left and right legs without increased back pain    Time 4   Period Weeks   Status New     PT LONG TERM GOAL #3   Title Patient will ambulate 3000' without increased pain in order to go shopping    Time 4   Period Weeks   Status New     PT LONG TERM GOAL #4   Title Patient will demonstrate a 43% deficit on FOTO    Time 8   Period Weeks   Status New               Plan - 01/28/16 1356    Clinical Impression Statement Good tolerance to exercises. Some pain noted with MET for rotation. Improved pain with manual therapy. Less pain with palpation of the lumbar spine. Patient given light glut and hip fleor stregthening with abdominal bracing. No increase in pain noted.    Rehab Potential Good   PT Frequency 2x / week   PT Treatment/Interventions ADLs/Self Care Home Management;Cryotherapy;Electrical Stimulation;Iontophoresis 54m/ml Dexamethasone;Moist Heat;Ultrasound;Stair training;Functional mobility training;Patient/family education;Neuromuscular re-education;Therapeutic exercise;Therapeutic activities;Manual techniques;Passive range of motion;Dry needling;Taping   PT Next Visit Plan Continue with manual triggger point release and joint mobilization to improve lumbar extension. Add light core and hip strengthening.  Consider supine hip flexion, supine clam shell, Standing left leg march, supine heel slide, posterior pelvic  tilt.    PT Home Exercise Plan LTR, piriformis stretch, single knee to chest stretch.    Consulted and Agree with Plan of Care Patient      Patient will benefit from skilled therapeutic intervention in order to improve the following deficits and impairments:  Abnormal gait, Decreased activity tolerance, Decreased strength, Decreased mobility, Impaired UE functional use, Increased muscle spasms, Pain, Impaired sensation  Visit Diagnosis: Acute left-sided low back pain with left-sided sciatica  Muscle spasm of back  Muscle weakness (generalized)  Difficulty in walking, not elsewhere classified     Problem List Patient Active Problem List   Diagnosis Date Noted  . Lumbar radiculopathy 03/22/2014  . Greater trochanteric bursitis of left hip 03/22/2014  . Benign neoplasm of colon 07/04/2013  . Diverticulosis of colon (without mention of hemorrhage) 07/04/2013  . External hemorrhoids without mention of complication 99/27/8004  . Hypothyroidism following radioiodine therapy 06/08/2013  . Unspecified constipation 09/19/2012  . Sciatica neuralgia 09/06/2012  . Multinodular goiter 07/13/2012    Carney Living PT DPT  01/28/2016, 2:02 PM  [facility] [address] [place], [place], [address] Phone: [phone]   Fax:  [phone]  Name: Kimberly Hanson MRN: [id number] Date of Birth: 26-Sep-1965

## 2016-02-02 ENCOUNTER — Ambulatory Visit: Payer: BLUE CROSS/BLUE SHIELD | Admitting: Physical Therapy

## 2016-02-02 DIAGNOSIS — M5442 Lumbago with sciatica, left side: Secondary | ICD-10-CM

## 2016-02-02 DIAGNOSIS — M6281 Muscle weakness (generalized): Secondary | ICD-10-CM

## 2016-02-02 DIAGNOSIS — M62830 Muscle spasm of back: Secondary | ICD-10-CM

## 2016-02-02 DIAGNOSIS — R262 Difficulty in walking, not elsewhere classified: Secondary | ICD-10-CM

## 2016-02-02 NOTE — Therapy (Signed)
[place], [place], [address] Phone: [phone]   Fax:  [phone]  Physical Therapy Treatment  Patient Details  Name: Kimberly Hanson MRN: [id number] Date of Birth: 07-Jul-1965 Referring Provider: Dr Gala Romney   Encounter Date: 02/02/2016      PT End of Session - 02/02/16 1028    Visit Number 3   Number of Visits 16   Date for PT Re-Evaluation 03/16/16   Authorization Type [employer] 30% co-insurance    PT Start Time 1015   PT Stop Time 1106   PT Time Calculation (min) 51 min   Activity Tolerance Patient tolerated treatment well   Behavior During Therapy [facility] for tasks assessed/performed      Past Medical History:  Diagnosis Date  . Cholelithiasis    On CT 06/26/13  . Diverticulosis    On CT 06/26/13  . Hyperthyroidism   . Multinodular goiter   . Seizures ([place])     Past Surgical History:  Procedure Laterality Date  . ABDOMINAL HYSTERECTOMY  2002  . CESAREAN SECTION     x 2  . COLONOSCOPY N/A 07/04/2013   Procedure: COLONOSCOPY;  Surgeon: Milus Banister, MD;  Location: [facility];  Service: Endoscopy;  Laterality: N/A;    There were no vitals filed for this visit.      Subjective Assessment - 02/02/16 1018    Subjective Patient today she is having significant pain shooting down into her left leg. She continues to work. She is still doing a lot of bending. She has been very stiff in the mornings.    Limitations Standing;Walking;Sitting   How long can you sit comfortably? < 40 min    How long can you stand comfortably? < 10 min    How long can you walk comfortably? limited distances before pain begins    Diagnostic tests X-ray: L4-L5 L5 -S1 spondylosis    Patient Stated Goals to have less pain when working    Currently in Pain? Yes   Pain Score 6    Pain Location Back   Pain Orientation Left   Pain Descriptors / Indicators Aching   Pain Type Acute pain   Pain Onset 1 to 4 weeks ago   Pain Frequency Constant   Aggravating Factors  Standing, walking, pain at night    Pain Relieving Factors rest, stretches    Effect of Pain on Daily Activities difficulty working                          [facility] Adult PT Treatment/Exercise - 02/02/16 0001      Lumbar Exercises: Stretches   Passive Hamstring Stretch Limitations 2x20 sec bilateral    Single Knee to Chest Stretch Limitations 2x20sec    Lower Trunk Rotation Limitations x10    Piriformis Stretch Limitations 2x20sec      Lumbar Exercises: Supine   AB Set Limitations with cuing for light pelvic tilt x10    Clam Limitations red 2x10    Bent Knee Raise Limitations 2x10    Other Supine Lumbar Exercises supine shoulder flexion with yellow ball 2x10;  shoulder rotation with yellow ball 2x10      Modalities   Modalities Moist Heat     Moist Heat Therapy   Number Minutes Moist Heat 10 Minutes   Moist Heat Location Lumbar Spine     Manual Therapy   Manual therapy comments Manual long axis distraction with grade 4 mobilization;  posterior rotation of left inominant MET with quad muscle; Soft tissue mobilization to the lumbar spine. IASTM to lumbar spine                 PT Education - 02/02/16 1028    Education provided Yes   Education Details educated on the improtance of core stregthening    Person(s) Educated Patient   Methods Explanation;Demonstration;Handout   Comprehension Verbalized understanding;Returned demonstration;Need further instruction          PT Short Term Goals - 01/28/16 1358      PT SHORT TERM GOAL #1   Title Patient will demsotrate a good core contraction    Baseline good with cuing assess carryover next visit    Time 2   Period Weeks   Status On-going     PT SHORT TERM GOAL #2   Title Patient will report 2/10 pain at worst    Baseline still 6/10    Time 4   Status On-going     PT SHORT TERM GOAL #3   Title Patient will increase L LE strength to 4+/5    Time 4    Period Weeks   Status On-going     PT SHORT TERM GOAL #4   Title Patient will be independent with intial program for core stregthening    Time 4   Period Weeks   Status On-going     PT SHORT TERM GOAL #5   Title Patient will report cetralized back pain with no radicular symptoms going down the back   Baseline still having pain into the left foot    Time 4   Period Weeks   Status On-going           PT Long Term Goals - 01/20/16 1749      PT LONG TERM GOAL #1   Title Patient will stand at work for 1 hour without increased back pain    Time 4   Period Weeks   Status New     PT LONG TERM GOAL #2   Title Patient will stand on with equal weight on her left and right legs without increased back pain    Time 4   Period Weeks   Status New     PT LONG TERM GOAL #3   Title Patient will ambulate 3000' without increased pain in order to go shopping    Time 4   Period Weeks   Status New     PT LONG TERM GOAL #4   Title Patient will demonstrate a 43% deficit on FOTO    Time 8   Period Weeks   Status New               Plan - 02/02/16 1532    Clinical Impression Statement Patient reported isignificant improvement after MET. She reported improved pain with manual trigger point release as well. She tolerated exercises well. She has some pain with porlonged positioning    Rehab Potential Good   PT Frequency 2x / week   PT Duration 8 weeks   PT Treatment/Interventions ADLs/Self Care Home Management;Cryotherapy;Electrical Stimulation;Iontophoresis 64m/ml Dexamethasone;Moist Heat;Ultrasound;Stair training;Functional mobility training;Patient/family education;Neuromuscular re-education;Therapeutic exercise;Therapeutic activities;Manual techniques;Passive range of motion;Dry needling;Taping   PT Next Visit Plan Continue with manual triggger point release and joint mobilization to improve lumbar extension. Add light core and hip strengthening. Consider supine hip flexion, supine  clam shell, Standing left leg march, supine heel slide, posterior pelvic tilt.    PT Home Exercise Plan  LTR, piriformis stretch, single knee to chest stretch.    Consulted and Agree with Plan of Care Patient      Patient will benefit from skilled therapeutic intervention in order to improve the following deficits and impairments:  Abnormal gait, Decreased activity tolerance, Decreased strength, Decreased mobility, Impaired UE functional use, Increased muscle spasms, Pain, Impaired sensation  Visit Diagnosis: Acute left-sided low back pain with left-sided sciatica  Muscle spasm of back  Muscle weakness (generalized)  Difficulty in walking, not elsewhere classified     Problem List Patient Active Problem List   Diagnosis Date Noted  . Lumbar radiculopathy 03/22/2014  . Greater trochanteric bursitis of left hip 03/22/2014  . Benign neoplasm of colon 07/04/2013  . Diverticulosis of colon (without mention of hemorrhage) 07/04/2013  . External hemorrhoids without mention of complication 87/09/5824  . Hypothyroidism following radioiodine therapy 06/08/2013  . Unspecified constipation 09/19/2012  . Sciatica neuralgia 09/06/2012  . Multinodular goiter 07/13/2012    Carney Living  PT DPT  02/02/2016, 3:39 PM  [facility] [address] [place], [place], [address] Phone: [phone]   Fax:  [phone]  Name: Yassmine Tamm MRN: [id number] Date of Birth: Mar 29, 1966

## 2016-02-04 ENCOUNTER — Ambulatory Visit: Payer: BLUE CROSS/BLUE SHIELD | Admitting: Physical Therapy

## 2016-02-09 ENCOUNTER — Ambulatory Visit: Payer: BLUE CROSS/BLUE SHIELD | Attending: Internal Medicine | Admitting: Physical Therapy

## 2016-02-09 DIAGNOSIS — M5442 Lumbago with sciatica, left side: Secondary | ICD-10-CM | POA: Insufficient documentation

## 2016-02-09 DIAGNOSIS — M62830 Muscle spasm of back: Secondary | ICD-10-CM | POA: Diagnosis present

## 2016-02-09 DIAGNOSIS — R262 Difficulty in walking, not elsewhere classified: Secondary | ICD-10-CM | POA: Insufficient documentation

## 2016-02-09 DIAGNOSIS — M6281 Muscle weakness (generalized): Secondary | ICD-10-CM

## 2016-02-09 NOTE — Therapy (Signed)
[facility] [address] [place], [place], [address] Phone: [phone]   Fax:  [phone]  Physical Therapy Treatment  Patient Details  Name: Kimberly Hanson MRN: [id number] Date of Birth: 11/12/1965 Referring Provider: Dr Mohammad Garba   Encounter Date: 02/09/2016      PT End of Session - 02/09/16 1041    Visit Number 4   Number of Visits 16   Date for PT Re-Evaluation 03/16/16   Authorization Type [employer] 30% co-insurance    PT Start Time 1016   PT Stop Time 1112   PT Time Calculation (min) 56 min   Activity Tolerance Patient tolerated treatment well   Behavior During Therapy [facility] for tasks assessed/performed      Past Medical History:  Diagnosis Date  . Cholelithiasis    On CT 06/26/13  . Diverticulosis    On CT 06/26/13  . Hyperthyroidism   . Multinodular goiter   . Seizures ([facility])     Past Surgical History:  Procedure Laterality Date  . ABDOMINAL HYSTERECTOMY  2002  . CESAREAN SECTION     x 2  . COLONOSCOPY N/A 07/04/2013   Procedure: COLONOSCOPY;  Surgeon: Daniel P Jacobs, MD;  Location: [facility];  Service: Endoscopy;  Laterality: N/A;    There were no vitals filed for this visit.      Subjective Assessment - 02/09/16 1031    Subjective Patient reports that on Saturday She had some numbness and tingling. She was doing her housework on Saturday. Her pain is better today. She has been doing her exercises.    Pertinent History Nothing at this time    Limitations Sitting;Walking;Standing   How long can you sit comfortably? < 40 min    How long can you stand comfortably? < 10 min    How long can you walk comfortably? limited distances before pain begins    Diagnostic tests X-ray: L4-L5 L5 -S1 spondylosis    Patient Stated Goals to have less pain when working    Currently in Pain? Yes   Pain Score 5    Pain Location Back   Pain Orientation Left   Pain Descriptors / Indicators Aching   Pain Type Acute pain    Pain Radiating Towards pain into the left leg    Pain Onset 1 to 4 weeks ago   Pain Frequency Constant   Aggravating Factors  standing, walking, pain at night    Pain Relieving Factors rest, stretches    Effect of Pain on Daily Activities difficulty working    Multiple Pain Sites No                         [facility] Adult PT Treatment/Exercise - 02/09/16 0001      Lumbar Exercises: Stretches   Passive Hamstring Stretch Limitations 2x20 sec bilateral    Single Knee to Chest Stretch Limitations 2x20sec    Lower Trunk Rotation Limitations x10    Piriformis Stretch Limitations 2x20sec      Lumbar Exercises: Supine   AB Set Limitations with cuing for light pelvic tilt x10    Clam Limitations red 2x10    Bent Knee Raise Limitations 2x10    Other Supine Lumbar Exercises supine shoulder flexion with yellow ball 2x10;  shoulder rotation with yellow ball 2x10      Modalities   Modalities Moist Heat     Moist Heat Therapy   Number Minutes Moist Heat 10 Minutes   Moist   Heat Location Lumbar Spine     Manual Therapy   Manual therapy comments Manual long axis distraction with grade 4 mobilization; posterior rotation of left inominant MET with quad muscle; Soft tissue mobilization to the lumbar spine. IASTM to lumbar spine                 PT Education - 02/09/16 1039    Education provided Yes   Education Details educated on the importance of core stregthening    Person(s) Educated Patient   Methods Explanation;Demonstration;Handout   Comprehension Verbalized understanding;Returned demonstration;Need further instruction          PT Short Term Goals - 02/09/16 1246      PT SHORT TERM GOAL #1   Title Patient will demsotrate a good core contraction    Baseline good contraction    Time 2   Period Weeks   Status Achieved     PT SHORT TERM GOAL #2   Title Patient will report 2/10 pain at worst    Baseline 5/10 over the weekend    Time 4   Period Weeks    Status On-going     PT SHORT TERM GOAL #3   Title Patient will increase L LE strength to 4+/5    Time 4   Period Weeks   Status On-going     PT SHORT TERM GOAL #4   Title Patient will be independent with intial program for core stregthening    Time 4   Period Weeks   Status On-going     PT SHORT TERM GOAL #5   Title Patient will report cetralized back pain with no radicular symptoms going down the back   Baseline still having pain into the left foot    Time 4   Period Weeks   Status On-going           PT Long Term Goals - 01/20/16 1749      PT LONG TERM GOAL #1   Title Patient will stand at work for 1 hour without increased back pain    Time 4   Period Weeks   Status New     PT LONG TERM GOAL #2   Title Patient will stand on with equal weight on her left and right legs without increased back pain    Time 4   Period Weeks   Status New     PT LONG TERM GOAL #3   Title Patient will ambulate 3000' without increased pain in order to go shopping    Time 4   Period Weeks   Status New     PT LONG TERM GOAL #4   Title Patient will demonstrate a 43% deficit on FOTO    Time 8   Period Weeks   Status New               Plan - 02/09/16 1244    Clinical Impression Statement Patient tolerated treatment well. She had tendneress to plappation with soft tisasue work. She feels like overall she is improving but at times still has pain.    Rehab Potential Good   PT Frequency 2x / week   PT Duration 8 weeks   PT Treatment/Interventions ADLs/Self Care Home Management;Cryotherapy;Electrical Stimulation;Iontophoresis 4mg/ml Dexamethasone;Moist Heat;Ultrasound;Stair training;Functional mobility training;Patient/family education;Neuromuscular re-education;Therapeutic exercise;Therapeutic activities;Manual techniques;Passive range of motion;Dry needling;Taping   PT Next Visit Plan Continue with manual triggger point release and joint mobilization to improve lumbar extension.  Add light core and hip strengthening. Consider supine   hip flexion, supine clam shell, Standing left leg march, supine heel slide, posterior pelvic tilt.    PT Home Exercise Plan LTR, piriformis stretch, single knee to chest stretch.    Consulted and Agree with Plan of Care Patient      Patient will benefit from skilled therapeutic intervention in order to improve the following deficits and impairments:  Abnormal gait, Decreased activity tolerance, Decreased strength, Decreased mobility, Impaired UE functional use, Increased muscle spasms, Pain, Impaired sensation  Visit Diagnosis: Acute left-sided low back pain with left-sided sciatica  Muscle spasm of back  Muscle weakness (generalized)  Difficulty in walking, not elsewhere classified     Problem List Patient Active Problem List   Diagnosis Date Noted  . Lumbar radiculopathy 03/22/2014  . Greater trochanteric bursitis of left hip 03/22/2014  . Benign neoplasm of colon 07/04/2013  . Diverticulosis of colon (without mention of hemorrhage) 07/04/2013  . External hemorrhoids without mention of complication 09/47/0962  . Hypothyroidism following radioiodine therapy 06/08/2013  . Unspecified constipation 09/19/2012  . Sciatica neuralgia 09/06/2012  . Multinodular goiter 07/13/2012    Carney Living PT DPT  02/09/2016, 12:49 PM  [facility] [address] [place], [place], [address] Phone: [phone]   Fax:  [phone]  Name: Kimberly Hanson MRN: [id number] Date of Birth: March 08, 1966

## 2016-02-11 ENCOUNTER — Ambulatory Visit: Payer: BLUE CROSS/BLUE SHIELD | Admitting: Physical Therapy

## 2016-02-16 ENCOUNTER — Ambulatory Visit: Payer: BLUE CROSS/BLUE SHIELD | Admitting: Physical Therapy

## 2016-02-16 DIAGNOSIS — M5442 Lumbago with sciatica, left side: Secondary | ICD-10-CM

## 2016-02-16 DIAGNOSIS — M6281 Muscle weakness (generalized): Secondary | ICD-10-CM

## 2016-02-16 DIAGNOSIS — R262 Difficulty in walking, not elsewhere classified: Secondary | ICD-10-CM

## 2016-02-16 DIAGNOSIS — M62830 Muscle spasm of back: Secondary | ICD-10-CM

## 2016-02-16 NOTE — Therapy (Signed)
[place], [place], [address] Phone: [phone]   Fax:  [phone]  Physical Therapy Treatment  Patient Details  Name: Kimberly Hanson MRN: [id number] Date of Birth: 05-03-1965 Referring Provider: Dr Gala Romney   Encounter Date: 02/16/2016      PT End of Session - 02/16/16 1014    Visit Number 5   Number of Visits 16   Date for PT Re-Evaluation 03/16/16   Authorization Type BCBS 30% co-insurance    PT Start Time 1012   PT Stop Time 1104   PT Time Calculation (min) 52 min   Activity Tolerance Patient tolerated treatment well   Behavior During Therapy [facility] for tasks assessed/performed      Past Medical History:  Diagnosis Date  . Cholelithiasis    On CT 06/26/13  . Diverticulosis    On CT 06/26/13  . Hyperthyroidism   . Multinodular goiter   . Seizures ([place])     Past Surgical History:  Procedure Laterality Date  . ABDOMINAL HYSTERECTOMY  2002  . CESAREAN SECTION     x 2  . COLONOSCOPY N/A 07/04/2013   Procedure: COLONOSCOPY;  Surgeon: Milus Banister, MD;  Location: [facility];  Service: Endoscopy;  Laterality: N/A;    There were no vitals filed for this visit.      Subjective Assessment - 02/16/16 1034    Subjective Patient reports her pain has been bad over the weekend. She is having increased numbness when walking and increased cramping in her hamstring and calf. She feels like her ankle has been " twitching". She was advised at this point that it may be time to go back to see her MD.    Pertinent History Nothing at this time    Limitations Sitting;Walking;Standing   How long can you sit comfortably? < 40 min    How long can you stand comfortably? < 10 min    How long can you walk comfortably? limited distances before pain begins    Diagnostic tests X-ray: L4-L5 L5 -S1 spondylosis    Patient Stated Goals to have less pain when working    Currently in Pain? Yes   Pain Score 7     Pain Location Back   Pain Orientation Left   Pain Descriptors / Indicators Aching   Pain Type Acute pain   Pain Radiating Towards pain into the left leg,    Pain Onset More than a month ago   Pain Frequency Constant   Aggravating Factors  standing, walking, pain at night    Pain Relieving Factors rest, stretches    Effect of Pain on Daily Activities difficulty walking    Multiple Pain Sites No                                 PT Education - 02/16/16 1113    Education provided Yes   Education Details improtance of strethcing,    Person(s) Educated Patient   Methods Explanation;Demonstration   Comprehension Returned demonstration;Verbal cues required;Verbalized understanding          PT Short Term Goals - 02/16/16 1121      PT SHORT TERM GOAL #1   Title Patient will demsotrate a good core contraction    Baseline good contraction    Time 2   Period Weeks   Status Achieved     PT SHORT TERM GOAL #2   Title  Patient will report 2/10 pain at worst    Baseline 5/10 over the weekend    Time 4   Period Weeks   Status On-going     PT SHORT TERM GOAL #3   Title Patient will increase L LE strength to 4+/5    Time 4   Period Weeks   Status On-going     PT SHORT TERM GOAL #4   Title Patient will be independent with intial program for core stregthening    Time 4   Period Weeks   Status On-going     PT SHORT TERM GOAL #5   Title Patient will report cetralized back pain with no radicular symptoms going down the back   Baseline still having pain into the left foot    Time 4   Period Weeks   Status On-going           PT Long Term Goals - 01/20/16 1749      PT LONG TERM GOAL #1   Title Patient will stand at work for 1 hour without increased back pain    Time 4   Period Weeks   Status New     PT LONG TERM GOAL #2   Title Patient will stand on with equal weight on her left and right legs without increased back pain    Time 4   Period Weeks    Status New     PT LONG TERM GOAL #3   Title Patient will ambulate 3000' without increased pain in order to go shopping    Time 4   Period Weeks   Status New     PT LONG TERM GOAL #4   Title Patient will demonstrate a 43% deficit on FOTO    Time 8   Period Weeks   Status New               Plan - 02/16/16 1116    Clinical Impression Statement Patient continues to have radicular symptoms and she is beggining to have increased muscle spasms. She tolerated exercises well. She had a large spasm around L2-L3 this treatment. She reported improved pain after treatment. She was advised to make an appointment weith her docotr. She may benefit from dry needling if it can be scheduled.    Rehab Potential Good   PT Frequency 2x / week   PT Duration 8 weeks   PT Treatment/Interventions ADLs/Self Care Home Management;Cryotherapy;Electrical Stimulation;Iontophoresis 4mg /ml Dexamethasone;Moist Heat;Ultrasound;Stair training;Functional mobility training;Patient/family education;Neuromuscular re-education;Therapeutic exercise;Therapeutic activities;Manual techniques;Passive range of motion;Dry needling;Taping   PT Next Visit Plan Continue with manual triggger point release and joint mobilization to improve lumbar extension. Add light core and hip strengthening. Consider supine hip flexion, supine clam shell, Standing left leg march, supine heel slide, posterior pelvic tilt.    PT Home Exercise Plan LTR, piriformis stretch, single knee to chest stretch.    Consulted and Agree with Plan of Care Patient      Patient will benefit from skilled therapeutic intervention in order to improve the following deficits and impairments:  Abnormal gait, Decreased activity tolerance, Decreased strength, Decreased mobility, Impaired UE functional use, Increased muscle spasms, Pain, Impaired sensation  Visit Diagnosis: Acute left-sided low back pain with left-sided sciatica  Muscle spasm of back  Muscle  weakness (generalized)  Difficulty in walking, not elsewhere classified     Problem List Patient Active Problem List   Diagnosis Date Noted  . Lumbar radiculopathy 03/22/2014  . Greater trochanteric bursitis of left hip  03/22/2014  . Benign neoplasm of colon 07/04/2013  . Diverticulosis of colon (without mention of hemorrhage) 07/04/2013  . External hemorrhoids without mention of complication 123XX123  . Hypothyroidism following radioiodine therapy 06/08/2013  . Unspecified constipation 09/19/2012  . Sciatica neuralgia 09/06/2012  . Multinodular goiter 07/13/2012    Carney Living  PT DPT  02/16/2016, 1:23 PM  [facility] [address] [place], [place], [address] Phone: [phone]   Fax:  [phone]  Name: Kimberly Hanson MRN: [id number] Date of Birth: 01-27-1966

## 2016-02-18 ENCOUNTER — Ambulatory Visit: Payer: BLUE CROSS/BLUE SHIELD | Admitting: Physical Therapy

## 2016-02-20 ENCOUNTER — Encounter: Payer: Self-pay | Admitting: Endocrinology

## 2016-02-20 ENCOUNTER — Other Ambulatory Visit: Payer: Self-pay

## 2016-02-20 ENCOUNTER — Ambulatory Visit (INDEPENDENT_AMBULATORY_CARE_PROVIDER_SITE_OTHER): Payer: BLUE CROSS/BLUE SHIELD | Admitting: Endocrinology

## 2016-02-20 VITALS — BP 122/78 | HR 81 | Ht 66.0 in | Wt 238.0 lb

## 2016-02-20 DIAGNOSIS — E042 Nontoxic multinodular goiter: Secondary | ICD-10-CM | POA: Diagnosis not present

## 2016-02-20 DIAGNOSIS — E890 Postprocedural hypothyroidism: Secondary | ICD-10-CM

## 2016-02-20 LAB — TSH: TSH: 31.91 u[IU]/mL — ABNORMAL HIGH (ref 0.35–4.50)

## 2016-02-20 MED ORDER — LEVOTHYROXINE SODIUM 175 MCG PO TABS: 175.0000 ug | ORAL_TABLET | Freq: Every day | ORAL | 11 refills | Status: DC

## 2016-02-20 MED ORDER — LEVOTHYROXINE SODIUM 175 MCG PO TABS
175.0000 ug | ORAL_TABLET | Freq: Every day | ORAL | 11 refills | Status: DC
Start: 2016-02-20 — End: 2017-03-16

## 2016-02-20 NOTE — Progress Notes (Signed)
   Subjective:    Patient ID: Kimberly Hanson, female    DOB: 02-22-1966, 50 y.o.   MRN: [id number]  HPI Pt returns for f/u of post-RAI hypothyroidism (in 2012, pt was noted to have hyperthyroidism, and a nodular goiter; after a bx (benign), she had RAI rx later in 2012; and a second dose of RAI in 2014; she then developed hypothyroidism, so she was started on synthroid; repeat US in early 2015 showed smaller goiter, except the the dominant nodule was unchanged).  pt says her thyroid is slightly swollen.  Pt says she never misses the synthroid.  Past Medical History:  Diagnosis Date  . Cholelithiasis    On CT 06/26/13  . Diverticulosis    On CT 06/26/13  . Hyperthyroidism   . Multinodular goiter   . Seizures ([place])     Past Surgical History:  Procedure Laterality Date  . ABDOMINAL HYSTERECTOMY  2002  . CESAREAN SECTION     x 2  . COLONOSCOPY N/A 07/04/2013   Procedure: COLONOSCOPY;  Surgeon: Milus Banister, MD;  [facility];  Service: Endoscopy;  Laterality: N/A;    Social History   Social History  . Marital status: Single    Spouse name: N/A  . Number of children: 2  . Years of education: 12+   Occupational History  . [employer] History Main Topics  . Smoking status: Current Every Day Smoker    Packs/day: 0.25    Years: 5.00    Types: Cigarettes  . Smokeless tobacco: Never Used     Comment: tobacco info given 06/29/13  . Alcohol use Yes     Comment: occasional  . Drug use: No  . Sexual activity: Yes   Other Topics Concern  . Not on file   Social History Narrative   Regular exercise-no   Caffeine Use-yes    Current Outpatient Prescriptions on File Prior to Visit  Medication Sig Dispense Refill  . acetaminophen (TYLENOL) 500 MG tablet Take 1,000 mg by mouth every 6 (six) hours as needed for mild pain.    Marland Kitchen albuterol (PROVENTIL HFA;VENTOLIN HFA) 108 (90 BASE) MCG/ACT inhaler Inhale 1-2 puffs into the lungs every 6 (six) hours as needed for  wheezing or shortness of breath. 1 Inhaler 0  . meloxicam (MOBIC) 15 MG tablet Take 15 mg by mouth daily.     No current facility-administered medications on file prior to visit.     Allergies  Allergen Reactions  . Naproxen Anaphylaxis    Family History  Problem Relation Age of Onset  . Hypertension Mother   . Colon polyps Mother   . Colon cancer Neg Hx     BP 122/78   Pulse 81   Ht 5\' 6"  (1.676 m)   Wt 238 lb (108 kg)   SpO2 99%   BMI 38.41 kg/m   Review of Systems Denies neck pain.    Objective:   Physical Exam VITAL SIGNS:  See vs page. GENERAL: no distress.  NECK: thyroid is approx twice normal size, with a multinodular surface, but I cannot palpate any nodule.   Lab Results  Component Value Date   TSH 31.91 (H) 02/20/2016      Assessment & Plan:  Hypothyroidism: he needs increased rx.  I have sent a prescription to your pharmacy, to increase.

## 2016-02-20 NOTE — Patient Instructions (Addendum)
blood tests are requested for you today.  We'll let you know about the results. Let's recheck the ultrasound.  you will receive a phone call, about a day and time for an appointment. Please return in 1 year. 

## 2016-02-23 ENCOUNTER — Ambulatory Visit: Payer: BLUE CROSS/BLUE SHIELD | Admitting: Physical Therapy

## 2016-02-23 ENCOUNTER — Encounter: Payer: Self-pay | Admitting: Physical Therapy

## 2016-02-23 DIAGNOSIS — M5442 Lumbago with sciatica, left side: Secondary | ICD-10-CM | POA: Diagnosis not present

## 2016-02-23 DIAGNOSIS — M62830 Muscle spasm of back: Secondary | ICD-10-CM

## 2016-02-23 DIAGNOSIS — R262 Difficulty in walking, not elsewhere classified: Secondary | ICD-10-CM

## 2016-02-23 DIAGNOSIS — M6281 Muscle weakness (generalized): Secondary | ICD-10-CM

## 2016-02-23 NOTE — Therapy (Signed)
[place], [place], [address] Phone: [phone]   Fax:  [phone]  Physical Therapy Treatment  Patient Details  Name: Kimberly Hanson MRN: [id number] Date of Birth: 24-Sep-1965 Referring Provider: Dr Gala Romney   Encounter Date: 02/23/2016      PT End of Session - 02/23/16 1343    Visit Number 6   Number of Visits 16   Date for PT Re-Evaluation 03/16/16   Authorization Type BCBS 30% co-insurance    PT Start Time 1339   PT Stop Time 7564   PT Time Calculation (min) 38 min   Activity Tolerance Patient tolerated treatment well   Behavior During Therapy [facility] for tasks assessed/performed      Past Medical History:  Diagnosis Date  . Cholelithiasis    On CT 06/26/13  . Diverticulosis    On CT 06/26/13  . Hyperthyroidism   . Multinodular goiter   . Seizures ([place])     Past Surgical History:  Procedure Laterality Date  . ABDOMINAL HYSTERECTOMY  2002  . CESAREAN SECTION     x 2  . COLONOSCOPY N/A 07/04/2013   Procedure: COLONOSCOPY;  Surgeon: Milus Banister, MD;  [facility];  Service: Endoscopy;  Laterality: N/A;    There were no vitals filed for this visit.                       [facility] Adult PT Treatment/Exercise - 02/23/16 0001      Lumbar Exercises: Stretches   Passive Hamstring Stretch Limitations 2x20 sec bilateral    Single Knee to Chest Stretch Limitations 2x20sec    Lower Trunk Rotation Limitations x10    Piriformis Stretch Limitations 2x20sec      Lumbar Exercises: Supine   AB Set Limitations with cuing for light pelvic tilt x10    Clam Limitations green  2x10    Bent Knee Raise Limitations 2x10    Other Supine Lumbar Exercises supine shoulder flexion with yellow ball 2x10;  shoulder rotation with yellow ball 2x10      Modalities   Modalities Moist Heat     Moist Heat Therapy   Moist Heat Location Lumbar Spine     Manual Therapy   Manual therapy  comments Manual long axis distraction with grade 4 mobilization; posterior rotation of left inominant MET with quad muscle; Soft tissue mobilization to the lumbar spine. IASTM to lumbar spine                   PT Short Term Goals - 02/16/16 1121      PT SHORT TERM GOAL #1   Title Patient will demsotrate a good core contraction    Baseline good contraction    Time 2   Period Weeks   Status Achieved     PT SHORT TERM GOAL #2   Title Patient will report 2/10 pain at worst    Baseline 5/10 over the weekend    Time 4   Period Weeks   Status On-going     PT SHORT TERM GOAL #3   Title Patient will increase L LE strength to 4+/5    Time 4   Period Weeks   Status On-going     PT SHORT TERM GOAL #4   Title Patient will be independent with intial program for core stregthening    Time 4   Period Weeks   Status On-going     PT SHORT TERM  GOAL #5   Title Patient will report cetralized back pain with no radicular symptoms going down the back   Baseline still having pain into the left foot    Time 4   Period Weeks   Status On-going           PT Long Term Goals - 01/20/16 1749      PT LONG TERM GOAL #1   Title Patient will stand at work for 1 hour without increased back pain    Time 4   Period Weeks   Status New     PT LONG TERM GOAL #2   Title Patient will stand on with equal weight on her left and right legs without increased back pain    Time 4   Period Weeks   Status New     PT LONG TERM GOAL #3   Title Patient will ambulate 3000' without increased pain in order to go shopping    Time 4   Period Weeks   Status New     PT LONG TERM GOAL #4   Title Patient will demonstrate a 43% deficit on FOTO    Time 8   Period Weeks   Status New               Plan - 02/23/16 1350    Clinical Impression Statement The patient will see her MD on 03/01/2016. She feesl like sh is making some progress over the weekend. She hads had no radiuclar symptoms. Her  spasming has decreased. She still is having pain with work and IADL's/. She will continue with therapy per MD if she continues to see releif. She has had furter radiuclar symptoms.    Rehab Potential Good   PT Frequency 2x / week   PT Duration 8 weeks   PT Treatment/Interventions ADLs/Self Care Home Management;Cryotherapy;Electrical Stimulation;Iontophoresis 22m/ml Dexamethasone;Moist Heat;Ultrasound;Stair training;Functional mobility training;Patient/family education;Neuromuscular re-education;Therapeutic exercise;Therapeutic activities;Manual techniques;Passive range of motion;Dry needling;Taping   PT Next Visit Plan Continue with manual triggger point release and joint mobilization to improve lumbar extension. Add light core and hip strengthening. Consider supine hip flexion, supine clam shell, Standing left leg march, supine heel slide, posterior pelvic tilt.    PT Home Exercise Plan LTR, piriformis stretch, single knee to chest stretch.    Consulted and Agree with Plan of Care Patient      Patient will benefit from skilled therapeutic intervention in order to improve the following deficits and impairments:  Abnormal gait, Decreased activity tolerance, Decreased strength, Decreased mobility, Impaired UE functional use, Increased muscle spasms, Pain, Impaired sensation  Visit Diagnosis: Acute left-sided low back pain with left-sided sciatica  Muscle spasm of back  Muscle weakness (generalized)  Difficulty in walking, not elsewhere classified     Problem List Patient Active Problem List   Diagnosis Date Noted  . Lumbar radiculopathy 03/22/2014  . Greater trochanteric bursitis of left hip 03/22/2014  . Benign neoplasm of colon 07/04/2013  . Diverticulosis of colon (without mention of hemorrhage) 07/04/2013  . External hemorrhoids without mention of complication 009/23/3007 . Hypothyroidism following radioiodine therapy 06/08/2013  . Unspecified constipation 09/19/2012  . Sciatica  neuralgia 09/06/2012  . Multinodular goiter 07/13/2012    DCarney LivingPT DPT  02/23/2016, 3:42 PM  [facility] [address]Phone: [phone]  Fax:  [phone] Name: Kimberly UedaMRN: [id number] of Birth: 822-Sep-1967

## 2016-03-10 ENCOUNTER — Ambulatory Visit
Admission: RE | Admit: 2016-03-10 | Discharge: 2016-03-10 | Disposition: A | Discharge status: Home / Self Care | Payer: BLUE CROSS/BLUE SHIELD | Source: Ambulatory Visit | Attending: Endocrinology | Admitting: Endocrinology

## 2016-03-10 DIAGNOSIS — E042 Nontoxic multinodular goiter: Secondary | ICD-10-CM

## 2016-05-05 ENCOUNTER — Ambulatory Visit: Payer: BLUE CROSS/BLUE SHIELD | Attending: Internal Medicine | Admitting: Physical Therapy

## 2016-05-05 ENCOUNTER — Encounter: Payer: Self-pay | Admitting: Physical Therapy

## 2016-05-05 DIAGNOSIS — M6281 Muscle weakness (generalized): Secondary | ICD-10-CM | POA: Diagnosis present

## 2016-05-05 DIAGNOSIS — R262 Difficulty in walking, not elsewhere classified: Secondary | ICD-10-CM | POA: Diagnosis present

## 2016-05-05 DIAGNOSIS — M5442 Lumbago with sciatica, left side: Secondary | ICD-10-CM | POA: Diagnosis not present

## 2016-05-05 DIAGNOSIS — M62830 Muscle spasm of back: Secondary | ICD-10-CM | POA: Diagnosis present

## 2016-05-05 NOTE — Therapy (Signed)
[place], [place], [address] Phone: [phone]   Fax:  [phone]  Physical Therapy Treatment/ Re-evaluation   Patient Details  Name: Kimberly Hanson MRN: [id number] Date of Birth: [birth year] Referring Provider: Dr Gala Romney   Encounter Date: 05/05/2016      PT End of Session - 05/05/16 0902    Visit Number 7   Number of Visits 16   Date for PT Re-Evaluation 03/16/16   Authorization Type BCBS 30% co-insurance    PT Start Time 0855   PT Stop Time 0934   PT Time Calculation (min) 39 min   Activity Tolerance Patient tolerated treatment well   Behavior During Therapy [facility] for tasks assessed/performed      Past Medical History:  Diagnosis Date  . Cholelithiasis    On CT 06/26/13  . Diverticulosis    On CT 06/26/13  . Hyperthyroidism   . Multinodular goiter   . Seizures ([place])     Past Surgical History:  Procedure Laterality Date  . ABDOMINAL HYSTERECTOMY  2002  . CESAREAN SECTION     x 2  . COLONOSCOPY N/A 07/04/2013   Procedure: COLONOSCOPY;  Surgeon: Milus Banister, MD;  [facility];  Service: Endoscopy;  Laterality: N/A;    There were no vitals filed for this visit.      Subjective Assessment - 05/05/16 0858    Subjective Patient reports no change in her pain. She was last seen in [place]. She feels like she has been having difficulty standing up straight. She is having numbness and pain in her left leg. The pain is worse with activity. Sometimes sitting in one spot increase her pain too. She feels like her pain comes back.    Pertinent History Nothing at this time    Limitations Sitting;Walking;Standing   How long can you sit comfortably? < 40 min    How long can you stand comfortably? < 10 min    How long can you walk comfortably? limited distances before pain begins    Diagnostic tests X-ray: L4-L5 L5 -S1 spondylosis    Currently in Pain? Yes   Pain Score 6   Pain can  be 10/10    Pain Location Back   Pain Orientation Left   Pain Descriptors / Indicators Aching   Pain Type Chronic pain   Pain Radiating Towards pain inot the left leg    Pain Onset More than a month ago   Pain Frequency Constant   Aggravating Factors  standing, walking, pain at night    Pain Relieving Factors rest, stretches, bio-freeze    Effect of Pain on Daily Activities difficulty walking    Multiple Pain Sites No            [facility] PT Assessment - 05/05/16 0001      Assessment   Medical Diagnosis L lower back pain L sciatica    Referring Provider Dr Gala Romney    Onset Date/Surgical Date 05/05/16   Hand Dominance Right   Next MD Visit 3 weeks      Precautions   Precautions None     Restrictions   Weight Bearing Restrictions No     Cognition   Overall Cognitive Status Within Functional Limits for tasks assessed   Attention Focused   Focused Attention Appears intact   Memory Appears intact   Awareness Appears intact   Problem Solving Appears intact     Observation/Other Assessments   Focus on Therapeutic  Outcomes (FOTO)  53% limitation      Sensation   Additional Comments Numbness into the Left lateral thigh and now into the top of the foot      Coordination   Gross Motor Movements are Fluid and Coordinated Yes     Posture/Postural Control   Posture Comments Stands with a flexed posture      AROM   Lumbar Flexion No limit imporved pain    Lumbar Extension limited 75% with increased pain    Lumbar - Right Side Bend WNL no pain   Lumbar - Left Side Bend limited 50 % with increased pain    Lumbar - Right Rotation WNL no pain      PROM   Overall PROM Comments Pain with left hip external rotation      Strength   Right Hip Flexion 5/5   Right Hip Extension 5/5   Left Hip Flexion 3/5   Left Hip Extension 3/5   Left Hip ABduction 3+/5   Left Hip ADduction 3+/5   Right Knee Flexion 5/5   Right Knee Extension 5/5   Left Knee Extension 3+/5      Ambulation/Gait   Gait Comments trendelenburgh on the left side. Decreased single lef stance time on the left                     [facility] Adult PT Treatment/Exercise - 05/05/16 0001      Lumbar Exercises: Stretches   Passive Hamstring Stretch Limitations 2x20 sec bilateral    Single Knee to Chest Stretch Limitations 2x20sec    Lower Trunk Rotation Limitations x10    Piriformis Stretch Limitations 2x20sec      Lumbar Exercises: Standing   Other Standing Lumbar Exercises standing right side bending 2x10 reduced numbness into her left side.      Manual Therapy   Manual therapy comments Manual long axis distraction with grade 4 mobilization; Soft tissue mobilization to the lumbar spine.                 PT Education - 05/05/16 0902    Education provided Yes   Education Details revieed HEP    Person(s) Educated Patient   Methods Explanation;Demonstration   Comprehension Verbalized understanding;Returned demonstration;Verbal cues required          PT Short Term Goals - 05/05/16 1011      PT SHORT TERM GOAL #1   Title Patient will demsotrate a good core contraction    Baseline good contraction    Time 4   Period Weeks   Status New     PT SHORT TERM GOAL #2   Title Patient will report 2/10 pain at worst    Baseline 6/10 today up to a 10/10    Time 4   Period Weeks   Status On-going     PT SHORT TERM GOAL #3   Title Patient will increase L LE strength to 4+/5    Time 4   Period Weeks   Status On-going           PT Long Term Goals - 01/20/16 1749      PT LONG TERM GOAL #1   Title Patient will stand at work for 1 hour without increased back pain    Time 4   Period Weeks   Status New     PT LONG TERM GOAL #2   Title Patient will stand on with equal weight on her left and right legs  without increased back pain    Time 4   Period Weeks   Status New     PT LONG TERM GOAL #3   Title Patient will ambulate 3000' without increased pain in order  to go shopping    Time 4   Period Weeks   Status New     PT LONG TERM GOAL #4   Title Patient will demonstrate a 43% deficit on FOTO    Time 8   Period Weeks   Status New               Plan - 05/05/16 1005    Clinical Impression Statement Patient returns after not being back since Novemebr of 2017. She continues to have significant left sided lower back pain that radiates into her left leg. She has spasming on the left side. She reports the stretches help. She has increased weakness compared to the left side. She had relief with right side bending and forward flexion.  She also had improved pain with manual therapy. She would benefit from further therapy to improve left leg and core strength and to decrease radicualr symptoms.    Rehab Potential Good   PT Frequency 2x / week   PT Duration 8 weeks   PT Treatment/Interventions ADLs/Self Care Home Management;Cryotherapy;Electrical Stimulation;Iontophoresis 4mg /ml Dexamethasone;Moist Heat;Ultrasound;Stair training;Functional mobility training;Patient/family education;Neuromuscular re-education;Therapeutic exercise;Therapeutic activities;Manual techniques;Passive range of motion;Dry needling;Taping   PT Home Exercise Plan LTR, piriformis stretch, single knee to chest stretch. Right side bending    Consulted and Agree with Plan of Care Patient      Patient will benefit from skilled therapeutic intervention in order to improve the following deficits and impairments:  Abnormal gait, Decreased activity tolerance, Decreased strength, Decreased mobility, Impaired UE functional use, Increased muscle spasms, Pain, Impaired sensation  Visit Diagnosis: Acute left-sided low back pain with left-sided sciatica  Muscle spasm of back  Muscle weakness (generalized)  Difficulty in walking, not elsewhere classified     Problem List Patient Active Problem List   Diagnosis Date Noted  . Lumbar radiculopathy 03/22/2014  . Greater trochanteric  bursitis of left hip 03/22/2014  . Benign neoplasm of colon 07/04/2013  . Diverticulosis of colon (without mention of hemorrhage) 07/04/2013  . External hemorrhoids without mention of complication 123XX123  . Hypothyroidism following radioiodine therapy 06/08/2013  . Unspecified constipation 09/19/2012  . Sciatica neuralgia 09/06/2012  . Multinodular goiter 07/13/2012    Carney Living 05/05/2016, 4:09 PM  [facility] [address] [place], [place], [address] Phone: [phone]   Fax:  [phone]  Name: Kimberly Hanson MRN: [id number] Date of Birth: [birth year]

## 2016-05-05 NOTE — Therapy (Deleted)
[place], [place], [address] Phone: [phone]   Fax:  [phone]  Physical Therapy Treatment  Patient Details  Name: Kimberly Hanson MRN: [id number] Date of Birth: 04/10/1965 Referring Provider: Dr Gala Romney   Encounter Date: 05/05/2016      PT End of Session - 05/05/16 0902    Visit Number 7   Number of Visits 16   Date for PT Re-Evaluation 03/16/16   Authorization Type [employer] 30% co-insurance    PT Start Time 0855   PT Stop Time 0934   PT Time Calculation (min) 39 min   Activity Tolerance Patient tolerated treatment well   Behavior During Therapy [facility] for tasks assessed/performed      Past Medical History:  Diagnosis Date  . Cholelithiasis    On CT 06/26/13  . Diverticulosis    On CT 06/26/13  . Hyperthyroidism   . Multinodular goiter   . Seizures ([place])     Past Surgical History:  Procedure Laterality Date  . ABDOMINAL HYSTERECTOMY  2002  . CESAREAN SECTION     x 2  . COLONOSCOPY N/A 07/04/2013   Procedure: COLONOSCOPY;  Surgeon: Milus Banister, MD;  Location: [facility];  Service: Endoscopy;  Laterality: N/A;    There were no vitals filed for this visit.      Subjective Assessment - 05/05/16 0858    Subjective Patient reports no change in her pain. She was last seen in [place]. She feels like she has been having difficulty standing up straight. She is having numbness and pain in her left leg. The pain is worse with activity. Sometimes sitting in one spot increase her pain too. She feels like her pain comes back.    Pertinent History Nothing at this time    Limitations Sitting;Walking;Standing   How long can you sit comfortably? < 40 min    How long can you stand comfortably? < 10 min    How long can you walk comfortably? limited distances before pain begins    Diagnostic tests X-ray: L4-L5 L5 -S1 spondylosis    Currently in Pain? Yes   Pain Score 6   Pain can be 10/10     Pain Location Back   Pain Orientation Left   Pain Descriptors / Indicators Aching   Pain Type Chronic pain   Pain Radiating Towards pain inot the left leg    Pain Onset More than a month ago   Pain Frequency Constant   Aggravating Factors  standing, walking, pain at night    Pain Relieving Factors rest, stretches, bio-freeze    Effect of Pain on Daily Activities difficulty walking    Multiple Pain Sites No            [facility] PT Assessment - 05/05/16 0001      Assessment   Medical Diagnosis L lower back pain L sciatica    Referring Provider Dr Gala Romney    Onset Date/Surgical Date 05/05/16   Hand Dominance Right   Next MD Visit 3 weeks      Precautions   Precautions None     Restrictions   Weight Bearing Restrictions No     Cognition   Overall Cognitive Status Within Functional Limits for tasks assessed   Attention Focused   Focused Attention Appears intact   Memory Appears intact   Awareness Appears intact   Problem Solving Appears intact     Observation/Other Assessments   Focus on Therapeutic Outcomes (FOTO)  53% limitation      Sensation   Additional Comments Numbness into the Left lateral thigh and now into the top of the foot      Coordination   Gross Motor Movements are Fluid and Coordinated Yes     Posture/Postural Control   Posture Comments Stands with a flexed posture      AROM   Lumbar Flexion No limit imporved pain    Lumbar Extension limited 75% with increased pain    Lumbar - Right Side Bend WNL no pain   Lumbar - Left Side Bend limited 50 % with increased pain    Lumbar - Right Rotation WNL no pain      PROM   Overall PROM Comments Pain with left hip external rotation      Strength   Left Hip Flexion 3/5   Left Hip Extension 3/5   Left Hip ABduction 3+/5   Left Hip ADduction 3+/5   Right Knee Flexion 5/5   Right Knee Extension 5/5   Left Knee Extension 3+/5                             PT Education -  05/05/16 0902    Education provided Yes   Education Details revieed HEP    Person(s) Educated Patient   Methods Explanation;Demonstration   Comprehension Verbalized understanding;Returned demonstration;Verbal cues required          PT Short Term Goals - 05/05/16 1011      PT SHORT TERM GOAL #1   Title Patient will demsotrate a good core contraction    Baseline good contraction    Time 4   Period Weeks   Status New     PT SHORT TERM GOAL #2   Title Patient will report 2/10 pain at worst    Baseline 6/10 today up to a 10/10    Time 4   Period Weeks   Status On-going     PT SHORT TERM GOAL #3   Title Patient will increase L LE strength to 4+/5    Time 4   Period Weeks   Status On-going           PT Long Term Goals - 01/20/16 1749      PT LONG TERM GOAL #1   Title Patient will stand at work for 1 hour without increased back pain    Time 4   Period Weeks   Status New     PT LONG TERM GOAL #2   Title Patient will stand on with equal weight on her left and right legs without increased back pain    Time 4   Period Weeks   Status New     PT LONG TERM GOAL #3   Title Patient will ambulate 3000' without increased pain in order to go shopping    Time 4   Period Weeks   Status New     PT LONG TERM GOAL #4   Title Patient will demonstrate a 43% deficit on FOTO    Time 8   Period Weeks   Status New               Plan - 05/05/16 1005    Clinical Impression Statement Patient returns after not being back since Novemebr of 2017. She continues to have significant left sided lower back pain that radiates into her left leg. She has spasming on the left side. She reports  the stretches help. She has increased weakness compared to the left side. She had relief with right side bending and forward flexion.  She also had improved pain with manual therapy. She would benefit from further therapy to improve left leg and core strength and to decrease radicualr symptoms.     Rehab Potential Good   PT Frequency 2x / week   PT Duration 8 weeks   PT Treatment/Interventions ADLs/Self Care Home Management;Cryotherapy;Electrical Stimulation;Iontophoresis 4mg /ml Dexamethasone;Moist Heat;Ultrasound;Stair training;Functional mobility training;Patient/family education;Neuromuscular re-education;Therapeutic exercise;Therapeutic activities;Manual techniques;Passive range of motion;Dry needling;Taping   PT Home Exercise Plan LTR, piriformis stretch, single knee to chest stretch. Right side bending    Consulted and Agree with Plan of Care Patient      Patient will benefit from skilled therapeutic intervention in order to improve the following deficits and impairments:  Abnormal gait, Decreased activity tolerance, Decreased strength, Decreased mobility, Impaired UE functional use, Increased muscle spasms, Pain, Impaired sensation  Visit Diagnosis: Acute left-sided low back pain with left-sided sciatica  Muscle spasm of back  Muscle weakness (generalized)  Difficulty in walking, not elsewhere classified     Problem List Patient Active Problem List   Diagnosis Date Noted  . Lumbar radiculopathy 03/22/2014  . Greater trochanteric bursitis of left hip 03/22/2014  . Benign neoplasm of colon 07/04/2013  . Diverticulosis of colon (without mention of hemorrhage) 07/04/2013  . External hemorrhoids without mention of complication 123XX123  . Hypothyroidism following radioiodine therapy 06/08/2013  . Unspecified constipation 09/19/2012  . Sciatica neuralgia 09/06/2012  . Multinodular goiter 07/13/2012    Carney Living 05/05/2016, 1:40 PM  [place], [place], [address] Phone: [phone]   Fax:  [phone]  Name: Kimberly Hanson MRN: [id number] Date of Birth: [birth year]

## 2016-05-05 NOTE — Addendum Note (Signed)
Addended by: Carney Living on: 05/05/2016 04:21 PM   Modules accepted: Orders

## 2016-05-10 ENCOUNTER — Encounter: Payer: Self-pay | Admitting: Gastroenterology

## 2016-05-12 ENCOUNTER — Encounter: Payer: Self-pay | Admitting: Physical Therapy

## 2016-05-12 ENCOUNTER — Ambulatory Visit: Payer: BLUE CROSS/BLUE SHIELD | Attending: Internal Medicine | Admitting: Physical Therapy

## 2016-05-12 DIAGNOSIS — R262 Difficulty in walking, not elsewhere classified: Secondary | ICD-10-CM | POA: Diagnosis present

## 2016-05-12 DIAGNOSIS — M62830 Muscle spasm of back: Secondary | ICD-10-CM | POA: Insufficient documentation

## 2016-05-12 DIAGNOSIS — M5442 Lumbago with sciatica, left side: Secondary | ICD-10-CM | POA: Diagnosis present

## 2016-05-12 DIAGNOSIS — M6281 Muscle weakness (generalized): Secondary | ICD-10-CM | POA: Diagnosis present

## 2016-05-12 NOTE — Therapy (Signed)
[place], [place], [address] Phone: [phone]   Fax:  [phone]  Physical Therapy Treatment  Patient Details  Name: Kimberly Hanson MRN: [id number] Date of Birth: [birth year] Referring Provider: Dr Gala Romney   Encounter Date: 05/12/2016      PT End of Session - 05/12/16 1356    Visit Number 8   Number of Visits 22   Date for PT Re-Evaluation 06/30/16   Authorization Type [employer] 30% co-insurance    PT Start Time 1330   PT Stop Time 1421   PT Time Calculation (min) 51 min   Activity Tolerance Patient tolerated treatment well   Behavior During Therapy [facility] for tasks assessed/performed      Past Medical History:  Diagnosis Date  . Cholelithiasis    On CT 06/26/13  . Diverticulosis    On CT 06/26/13  . Hyperthyroidism   . Multinodular goiter   . Seizures ([place])     Past Surgical History:  Procedure Laterality Date  . ABDOMINAL HYSTERECTOMY  2002  . CESAREAN SECTION     x 2  . COLONOSCOPY N/A 07/04/2013   Procedure: COLONOSCOPY;  Surgeon: Milus Banister, MD;  [facility];  Service: Endoscopy;  Laterality: N/A;    There were no vitals filed for this visit.      Subjective Assessment - 05/12/16 1336    Subjective Patient reports that her pain is better then last visit but she is still having significant pain. She is having pain and numbness into her left foot. She is having difficulty walking and sitting for periods of time.     Limitations Sitting;Walking;Standing   How long can you sit comfortably? < 40 min    How long can you stand comfortably? < 10 min    How long can you walk comfortably? limited distances before pain begins    Diagnostic tests X-ray: L4-L5 L5 -S1 spondylosis    Patient Stated Goals to have less pain when working                          [facility] Adult PT Treatment/Exercise - 05/12/16 0001      Lumbar Exercises: Stretches   Passive Hamstring  Stretch Limitations 2x20 sec bilateral    Single Knee to Chest Stretch Limitations 2x20sec    Lower Trunk Rotation Limitations x10    Piriformis Stretch Limitations 2x20sec      Lumbar Exercises: Standing   Other Standing Lumbar Exercises standing right side bending 2x10 reduced numbness into her left side.      Lumbar Exercises: Supine   AB Set Limitations with cuing for light pelvic tilt x10    Clam Limitations green  2x10    Bent Knee Raise Limitations 2x10      Modalities   Modalities Moist Heat     Moist Heat Therapy   Number Minutes Moist Heat 10 Minutes   Moist Heat Location Lumbar Spine     Manual Therapy   Manual therapy comments Manual long axis distraction with grade 4 mobilization; Soft tissue mobilization to the lumbar spine.                 PT Education - 05/12/16 1353    Education provided Yes   Education Details reviewed HEP    Person(s) Educated Patient   Methods Explanation;Demonstration   Comprehension Verbalized understanding;Returned demonstration;Verbal cues required  PT Short Term Goals - 05/05/16 1011      PT SHORT TERM GOAL #1   Title Patient will demsotrate a good core contraction    Baseline good contraction    Time 4   Period Weeks   Status New     PT SHORT TERM GOAL #2   Title Patient will report 2/10 pain at worst    Baseline 6/10 today up to a 10/10    Time 4   Period Weeks   Status On-going     PT SHORT TERM GOAL #3   Title Patient will increase L LE strength to 4+/5    Time 4   Period Weeks   Status On-going           PT Long Term Goals - 01/20/16 1749      PT LONG TERM GOAL #1   Title Patient will stand at work for 1 hour without increased back pain    Time 4   Period Weeks   Status New     PT LONG TERM GOAL #2   Title Patient will stand on with equal weight on her left and right legs without increased back pain    Time 4   Period Weeks   Status New     PT LONG TERM GOAL #3   Title Patient  will ambulate 3000' without increased pain in order to go shopping    Time 4   Period Weeks   Status New     PT LONG TERM GOAL #4   Title Patient will demonstrate a 43% deficit on FOTO    Time 8   Period Weeks   Status New               Plan - 05/12/16 1402    Clinical Impression Statement Patient tolerated treatment well. She had no significant increase in pain with activity and exercises. She continues to have numbness into her left foot. She was given exercises for her hip today wih no significant increase in pain.     Rehab Potential Good   PT Frequency 2x / week   PT Duration 8 weeks   PT Treatment/Interventions ADLs/Self Care Home Management;Cryotherapy;Electrical Stimulation;Iontophoresis 4mg /ml Dexamethasone;Moist Heat;Ultrasound;Stair training;Functional mobility training;Patient/family education;Neuromuscular re-education;Therapeutic exercise;Therapeutic activities;Manual techniques;Passive range of motion;Dry needling;Taping   PT Next Visit Plan Continue with manual triggger point release and joint mobilization to improve lumbar extension. Add light core and hip strengthening. Consider supine hip flexion, supine clam shell, Standing left leg march, supine heel slide, posterior pelvic tilt.    PT Home Exercise Plan LTR, piriformis stretch, single knee to chest stretch. Right side bending    Consulted and Agree with Plan of Care Patient      Patient will benefit from skilled therapeutic intervention in order to improve the following deficits and impairments:  Abnormal gait, Decreased activity tolerance, Decreased strength, Decreased mobility, Impaired UE functional use, Increased muscle spasms, Pain, Impaired sensation  Visit Diagnosis: Acute left-sided low back pain with left-sided sciatica  Muscle spasm of back  Muscle weakness (generalized)  Difficulty in walking, not elsewhere classified     Problem List Patient Active Problem List   Diagnosis Date Noted   . Lumbar radiculopathy 03/22/2014  . Greater trochanteric bursitis of left hip 03/22/2014  . Benign neoplasm of colon 07/04/2013  . Diverticulosis of colon (without mention of hemorrhage) 07/04/2013  . External hemorrhoids without mention of complication 123XX123  . Hypothyroidism following radioiodine therapy 06/08/2013  . Unspecified  constipation 09/19/2012  . Sciatica neuralgia 09/06/2012  . Multinodular goiter 07/13/2012    Carney Living  PT DPT  05/12/2016, 3:42 PM  [facility] [address] [place], [place], [address] Phone: [phone]   Fax:  [phone]  Name: Kimberly Hanson MRN: [id number] Date of Birth: 16-May-1965

## 2016-05-18 ENCOUNTER — Ambulatory Visit: Payer: BLUE CROSS/BLUE SHIELD | Admitting: Physical Therapy

## 2016-05-18 ENCOUNTER — Telehealth: Payer: Self-pay | Admitting: Physical Therapy

## 2016-05-18 NOTE — Telephone Encounter (Signed)
Spoke with pt regarding missing today's visit, which she reported thinking it was tomorrow. Per policy following 2 missed visits that we have to cancel her future appointments except for her next visit. She reported understanding and reminder of her next visit appointment date and time.

## 2016-05-20 ENCOUNTER — Ambulatory Visit: Payer: BLUE CROSS/BLUE SHIELD | Admitting: Physical Therapy

## 2016-05-20 ENCOUNTER — Encounter: Payer: Self-pay | Admitting: Physical Therapy

## 2016-05-20 DIAGNOSIS — M6281 Muscle weakness (generalized): Secondary | ICD-10-CM

## 2016-05-20 DIAGNOSIS — M5442 Lumbago with sciatica, left side: Secondary | ICD-10-CM | POA: Diagnosis not present

## 2016-05-20 DIAGNOSIS — R262 Difficulty in walking, not elsewhere classified: Secondary | ICD-10-CM

## 2016-05-20 DIAGNOSIS — M62830 Muscle spasm of back: Secondary | ICD-10-CM

## 2016-05-20 NOTE — Therapy (Addendum)
[place], [place], [address] Phone: [phone]   Fax:  [phone]  Physical Therapy Treatment/Discharge Summary  Patient Details  Name: Kimberly Hanson MRN: [id number] Date of Birth: [birth year] Referring Provider: Dr Gala Romney   Encounter Date: 05/20/2016      PT End of Session - 05/20/16 1015    Visit Number 9   Number of Visits 22   Date for PT Re-Evaluation 06/30/16   Authorization Type [employer] 30% co-insurance    PT Start Time 1015   PT Stop Time 1055   PT Time Calculation (min) 40 min   Activity Tolerance Patient tolerated treatment well   Behavior During Therapy [facility] for tasks assessed/performed      Past Medical History:  Diagnosis Date  . Cholelithiasis    On CT 06/26/13  . Diverticulosis    On CT 06/26/13  . Hyperthyroidism   . Multinodular goiter   . Seizures ([place])     Past Surgical History:  Procedure Laterality Date  . ABDOMINAL HYSTERECTOMY  2002  . CESAREAN SECTION     x 2  . COLONOSCOPY N/A 07/04/2013   Procedure: COLONOSCOPY;  Surgeon: Milus Banister, MD;  Location: [facility];  Service: Endoscopy;  Laterality: N/A;    There were no vitals filed for this visit.      Subjective Assessment - 05/20/16 1015    Subjective feels a lot of cramping today beg in lateral L hip to anterior shin and great toe. Beats the knots out when it cramps. feels that mobility is much better but pain/numbness. Unable to stand upright through hip with feet close together.    Diagnostic tests X-ray: L4-L5 L5 -S1 spondylosis    Currently in Pain? Yes                         [facility] Adult PT Treatment/Exercise - 05/20/16 0001      Exercises   Exercises Lumbar;Knee/Hip     Lumbar Exercises: Stretches   Passive Hamstring Stretch Limitations supine with green strap, toward toes for DF stretch   Lower Trunk Rotation Limitations x5 each direction   Piriformis Stretch Limitations  figure 4 contract/relax     Lumbar Exercises: Supine   Other Supine Lumbar Exercises leg lengthener   Other Supine Lumbar Exercises hooklying ball squeeze     Lumbar Exercises: Prone   Other Prone Lumbar Exercises prone glut set     Knee/Hip Exercises: Standing   Gait Training cues for trunk rotation and stride length   Other Standing Knee Exercises standing glut sets   Other Standing Knee Exercises standing posture     Manual Therapy   Manual Therapy Joint mobilization   Joint Mobilization L fem-acetabular                PT Education - 05/20/16 1058    Education provided Yes   Education Details anatomy of condition, HEP, rationale, habit changes   Person(s) Educated Patient   Methods Explanation;Demonstration;Tactile cues;Verbal cues;Handout   Comprehension Verbalized understanding;Returned demonstration;Verbal cues required;Tactile cues required;Need further instruction          PT Short Term Goals - 05/05/16 1011      PT SHORT TERM GOAL #1   Title Patient will demsotrate a good core contraction    Baseline good contraction    Time 4   Period Weeks   Status New     PT SHORT TERM GOAL #2  Title Patient will report 2/10 pain at worst    Baseline 6/10 today up to a 10/10    Time 4   Period Weeks   Status On-going     PT SHORT TERM GOAL #3   Title Patient will increase L LE strength to 4+/5    Time 4   Period Weeks   Status On-going           PT Long Term Goals - 01/20/16 1749      PT LONG TERM GOAL #1   Title Patient will stand at work for 1 hour without increased back pain    Time 4   Period Weeks   Status New     PT LONG TERM GOAL #2   Title Patient will stand on with equal weight on her left and right legs without increased back pain    Time 4   Period Weeks   Status New     PT LONG TERM GOAL #3   Title Patient will ambulate 3000' without increased pain in order to go shopping    Time 4   Period Weeks   Status New     PT LONG  TERM GOAL #4   Title Patient will demonstrate a 43% deficit on FOTO    Time 8   Period Weeks   Status New               Plan - 05/20/16 1100    Clinical Impression Statement Notable upslip and post rotation of L innominate. [place] noted wtih grade 5 traction to L fem-acetabular joint which resulted in immediate increase in sensation in pt leg. Following stretches of muscles around the hip joint and activation of gluts, pt was able to stand upright with feet hip width apart without pain. Educated on habits in both static and dynamic standing positions to reduce return of symptoms. Explained that she may have some symptom return but the more she moves, the better. Instructed pt to contact the office by 10:30 tomorrow morning if symptoms return and to come in for further treatment.    PT Next Visit Plan core/glut activation patterns, illiopsoas stretch, long axis traction/ capsule mobs prn   PT Home Exercise Plan LTR, piriformis stretch, single knee to chest stretch. Right side bending; leg lengthener, figure 4 contract/relax, prone glut set, hooklying ball squeeze   Consulted and Agree with Plan of Care Patient      Patient will benefit from skilled therapeutic intervention in order to improve the following deficits and impairments:     Visit Diagnosis: Acute left-sided low back pain with left-sided sciatica  Muscle spasm of back  Muscle weakness (generalized)  Difficulty in walking, not elsewhere classified     Problem List Patient Active Problem List   Diagnosis Date Noted  . Lumbar radiculopathy 03/22/2014  . Greater trochanteric bursitis of left hip 03/22/2014  . Benign neoplasm of colon 07/04/2013  . Diverticulosis of colon (without mention of hemorrhage) 07/04/2013  . External hemorrhoids without mention of complication 81/19/1478  . Hypothyroidism following radioiodine therapy 06/08/2013  . Unspecified constipation 09/19/2012  . Sciatica neuralgia 09/06/2012   . Multinodular goiter 07/13/2012    Angely Dietz C. Zyla Dascenzo PT, DPT 05/20/16 11:14 AM   [place] [facility] [address] [place], [place], [address] Phone: [phone]   Fax:  [phone]  Name: Kimberly Hanson MRN: [id number] Date of Birth: 1965-06-14   PHYSICAL THERAPY DISCHARGE SUMMARY  Visits from Start of Care: 9  Current functional level related to goals / functional outcomes: See above   Remaining deficits: See above   Education / Equipment: Anatomy of condition, POC, HEP, exercise form/rationale  Plan: Patient agrees to discharge.  Patient goals were not met. Patient is being discharged due to not returning since the last visit.  ?????    Nazirah Tri C. Jazzmyn Filion PT, DPT 07/06/16 8:41 AM

## 2016-05-25 ENCOUNTER — Ambulatory Visit: Payer: BLUE CROSS/BLUE SHIELD | Admitting: Physical Therapy

## 2016-05-27 ENCOUNTER — Ambulatory Visit: Payer: BLUE CROSS/BLUE SHIELD | Admitting: Physical Therapy

## 2016-06-01 ENCOUNTER — Encounter: Payer: BLUE CROSS/BLUE SHIELD | Admitting: Physical Therapy

## 2016-06-03 ENCOUNTER — Encounter: Payer: BLUE CROSS/BLUE SHIELD | Admitting: Physical Therapy

## 2016-09-07 ENCOUNTER — Ambulatory Visit: Payer: BLUE CROSS/BLUE SHIELD | Attending: Internal Medicine | Admitting: Physical Therapy

## 2016-09-07 ENCOUNTER — Encounter: Payer: Self-pay | Admitting: Physical Therapy

## 2016-09-07 DIAGNOSIS — R262 Difficulty in walking, not elsewhere classified: Secondary | ICD-10-CM

## 2016-09-07 DIAGNOSIS — M62830 Muscle spasm of back: Secondary | ICD-10-CM

## 2016-09-07 DIAGNOSIS — M6281 Muscle weakness (generalized): Secondary | ICD-10-CM

## 2016-09-07 DIAGNOSIS — M5442 Lumbago with sciatica, left side: Secondary | ICD-10-CM | POA: Diagnosis not present

## 2016-09-07 NOTE — Therapy (Signed)
[place], [place], [address] Phone: [phone]   Fax:  [phone]  Physical Therapy Evaluation  Patient Details  Name: Kimberly Hanson MRN: [id number] Date of Birth: [birth year] Referring Provider: Dr Gala Romney   Encounter Date: 09/07/2016      PT End of Session - 09/07/16 2049    Visit Number 1   Number of Visits 16   Date for PT Re-Evaluation 11/02/16   Authorization Type BCBS    PT Start Time 1330   PT Stop Time 1414   PT Time Calculation (min) 44 min   Activity Tolerance Patient tolerated treatment well   Behavior During Therapy [facility] for tasks assessed/performed      Past Medical History:  Diagnosis Date  . Cholelithiasis    On CT 06/26/13  . Diverticulosis    On CT 06/26/13  . Hyperthyroidism   . Multinodular goiter   . Seizures ([place])     Past Surgical History:  Procedure Laterality Date  . ABDOMINAL HYSTERECTOMY  2002  . CESAREAN SECTION     x 2  . COLONOSCOPY N/A 07/04/2013   Procedure: COLONOSCOPY;  Surgeon: Milus Banister, MD;  Location: [facility];  Service: Endoscopy;  Laterality: N/A;    There were no vitals filed for this visit.       Subjective Assessment - 09/07/16 1345    Subjective Patient had a lumbar laminectomy on April 1st. Since that point she feels like her numbness has improved and her funtion of her left leg has improved. She continues to feel like her leg is heavey. She has    Pertinent History Siezures ( has not had for some time    Limitations Standing;Walking   How long can you sit comfortably? No more then 15-20 minutes in the car   How long can you stand comfortably? 5 minutes    How long can you walk comfortably? Leg feels heavey with ambualtion but pain has improved significantly    Diagnostic tests Nothing post op    Patient Stated Goals To return to work without pain/ return to gym    Currently in Pain? Yes   Pain Score 3   Pain can reach a 7/10     Pain Location Back   Pain Orientation Left   Pain Descriptors / Indicators Aching   Pain Type Chronic pain   Pain Radiating Towards Pain into the outside   Pain Onset More than a month ago   Pain Frequency Constant   Aggravating Factors  standing, walking    Pain Relieving Factors rest pain medication    Multiple Pain Sites No            [facility] PT Assessment - 09/07/16 0001      Assessment   Medical Diagnosis Lumbar laminectomy    Referring Provider Dr Gala Romney    Onset Date/Surgical Date 07/02/16   Hand Dominance Right   Next MD Visit End of June    Prior Therapy Yes had 2 bouts of therapy prior to surgery      Precautions   Precautions Back   Precaution Comments Dr never gave her strict percaustions 2nd to laminectomy but patient has been being careful with lifting and twisting.      Restrictions   Weight Bearing Restrictions No     Balance Screen   Has the patient fallen in the past 6 months No   Has the patient had a decrease in activity level  because of a fear of falling?  No   Is the patient reluctant to leave their home because of a fear of falling?  No     Home Environment   Additional Comments Steps into the second floor.      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; going to [employer]   Overall Cognitive Status Within Functional Limits for tasks assessed   Attention Focused   Focused Attention Appears intact   Memory Appears intact   Awareness Appears intact     Observation/Other Assessments   Focus on Therapeutic Outcomes (FOTO)  59% limitation      Sensation   Additional Comments Pain radiating into left buttock and weaknees into her left foot and ankle but significant improvements in movement and strength      Posture/Postural Control   Posture Comments Rounded shoulders; forward head      ROM /  Strength   AROM / PROM / Strength AROM;PROM;Strength     PROM   Overall PROM Comments Mobility limitations with internal and external rotation on the left hip    PROM Assessment Site Hip     Strength   Strength Assessment Site Hip;Knee;Ankle   Right/Left Hip Left   Left Hip Flexion 3+/5   Left Hip Extension 2/5   Left Hip ABduction 3+/5   Right/Left Knee Left   Left Knee Flexion 3+/5   Left Knee Extension 3/5   Right/Left Ankle Left   Left Ankle Dorsiflexion 2/5   Left Ankle Plantar Flexion 3+/5   Left Ankle Inversion 3+/5     Flexibility   Soft Tissue Assessment /Muscle Length yes   Hamstrings 90/90 hamstring length 4/5      Palpation   Palpation comment tender to plapationin the gluteus medius      Ambulation/Gait   Gait Comments Decreased single leg stance time on the left;increased left hip abduction in swing phase             Objective measurements completed on examination: See above findings.          [place] Adult PT Treatment/Exercise - 09/07/16 0001      Lumbar Exercises: Stretches   Lower Trunk Rotation Limitations x10    Piriformis Stretch Limitations Pirifromis stretch 3x20sec hold      Lumbar Exercises: Seated   LAQ on Chair Limitations 2x10 with cuing for pace      Ankle Exercises: Seated   Other Seated Ankle Exercises ankle 4 way                 PT Education - 09/07/16 2046    Education provided Yes   Education Details Extensive education on symptom management and slow progression of activity    Person(s) Educated Patient   Methods Explanation;Demonstration;Tactile cues;Verbal cues   Comprehension Verbalized understanding;Returned demonstration;Verbal cues required;Tactile cues required          PT Short Term Goals - 09/07/16 2059      PT SHORT TERM GOAL #1   Title Patient will demsotrate a good core contraction    Time 4   Period Weeks   Status New     PT SHORT TERM GOAL #2   Title Patient will report 2/10 pain at  worst    Baseline 5/10 pain at this time  Time 4   Period Weeks   Status On-going     PT SHORT TERM GOAL #3   Title Patient will increase L LE strength to 4+/5    Time 4   Period Weeks   Status New     PT SHORT TERM GOAL #4   Title Patient will be independent with intial program for core stregthening    Time 4   Period Weeks   Status New           PT Long Term Goals - 09/07/16 2100      PT LONG TERM GOAL #1   Title Patient will demsotrate 4+/5 hip flexion strength in order to improve ability to ambualte.    Time 8   Period Weeks   Status New     PT LONG TERM GOAL #2   Title Patient will put shoes on without pain and stiffness    Time 8   Period Weeks   Status New     PT LONG TERM GOAL #3   Title Patient will return to gym with light program to promote core strength and stability   Time 8   Period Weeks   Status New     PT LONG TERM GOAL #4   Title Patient will demonstrate a 45% deficit on FOTO    Time [place] - 09/07/16 2050    Clinical Impression Statement Patient is a 56 year od female S/P Laminectomy on March 1st 2018. She presents with decreased strength in her left leg and minor pain in her lower back. She has increased pain and weakness with activity. She would benefit from skilled therapy to increase strength and increase her ability to perfrom functional tasks. She is currently out of work. she would benefit from skilled therapy to adress the above defcits.    History and Personal Factors relevant to plan of care: Wekness from prior repaired nerve compression    Clinical Presentation Stable   Clinical Decision Making Low   Rehab Potential Good   PT Frequency 2x / week   PT Duration 8 weeks   PT Treatment/Interventions ADLs/Self Care Home Management;Cryotherapy;Electrical Stimulation;Iontophoresis 4mg /ml Dexamethasone;Gait training;Stair training;Moist Heat;Therapeutic activities;Therapeutic  exercise;Patient/family education;Neuromuscular re-education;Manual techniques;Passive range of motion;Dry needling;Taping   PT Next Visit Plan add in light core strengthening; work on hip flexion strengthening; review abdominal breathing, Continue with quad strengthening; consider quad and glut sets;    PT Home Exercise Plan LAQ; ankle 4 way; pirifromis stretch, lateral trunk rotation   Consulted and Agree with Plan of Care Patient      Patient will benefit from skilled therapeutic intervention in order to improve the following deficits and impairments:  Abnormal gait, Decreased range of motion, Pain, Increased muscle spasms, Decreased activity tolerance, Decreased strength, Decreased endurance  Visit Diagnosis: Acute left-sided low back pain with left-sided sciatica - Plan: PT plan of care cert/re-cert  Muscle spasm of back - Plan: PT plan of care cert/re-cert  Muscle weakness (generalized) - Plan: PT plan of care cert/re-cert  Difficulty in walking, not elsewhere classified - Plan: PT plan of care cert/re-cert     Problem List Patient Active Problem List   Diagnosis Date Noted  . Lumbar radiculopathy 03/22/2014  . Greater trochanteric bursitis of left hip 03/22/2014  . Benign neoplasm of colon 07/04/2013  . Diverticulosis of colon (without mention of  hemorrhage) 07/04/2013  . External hemorrhoids without mention of complication 46/65/9935  . Hypothyroidism following radioiodine therapy 06/08/2013  . Unspecified constipation 09/19/2012  . Sciatica neuralgia 09/06/2012  . Multinodular goiter 07/13/2012    Carney Living PT DPT  09/07/2016, 9:19 PM  [facility] [address] [place], [place], [address] Phone: [phone]   Fax:  [phone]  Name: Kimberly Hanson MRN: [id number] Date of Birth: 14-May-1965

## 2016-09-15 ENCOUNTER — Ambulatory Visit: Payer: BLUE CROSS/BLUE SHIELD | Admitting: Physical Therapy

## 2016-09-21 ENCOUNTER — Ambulatory Visit: Payer: BLUE CROSS/BLUE SHIELD | Admitting: Physical Therapy

## 2016-09-21 ENCOUNTER — Encounter: Payer: Self-pay | Admitting: Physical Therapy

## 2016-09-21 DIAGNOSIS — R262 Difficulty in walking, not elsewhere classified: Secondary | ICD-10-CM

## 2016-09-21 DIAGNOSIS — M62830 Muscle spasm of back: Secondary | ICD-10-CM

## 2016-09-21 DIAGNOSIS — M5442 Lumbago with sciatica, left side: Secondary | ICD-10-CM

## 2016-09-21 DIAGNOSIS — M6281 Muscle weakness (generalized): Secondary | ICD-10-CM

## 2016-09-22 NOTE — Therapy (Signed)
[place], [place], [address] Phone: [phone]   Fax:  [phone]  Physical Therapy Treatment  Patient Details  Name: Kimberly Hanson MRN: [id number] Date of Birth: 02/26/1966 Referring Provider: Dr Gala Romney   Encounter Date: 09/21/2016      PT End of Session - 09/21/16 1433    Visit Number 2   Number of Visits 16   Date for PT Re-Evaluation 11/02/16   Authorization Type BCBS    PT Start Time 1419   PT Stop Time 1459   PT Time Calculation (min) 40 min   Activity Tolerance Patient tolerated treatment well   Behavior During Therapy [facility] for tasks assessed/performed      Past Medical History:  Diagnosis Date  . Cholelithiasis    On CT 06/26/13  . Diverticulosis    On CT 06/26/13  . Hyperthyroidism   . Multinodular goiter   . Seizures ([place])     Past Surgical History:  Procedure Laterality Date  . ABDOMINAL HYSTERECTOMY  2002  . CESAREAN SECTION     x 2  . COLONOSCOPY N/A 07/04/2013   Procedure: COLONOSCOPY;  Surgeon: Milus Banister, MD;  [facility];  Service: Endoscopy;  Laterality: N/A;    There were no vitals filed for this visit.      Subjective Assessment - 09/21/16 1423    Subjective Patient reports the last week she has been a little sore. She has had soreness in the back of her leg but no shooting. She has been doing a lot of walking.    Pertinent History Siezures ( has not had for some time    Limitations Standing;Walking   How long can you sit comfortably? No more then 15-20 minutes in the car   How long can you stand comfortably? 5 minutes    How long can you walk comfortably? Leg feels heavey with ambualtion but pain has improved significantly    Diagnostic tests Nothing post op    Patient Stated Goals To return to work without pain/ return to gym    Currently in Pain? Yes   Pain Score 6    Pain Location Back   Pain Orientation Left   Pain Descriptors /  Indicators Aching   Pain Type Chronic pain   Pain Radiating Towards pain into the left leg    Pain Onset More than a month ago   Aggravating Factors  stnading and walking    Pain Relieving Factors rest and pain medication    Multiple Pain Sites No                         [facility] Adult PT Treatment/Exercise - 09/22/16 0001      Lumbar Exercises: Stretches   Passive Hamstring Stretch Limitations 3x20sec    Lower Trunk Rotation Limitations x10    Piriformis Stretch Limitations Pirifromis stretch 3x20sec hold      Lumbar Exercises: Seated   LAQ on Chair Limitations 2x10 with cuing for pace      Lumbar Exercises: Supine   AB Set Limitations 2x10    Clam Limitations 2x10 with cuing for abdominal breathing    Bent Knee Raise Limitations 2x10 with cuing for abdominal bracing    Other Supine Lumbar Exercises ball squeeze with abdominal bracing      Manual Therapy   Manual therapy comments soft tissue mobilization to the lumbar supine in sidelying right. Reviewed self soft tissue mobilization  with a tennis ball.      Ankle Exercises: Seated   Other Seated Ankle Exercises ankle 4 way red                 PT Education - 09/21/16 1432    Education provided Yes   Education Details education on technique with stretching    Person(s) Educated Patient   Methods Explanation;Demonstration;Tactile cues;Verbal cues   Comprehension Verbalized understanding;Returned demonstration;Verbal cues required;Tactile cues required          PT Short Term Goals - 09/07/16 2059      PT SHORT TERM GOAL #1   Title Patient will demsotrate a good core contraction    Time 4   Period Weeks   Status New     PT SHORT TERM GOAL #2   Title Patient will report 2/10 pain at worst    Baseline 5/10 pain at this time    Time 4   Period Weeks   Status On-going     PT SHORT TERM GOAL #3   Title Patient will increase L LE strength to 4+/5    Time 4   Period Weeks   Status New     PT  SHORT TERM GOAL #4   Title Patient will be independent with intial program for core stregthening    Time 4   Period Weeks   Status New           PT Long Term Goals - 09/07/16 2100      PT LONG TERM GOAL #1   Title Patient will demsotrate 4+/5 hip flexion strength in order to improve ability to ambualte.    Time 8   Period Weeks   Status New     PT LONG TERM GOAL #2   Title Patient will put shoes on without pain and stiffness    Time 8   Period Weeks   Status New     PT LONG TERM GOAL #3   Title Patient will return to gym with light program to promote core strength and stability   Time 8   Period Weeks   Status New     PT LONG TERM GOAL #4   Title Patient will demonstrate a 45% deficit on FOTO    Time 8   Period Weeks   Status New               Plan - 09/21/16 1638    Clinical Impression Statement Patient tolerated treatment well. she had no significant increase in pain with exercises. She has been working on her ankle strengthening at home. Therapy reviewed core exercises. She required cuing to keep her stomach tight. She has the exercises on paper at home from her previous course of PT.    History and Personal Factors relevant to plan of care: weakness from prior nerve compression.    Clinical Presentation Stable   Clinical Decision Making Low   Rehab Potential Good   PT Frequency 2x / week   PT Duration 8 weeks   PT Treatment/Interventions ADLs/Self Care Home Management;Cryotherapy;Electrical Stimulation;Iontophoresis 4mg /ml Dexamethasone;Gait training;Stair training;Moist Heat;Therapeutic activities;Therapeutic exercise;Patient/family education;Neuromuscular re-education;Manual techniques;Passive range of motion;Dry needling;Taping   PT Next Visit Plan add in light core strengthening; work on hip flexion strengthening; review abdominal breathing, Continue with quad strengthening; consider quad and glut sets;    PT Home Exercise Plan LAQ; ankle 4 way;  pirifromis stretch, lateral trunk rotation   Consulted and Agree with Plan of Care Patient  Patient will benefit from skilled therapeutic intervention in order to improve the following deficits and impairments:  Abnormal gait, Decreased range of motion, Pain, Increased muscle spasms, Decreased activity tolerance, Decreased strength, Decreased endurance  Visit Diagnosis: Acute left-sided low back pain with left-sided sciatica  Muscle spasm of back  Muscle weakness (generalized)  Difficulty in walking, not elsewhere classified     Problem List Patient Active Problem List   Diagnosis Date Noted  . Lumbar radiculopathy 03/22/2014  . Greater trochanteric bursitis of left hip 03/22/2014  . Benign neoplasm of colon 07/04/2013  . Diverticulosis of colon (without mention of hemorrhage) 07/04/2013  . External hemorrhoids without mention of complication 50/27/7412  . Hypothyroidism following radioiodine therapy 06/08/2013  . Unspecified constipation 09/19/2012  . Sciatica neuralgia 09/06/2012  . Multinodular goiter 07/13/2012    Carney Living PT DPT  09/22/2016, 9:10 AM  [facility] [address] [place], [place], [address] Phone: [phone]   Fax:  [phone]  Name: Kimberly Hanson MRN: [id number] Date of Birth: May 08, 1965

## 2016-09-23 ENCOUNTER — Ambulatory Visit: Payer: BLUE CROSS/BLUE SHIELD | Admitting: Physical Therapy

## 2016-09-28 ENCOUNTER — Ambulatory Visit: Payer: BLUE CROSS/BLUE SHIELD | Admitting: Physical Therapy

## 2016-09-28 DIAGNOSIS — M5442 Lumbago with sciatica, left side: Secondary | ICD-10-CM | POA: Diagnosis not present

## 2016-09-28 DIAGNOSIS — R262 Difficulty in walking, not elsewhere classified: Secondary | ICD-10-CM

## 2016-09-28 DIAGNOSIS — M62830 Muscle spasm of back: Secondary | ICD-10-CM

## 2016-09-28 DIAGNOSIS — M6281 Muscle weakness (generalized): Secondary | ICD-10-CM

## 2016-09-29 NOTE — Therapy (Signed)
[place], [place], [address] Phone: [phone]   Fax:  [phone]  Physical Therapy Treatment  Patient Details  Name: Kimberly Hanson MRN: [id number] Date of Birth: [birth year] Referring Provider: Dr Gala Romney   Encounter Date: 09/28/2016      PT End of Session - 09/29/16 1608    Visit Number 3   Number of Visits 16   Date for PT Re-Evaluation 11/02/16   Authorization Type BCBS    PT Start Time 6503   PT Stop Time 1418   PT Time Calculation (min) 41 min   Activity Tolerance Patient tolerated treatment well   Behavior During Therapy [facility] for tasks assessed/performed      Past Medical History:  Diagnosis Date  . Cholelithiasis    On CT 06/26/13  . Diverticulosis    On CT 06/26/13  . Hyperthyroidism   . Multinodular goiter   . Seizures ([place])     Past Surgical History:  Procedure Laterality Date  . ABDOMINAL HYSTERECTOMY  2002  . CESAREAN SECTION     x 2  . COLONOSCOPY N/A 07/04/2013   Procedure: COLONOSCOPY;  Surgeon: Milus Banister, MD;  [facility];  Service: Endoscopy;  Laterality: N/A;    There were no vitals filed for this visit.      Subjective Assessment - 09/29/16 1600    Subjective Patient reports soreness in her foot and lateral leg. She feels like it is not shooting pain just muscle soreness. She admits she has been doing a lot of activity at home.    Pertinent History Siezures ( has not had for some time    Limitations Standing;Walking   How long can you sit comfortably? No more then 15-20 minutes in the car   How long can you stand comfortably? 5 minutes    How long can you walk comfortably? Leg feels heavey with ambualtion but pain has improved significantly    Diagnostic tests Nothing post op    Patient Stated Goals To return to work without pain/ return to gym    Currently in Pain? Yes   Pain Score 6    Pain Location Back   Pain Orientation Left   Pain  Descriptors / Indicators Aching   Pain Type Chronic pain   Pain Radiating Towards pain into the leg    Pain Onset More than a month ago   Pain Frequency Constant   Aggravating Factors  standing and walking    Pain Relieving Factors rest and pain medication                          [facility] Adult PT Treatment/Exercise - 09/29/16 0001      Lumbar Exercises: Stretches   Passive Hamstring Stretch Limitations 3x20sec    Lower Trunk Rotation Limitations x10    Piriformis Stretch Limitations Pirifromis stretch 3x20sec hold      Lumbar Exercises: Seated   LAQ on Chair Limitations 2x10 with cuing for pace      Lumbar Exercises: Supine   AB Set Limitations 2x10    Clam Limitations 2x10 with cuing for abdominal breathing    Bent Knee Raise Limitations 2x10 with cuing for abdominal bracing    Other Supine Lumbar Exercises ball squeeze with abdominal bracing      Manual Therapy   Manual therapy comments soft tissue mobilization to the lumbar supine in sidelying right. left hip and glut roll out; Light  LAD in supine to improve hip mobility.                 PT Education - 09/29/16 1608    Education provided Yes   Education Details education on stretching and strengthening    Person(s) Educated Patient   Methods Explanation;Demonstration;Tactile cues;Verbal cues   Comprehension Verbalized understanding;Returned demonstration;Verbal cues required;Tactile cues required          PT Short Term Goals - 09/07/16 2059      PT SHORT TERM GOAL #1   Title Patient will demsotrate a good core contraction    Time 4   Period Weeks   Status New     PT SHORT TERM GOAL #2   Title Patient will report 2/10 pain at worst    Baseline 5/10 pain at this time    Time 4   Period Weeks   Status On-going     PT SHORT TERM GOAL #3   Title Patient will increase L LE strength to 4+/5    Time 4   Period Weeks   Status New     PT SHORT TERM GOAL #4   Title Patient will be  independent with intial program for core stregthening    Time 4   Period Weeks   Status New           PT Long Term Goals - 09/07/16 2100      PT LONG TERM GOAL #1   Title Patient will demsotrate 4+/5 hip flexion strength in order to improve ability to ambualte.    Time 8   Period Weeks   Status New     PT LONG TERM GOAL #2   Title Patient will put shoes on without pain and stiffness    Time 8   Period Weeks   Status New     PT LONG TERM GOAL #3   Title Patient will return to gym with light program to promote core strength and stability   Time 8   Period Weeks   Status New     PT LONG TERM GOAL #4   Title Patient will demonstrate a 45% deficit on FOTO    Time 8   Period Weeks   Status New               Plan - 09/29/16 1609    Clinical Impression Statement Patient advised to make sure she does not over-do it at home. She was also advised to use her stretches. Therapy reviewed core strengthening and left ower extremity strengthening. No new goals met.    Clinical Presentation Stable   Clinical Decision Making Low   Rehab Potential Good   PT Frequency 2x / week   PT Duration 8 weeks   PT Treatment/Interventions ADLs/Self Care Home Management;Cryotherapy;Electrical Stimulation;Iontophoresis 38m/ml Dexamethasone;Gait training;Stair training;Moist Heat;Therapeutic activities;Therapeutic exercise;Patient/family education;Neuromuscular re-education;Manual techniques;Passive range of motion;Dry needling;Taping   PT Next Visit Plan add in light core strengthening; work on hip flexion strengthening; review abdominal breathing, Continue with quad strengthening; consider quad and glut sets;    PT Home Exercise Plan LAQ; ankle 4 way; pirifromis stretch, lateral trunk rotation   Consulted and Agree with Plan of Care Patient      Patient will benefit from skilled therapeutic intervention in order to improve the following deficits and impairments:  Abnormal gait, Decreased  range of motion, Pain, Increased muscle spasms, Decreased activity tolerance, Decreased strength, Decreased endurance  Visit Diagnosis: Acute left-sided low back pain with left-sided  sciatica  Muscle spasm of back  Muscle weakness (generalized)  Difficulty in walking, not elsewhere classified     Problem List Patient Active Problem List   Diagnosis Date Noted  . Lumbar radiculopathy 03/22/2014  . Greater trochanteric bursitis of left hip 03/22/2014  . Benign neoplasm of colon 07/04/2013  . Diverticulosis of colon (without mention of hemorrhage) 07/04/2013  . External hemorrhoids without mention of complication 17/83/7542  . Hypothyroidism following radioiodine therapy 06/08/2013  . Unspecified constipation 09/19/2012  . Sciatica neuralgia 09/06/2012  . Multinodular goiter 07/13/2012    Carney Living PT DPT  09/29/2016, 4:11 PM  [facility] [address] [place], [place], [address] Phone: [phone]   Fax:  [phone]  Name: Kimberly Hanson MRN: [id number] Date of Birth: April 26, 1965

## 2016-09-30 ENCOUNTER — Ambulatory Visit: Payer: BLUE CROSS/BLUE SHIELD | Admitting: Physical Therapy

## 2016-10-05 ENCOUNTER — Ambulatory Visit: Payer: BLUE CROSS/BLUE SHIELD | Attending: Internal Medicine | Admitting: Physical Therapy

## 2016-10-05 ENCOUNTER — Encounter: Payer: Self-pay | Admitting: Physical Therapy

## 2016-10-05 DIAGNOSIS — R262 Difficulty in walking, not elsewhere classified: Secondary | ICD-10-CM | POA: Diagnosis present

## 2016-10-05 DIAGNOSIS — M5442 Lumbago with sciatica, left side: Secondary | ICD-10-CM | POA: Diagnosis present

## 2016-10-05 DIAGNOSIS — M6281 Muscle weakness (generalized): Secondary | ICD-10-CM | POA: Diagnosis present

## 2016-10-05 DIAGNOSIS — M62830 Muscle spasm of back: Secondary | ICD-10-CM | POA: Diagnosis present

## 2016-10-05 NOTE — Therapy (Addendum)
[place], [place], [address] Phone: [phone]   Fax:  [phone]  Physical Therapy Treatment/ Discharge   Patient Details  Name: Kimberly Hanson MRN: [id number] Date of Birth: [birth year] Referring Provider: Dr Gala Romney   Encounter Date: 10/05/2016      PT End of Session - 10/05/16 1349    Visit Number 4   Number of Visits 16   Date for PT Re-Evaluation 11/02/16   Authorization Type BC[employer]    PT Start Time 1330   PT Stop Time 1415   PT Time Calculation (min) 45 min   Activity Tolerance Patient tolerated treatment well   Behavior During Therapy [facility] for tasks assessed/performed      Past Medical History:  Diagnosis Date  . Cholelithiasis    On CT 06/26/13  . Diverticulosis    On CT 06/26/13  . Hyperthyroidism   . Multinodular goiter   . Seizures ([place])     Past Surgical History:  Procedure Laterality Date  . ABDOMINAL HYSTERECTOMY  2002  . CESAREAN SECTION     x 2  . COLONOSCOPY N/A 07/04/2013   Procedure: COLONOSCOPY;  Surgeon: Milus Banister, MD;  Location: [facility];  Service: Endoscopy;  Laterality: N/A;    There were no vitals filed for this visit.      Subjective Assessment - 10/05/16 1337    Subjective Patien reports her pain is getting worse and she is starting to feel her numbness come back. She reports she has been driving ling distances and has been going up and down the stairs frequenlty. She was advised she needs to moderate her activity or she will do damage to her back.    Pertinent History Siezures ( has not had for some time    Limitations Standing;Walking   How long can you sit comfortably? No more then 15-20 minutes in the car   How long can you stand comfortably? 5 minutes    How long can you walk comfortably? Leg feels heavey with ambualtion but pain has improved significantly    Diagnostic tests Nothing post op    Patient Stated Goals To return to work without  pain/ return to gym    Currently in Pain? Yes   Pain Score 7    Pain Location Back   Pain Orientation Left   Pain Descriptors / Indicators Aching   Pain Type Chronic pain   Pain Onset More than a month ago   Pain Frequency Constant   Aggravating Factors  standing and walking    Multiple Pain Sites No                         [facility] Adult PT Treatment/Exercise - 10/05/16 0001      Lumbar Exercises: Stretches   Passive Hamstring Stretch Limitations 3x20sec    Lower Trunk Rotation Limitations x10    Piriformis Stretch Limitations Pirifromis stretch 3x20sec hold      Lumbar Exercises: Seated   LAQ on Chair Limitations 2x10 with cuing for pace      Lumbar Exercises: Supine   Clam Limitations 2x10 with cuing for abdominal breathing    Bent Knee Raise Limitations 10 with cuing for abdominal bracing    Other Supine Lumbar Exercises ball squeeze with abdominal bracing      Knee/Hip Exercises: Standing   Heel Raises Limitations x20   Hip Flexion Limitations 2x10 cuing not to abduct    Other  Standing Knee Exercises hip extension with cuing for posture 2x10; hip abduction with cuing for posture 2x10                 PT Education - 10/05/16 1347    Education provided Yes   Education Details improtance of activity modification    Person(s) Educated Patient   Methods Explanation;Demonstration;Tactile cues;Verbal cues   Comprehension Verbalized understanding;Returned demonstration;Verbal cues required;Tactile cues required          PT Short Term Goals - 09/07/16 2059      PT SHORT TERM GOAL #1   Title Patient will demsotrate a good core contraction    Time 4   Period Weeks   Status New     PT SHORT TERM GOAL #2   Title Patient will report 2/10 pain at worst    Baseline 5/10 pain at this time    Time 4   Period Weeks   Status On-going     PT SHORT TERM GOAL #3   Title Patient will increase L LE strength to 4+/5    Time 4   Period Weeks   Status  New     PT SHORT TERM GOAL #4   Title Patient will be independent with intial program for core stregthening    Time 4   Period Weeks   Status New           PT Long Term Goals - 09/07/16 2100      PT LONG TERM GOAL #1   Title Patient will demsotrate 4+/5 hip flexion strength in order to improve ability to ambualte.    Time 8   Period Weeks   Status New     PT LONG TERM GOAL #2   Title Patient will put shoes on without pain and stiffness    Time 8   Period Weeks   Status New     PT LONG TERM GOAL #3   Title Patient will return to gym with light program to promote core strength and stability   Time 8   Period Weeks   Status New     PT LONG TERM GOAL #4   Title Patient will demonstrate a 45% deficit on FOTO    Time 8   Period Weeks   Status New               Plan - 10/05/16 1518    Clinical Impression Statement Extensive education provided on the improtance of activity modification. She is verhy sore in her lateral hip and glut medius. She was advised that if she continues to walk past the point of what she can tolerate without the strength she needs her pain will continue to get worse. patient reports her bed is difficult for her to do exercises, becuase is is soft. She was given standing exercises to do. She required min cuing.    Rehab Potential Good   PT Frequency 2x / week   PT Duration 8 weeks   PT Treatment/Interventions ADLs/Self Care Home Management;Cryotherapy;Electrical Stimulation;Iontophoresis '4mg'$ /ml Dexamethasone;Gait training;Stair training;Moist Heat;Therapeutic activities;Therapeutic exercise;Patient/family education;Neuromuscular re-education;Manual techniques;Passive range of motion;Dry needling;Taping   PT Next Visit Plan add in light core strengthening; work on hip flexion strengthening; review abdominal breathing, Continue with quad strengthening; consider quad and glut sets;    PT Home Exercise Plan LAQ; ankle 4 way; pirifromis stretch,  lateral trunk rotation   Consulted and Agree with Plan of Care Patient      Patient will benefit from  skilled therapeutic intervention in order to improve the following deficits and impairments:  Abnormal gait, Decreased range of motion, Pain, Increased muscle spasms, Decreased activity tolerance, Decreased strength, Decreased endurance  Visit Diagnosis: Acute left-sided low back pain with left-sided sciatica  Muscle spasm of back  Muscle weakness (generalized)  Difficulty in walking, not elsewhere classified   PHYSICAL THERAPY DISCHARGE SUMMARY  Visits from Start of Care: 4  Current functional level related to goals / functional outcomes: Did not show for last visit. Continued to have pain but was likley doing too much at home.    Remaining deficits: Pain    Education / Equipment:   Plan: Patient agrees to discharge.  Patient goals were not met. Patient is being discharged due to not returning since the last visit.  ?????      Problem List Patient Active Problem List   Diagnosis Date Noted  . Lumbar radiculopathy 03/22/2014  . Greater trochanteric bursitis of left hip 03/22/2014  . Benign neoplasm of colon 07/04/2013  . Diverticulosis of colon (without mention of hemorrhage) 07/04/2013  . External hemorrhoids without mention of complication 03/83/3383  . Hypothyroidism following radioiodine therapy 06/08/2013  . Unspecified constipation 09/19/2012  . Sciatica neuralgia 09/06/2012  . Multinodular goiter 07/13/2012    Carney Living PT DPT  10/05/2016, 3:37 PM  [facility] [address] [place], [place], [address] Phone: [phone]   Fax:  [phone]  Name: Kimberly Hanson MRN: [id number] Date of Birth: 07-Dec-1965

## 2016-10-07 ENCOUNTER — Ambulatory Visit: Payer: BLUE CROSS/BLUE SHIELD | Admitting: Physical Therapy

## 2016-10-11 ENCOUNTER — Ambulatory Visit: Payer: BLUE CROSS/BLUE SHIELD | Admitting: Physical Therapy

## 2016-10-19 ENCOUNTER — Ambulatory Visit: Payer: BLUE CROSS/BLUE SHIELD | Admitting: Physical Therapy

## 2017-03-16 ENCOUNTER — Other Ambulatory Visit: Payer: Self-pay | Admitting: Endocrinology

## 2017-03-16 NOTE — Telephone Encounter (Signed)
Please refill x 1 Ov is due  

## 2017-06-17 ENCOUNTER — Other Ambulatory Visit: Payer: Self-pay | Admitting: Endocrinology

## 2017-06-17 ENCOUNTER — Telehealth: Payer: Self-pay | Admitting: Endocrinology

## 2017-06-17 NOTE — Telephone Encounter (Signed)
levothyroxine (SYNTHROID, LEVOTHROID) 175 MCG tablet  Patient states that pharmacy has sent over a paper to [place] for medication.  She is checking the status of this paper and would like to know if she can get a prescription refilled before her next appt.   Please advise

## 2017-06-17 NOTE — Telephone Encounter (Signed)
I have sent to pharmacy.

## 2017-07-04 ENCOUNTER — Encounter: Payer: Self-pay | Admitting: Endocrinology

## 2017-07-04 ENCOUNTER — Ambulatory Visit (INDEPENDENT_AMBULATORY_CARE_PROVIDER_SITE_OTHER): Payer: BLUE CROSS/BLUE SHIELD | Admitting: Endocrinology

## 2017-07-04 VITALS — BP 102/68 | HR 64 | Wt 245.6 lb

## 2017-07-04 DIAGNOSIS — E890 Postprocedural hypothyroidism: Secondary | ICD-10-CM | POA: Diagnosis not present

## 2017-07-04 LAB — T4, FREE: Free T4: 0.59 ng/dL — ABNORMAL LOW (ref 0.60–1.60)

## 2017-07-04 LAB — TSH: TSH: 27.48 u[IU]/mL — ABNORMAL HIGH (ref 0.35–4.50)

## 2017-07-04 MED ORDER — LEVOTHYROXINE SODIUM 200 MCG PO TABS: 200.0000 ug | ORAL_TABLET | Freq: Every day | ORAL | 3 refills | Status: AC

## 2017-07-04 NOTE — Patient Instructions (Addendum)
blood tests are requested for you today.  We'll let you know about the results.   Please return in 1 year.  

## 2017-07-04 NOTE — Progress Notes (Signed)
Subjective:    Patient ID: Kimberly Hanson, female    DOB: 1965/10/20, 52 y.o.   MRN: [id number]  HPI Pt returns for f/u of post-RAI hypothyroidism (in 2012, pt was noted to have hyperthyroidism, and a nodular goiter; after a bx (benign), she had RAI rx later in 2012; and a second dose of RAI in 2014; she then developed hypothyroidism, so she was started on synthroid; repeat US in 2017 showed smaller goiter, except the the dominant nodule was unchanged).   Pt says she never misses the synthroid.  pt reports dry skin and fatigue.   Past Medical History:  Diagnosis Date  . Cholelithiasis    On CT 06/26/13  . Diverticulosis    On CT 06/26/13  . Hyperthyroidism   . Multinodular goiter   . Seizures ([place])     Past Surgical History:  Procedure Laterality Date  . ABDOMINAL HYSTERECTOMY  2002  . CESAREAN SECTION     x 2  . COLONOSCOPY N/A 07/04/2013   Procedure: COLONOSCOPY;  Surgeon: Milus Banister, MD;  Location: [facility];  Service: Endoscopy;  Laterality: N/A;    Social History   Socioeconomic History  . Marital status: Single    Spouse name: Not on file  . Number of children: 2  . Years of education: 12+  . Highest education level: Not on file  Occupational History  . Occupation: Forensic psychologist: [employer]  . Financial resource strain: Not on file  . Food insecurity:    Worry: Not on file    Inability: Not on file  . Transportation needs:    Medical: Not on file    Non-medical: Not on file  Tobacco Use  . Smoking status: Current Every Day Smoker    Packs/day: 0.25    Years: 5.00    Pack years: 1.25    Types: Cigarettes  . Smokeless tobacco: Never Used  . Tobacco comment: tobacco info given 06/29/13  Substance and Sexual Activity  . Alcohol use: Yes    Comment: occasional  . Drug use: No  . Sexual activity: Yes  Lifestyle  . Physical activity:    Days per week: Not on file    Minutes per session: Not on file  . Stress: Not on file    Relationships  . Social connections:    Talks on phone: Not on file    Gets together: Not on file    Attends religious service: Not on file    Active member of club or organization: Not on file    Attends meetings of clubs or organizations: Not on file    Relationship status: Not on file  . Intimate partner violence:    Fear of current or ex partner: Not on file    Emotionally abused: Not on file    Physically abused: Not on file    Forced sexual activity: Not on file  Other Topics Concern  . Not on file  Social History Narrative   Regular exercise-no   Caffeine Use-yes    Current Outpatient Medications on File Prior to Visit  Medication Sig Dispense Refill  . acetaminophen (TYLENOL) 500 MG tablet Take 1,000 mg by mouth every 6 (six) hours as needed for mild pain.    Marland Kitchen albuterol (PROVENTIL HFA;VENTOLIN HFA) 108 (90 BASE) MCG/ACT inhaler Inhale 1-2 puffs into the lungs every 6 (six) hours as needed for wheezing or shortness of breath. 1 Inhaler 0  . meloxicam (MOBIC)  15 MG tablet Take 15 mg by mouth daily.    Marland Kitchen zolpidem (AMBIEN) 5 MG tablet   0   No current facility-administered medications on file prior to visit.     Allergies  Allergen Reactions  . Naproxen Anaphylaxis    Family History  Problem Relation Age of Onset  . Hypertension Mother   . Colon polyps Mother   . Colon cancer Neg Hx     BP 102/68 (BP Location: Left Arm, Patient Position: Sitting, Cuff Size: Normal)   Pulse 64   Wt 245 lb 9.6 oz (111.4 kg)   SpO2 99%   BMI 39.64 kg/m    Review of Systems Denies neck pain.      Objective:   Physical Exam VITAL SIGNS:  See vs page. GENERAL: no distress.  NECK: thyroid is approx twice normal size, with a multinodular surface, but I cannot palpate any nodule.       Assessment & Plan:  Hypothyroidism: due for recheck.  Patient Instructions  blood tests are requested for you today.  We'll let you know about the results.  Please return in 1 year.

## 2017-09-10 ENCOUNTER — Observation Stay (HOSPITAL_COMMUNITY)
Admission: EM | Admit: 2017-09-10 | Discharge: 2017-09-12 | Disposition: A | Discharge status: Home / Self Care | Payer: BLUE CROSS/BLUE SHIELD | Attending: Family Medicine | Admitting: Family Medicine

## 2017-09-10 ENCOUNTER — Ambulatory Visit (INDEPENDENT_AMBULATORY_CARE_PROVIDER_SITE_OTHER)
Admission: EM | Admit: 2017-09-10 | Discharge: 2017-09-10 | Disposition: A | Discharge status: Home / Self Care | Payer: BLUE CROSS/BLUE SHIELD | Source: Home / Self Care | Attending: Internal Medicine | Admitting: Internal Medicine

## 2017-09-10 ENCOUNTER — Encounter (HOSPITAL_COMMUNITY): Payer: Self-pay | Admitting: Emergency Medicine

## 2017-09-10 ENCOUNTER — Emergency Department (HOSPITAL_COMMUNITY): Payer: BLUE CROSS/BLUE SHIELD

## 2017-09-10 ENCOUNTER — Encounter (HOSPITAL_COMMUNITY): Payer: Self-pay | Admitting: *Deleted

## 2017-09-10 DIAGNOSIS — R6884 Jaw pain: Secondary | ICD-10-CM | POA: Diagnosis present

## 2017-09-10 DIAGNOSIS — F17210 Nicotine dependence, cigarettes, uncomplicated: Secondary | ICD-10-CM | POA: Diagnosis not present

## 2017-09-10 DIAGNOSIS — R001 Bradycardia, unspecified: Secondary | ICD-10-CM | POA: Diagnosis not present

## 2017-09-10 DIAGNOSIS — R110 Nausea: Secondary | ICD-10-CM | POA: Diagnosis not present

## 2017-09-10 DIAGNOSIS — Z79899 Other long term (current) drug therapy: Secondary | ICD-10-CM | POA: Insufficient documentation

## 2017-09-10 DIAGNOSIS — R0789 Other chest pain: Secondary | ICD-10-CM | POA: Diagnosis present

## 2017-09-10 DIAGNOSIS — M79602 Pain in left arm: Secondary | ICD-10-CM | POA: Diagnosis not present

## 2017-09-10 DIAGNOSIS — M542 Cervicalgia: Principal | ICD-10-CM | POA: Insufficient documentation

## 2017-09-10 DIAGNOSIS — R51 Headache: Secondary | ICD-10-CM | POA: Diagnosis not present

## 2017-09-10 DIAGNOSIS — E039 Hypothyroidism, unspecified: Secondary | ICD-10-CM | POA: Insufficient documentation

## 2017-09-10 DIAGNOSIS — R42 Dizziness and giddiness: Secondary | ICD-10-CM | POA: Diagnosis not present

## 2017-09-10 DIAGNOSIS — E890 Postprocedural hypothyroidism: Secondary | ICD-10-CM | POA: Diagnosis present

## 2017-09-10 DIAGNOSIS — R079 Chest pain, unspecified: Secondary | ICD-10-CM

## 2017-09-10 LAB — TROPONIN I

## 2017-09-10 LAB — I-STAT BETA HCG BLOOD, ED (MC, WL, AP ONLY): I-stat hCG, quantitative: <5 m[IU]/mL (ref <5)

## 2017-09-10 LAB — BASIC METABOLIC PANEL
ANION GAP: 6 (ref 5–15)
BUN: 8 mg/dL (ref 6–20)
CALCIUM: 8.8 mg/dL — AB (ref 8.9–10.3)
CO2: 23 mmol/L (ref 22–32)
CREATININE: 0.74 mg/dL (ref 0.44–1.00)
Chloride: 111 mmol/L (ref 101–111)
GFR calc Af Amer: >60 mL/min (ref >60)
GLUCOSE: 112 mg/dL — AB (ref 65–99)
Potassium: 3.9 mmol/L (ref 3.5–5.1)
Sodium: 140 mmol/L (ref 135–145)

## 2017-09-10 LAB — CBC
HCT: 35.1 % — ABNORMAL LOW (ref 36.0–46.0)
Hemoglobin: 11 g/dL — ABNORMAL LOW (ref 12.0–15.0)
MCH: 28.9 pg (ref 26.0–34.0)
MCHC: 31.3 g/dL (ref 30.0–36.0)
MCV: 92.4 fL (ref 78.0–100.0)
PLATELETS: 223 10*3/uL (ref 150–400)
RBC: 3.8 MIL/uL — ABNORMAL LOW (ref 3.87–5.11)
RDW: 14.4 % (ref 11.5–15.5)
WBC: 6 10*3/uL (ref 4.0–10.5)

## 2017-09-10 LAB — I-STAT TROPONIN, ED
TROPONIN I, POC: 0 ng/mL (ref 0.00–0.08)
Troponin i, poc: 0 ng/mL (ref 0.00–0.08)

## 2017-09-10 LAB — TSH: TSH: 3.031 u[IU]/mL (ref 0.350–4.500)

## 2017-09-10 LAB — D-DIMER, QUANTITATIVE (NOT AT ARMC): D DIMER QUANT: 0.38 ug{FEU}/mL (ref 0.00–0.50)

## 2017-09-10 LAB — CK: Total CK: 156 U/L (ref 38–234)

## 2017-09-10 LAB — T4, FREE: Free T4: 0.94 ng/dL (ref 0.82–1.77)

## 2017-09-10 MED ORDER — IOPAMIDOL (ISOVUE-370) INJECTION 76%
50.0000 mL | Freq: Once | INTRAVENOUS | Status: AC | PRN
  Administered 2017-09-10: 50 mL via INTRAVENOUS

## 2017-09-10 MED ORDER — MORPHINE SULFATE (PF) 4 MG/ML IV SOLN
4.0000 mg | Freq: Once | INTRAVENOUS | Status: AC
  Administered 2017-09-10: 4 mg via INTRAVENOUS
  Filled 2017-09-10: qty 1

## 2017-09-10 MED ORDER — SODIUM CHLORIDE 0.9 % IV BOLUS
500.0000 mL | Freq: Once | INTRAVENOUS | Status: AC
  Administered 2017-09-10: 500 mL via INTRAVENOUS

## 2017-09-10 MED ORDER — PROMETHAZINE HCL 25 MG/ML IJ SOLN
12.5000 mg | Freq: Once | INTRAMUSCULAR | Status: AC
  Administered 2017-09-10: 12.5 mg via INTRAVENOUS
  Filled 2017-09-10: qty 1

## 2017-09-10 MED ORDER — IOPAMIDOL (ISOVUE-370) INJECTION 76%
INTRAVENOUS | Status: AC
  Filled 2017-09-10: qty 50

## 2017-09-10 NOTE — ED Notes (Signed)
Pt heard yelling on phone multiple times, went to check on patient and patient said she wants to leave. RN talked to patient about her situation and why she so urgently needs to leave. Pt has a lot of stressors at home. Pt talked to about why she is being admitted and why it is more safe for here to stay here if possible. Pt very pleasant, still crying over family members but patient thankful for help. TV turned on and lights turned down for pt comfort. Pt does want to stay for admission

## 2017-09-10 NOTE — ED Provider Notes (Signed)
[place] EMERGENCY DEPARTMENT Provider Note   CSN: [id number] Arrival date & time: 09/10/17  1230     History   Chief Complaint Chief Complaint  Patient presents with  . Chest Pain    HPI Kimberly Hanson is a 51 y.o. female with a past medical history of seizures, hypothyroid, who presents today for evaluation of chest pain.  Her chest pain woke her up from her sleep this morning.  When she initially woke up she was nauseous, diaphoretic, and felt mildly short of breath.  Her chest pressure radiates to the left side of her jaw and down her left arm.  She denies any known cardiac history.  She reports compliance with her medications.  No history of bradycardia.  She also reports headache, mostly on the left side.  Her pain is associated with subjective blurred left vision.  She denies any recent trauma.    HPI  Past Medical History:  Diagnosis Date  . Cholelithiasis    On CT 06/26/13  . Diverticulosis    On CT 06/26/13  . Hyperthyroidism   . Multinodular goiter   . Seizures [facility])     Patient Active Problem List   Diagnosis Date Noted  . Lumbar radiculopathy 03/22/2014  . Greater trochanteric bursitis of left hip 03/22/2014  . Benign neoplasm of colon 07/04/2013  . Diverticulosis of colon (without mention of hemorrhage) 07/04/2013  . External hemorrhoids without mention of complication 08/65/7846  . Hypothyroidism following radioiodine therapy 06/08/2013  . Unspecified constipation 09/19/2012  . Sciatica neuralgia 09/06/2012  . Multinodular goiter 07/13/2012    Past Surgical History:  Procedure Laterality Date  . ABDOMINAL HYSTERECTOMY  2002  . CESAREAN SECTION     x 2  . COLONOSCOPY N/A 07/04/2013   Procedure: COLONOSCOPY;  Surgeon: Milus Banister, MD;  Location: [facility] ENDOSCOPY;  Service: Endoscopy;  Laterality: N/A;     OB History   None      Home Medications    Prior to Admission medications   Medication Sig Start Date End Date Taking?  Authorizing Provider  acetaminophen (TYLENOL) 500 MG tablet Take 1,000 mg by mouth every 6 (six) hours as needed for mild pain.    [provider]  albuterol (PROVENTIL HFA;VENTOLIN HFA) 108 (90 BASE) MCG/ACT inhaler Inhale 1-2 puffs into the lungs every 6 (six) hours as needed for wheezing or shortness of breath. 03/03/14   Forcucci, Courtney, PA-C  levothyroxine (SYNTHROID, LEVOTHROID) 200 MCG tablet Take 1 tablet (200 mcg total) by mouth daily before breakfast. 07/04/17   Renato Shin, MD  meloxicam (MOBIC) 15 MG tablet Take 15 mg by mouth daily.    [provider]  zolpidem (AMBIEN) 5 MG tablet  02/16/16   [provider]    Family History Family History  Problem Relation Age of Onset  . Hypertension Mother   . Colon polyps Mother   . Heart attack Mother   . Colon cancer Neg Hx     Social History Social History   Tobacco Use  . Smoking status: Current Every Day Smoker    Packs/day: 0.50    Years: 5.00    Pack years: 2.50    Types: Cigarettes  . Smokeless tobacco: Never Used  . Tobacco comment: tobacco info given 06/29/13  Substance Use Topics  . Alcohol use: Yes    Comment: occasional  . Drug use: No     Allergies   Naproxen   Review of Systems Review of Systems  Constitutional: Positive for diaphoresis. Negative for chills and fever.  HENT: Negative for ear pain and sore throat.   Eyes: Negative for pain and visual disturbance.  Respiratory: Positive for chest tightness and shortness of breath. Negative for cough.   Cardiovascular: Positive for chest pain. Negative for palpitations.  Gastrointestinal: Positive for nausea. Negative for abdominal pain and vomiting.  Genitourinary: Negative for dysuria and hematuria.  Musculoskeletal: Positive for neck pain. Negative for arthralgias and back pain.  Skin: Negative for color change and rash.  Neurological: Positive for headaches. Negative for seizures and syncope.  All other systems reviewed  and are negative.    Physical Exam Updated Vital Signs BP 104/65   Pulse (!) 45   Temp 99 F (37.2 C) (Oral)   Resp 13   SpO2 99%   Physical Exam  Constitutional: She is oriented to person, place, and time. She appears well-developed and well-nourished.  Non-toxic appearance. No distress.  HENT:  Head: Normocephalic and atraumatic.  Eyes: Pupils are equal, round, and reactive to light. Conjunctivae and EOM are normal.  Neck: Normal range of motion. Neck supple.  Cardiovascular: Normal rate, regular rhythm, intact distal pulses and normal pulses.  No murmur heard. Pulmonary/Chest: Effort normal and breath sounds normal. No respiratory distress. She has no decreased breath sounds. She has no wheezes. She has no rhonchi. She has no rales.  Abdominal: Soft. Bowel sounds are normal. She exhibits no distension. There is no tenderness.  Musculoskeletal: Normal range of motion. She exhibits no edema.       Right lower leg: Normal. She exhibits no tenderness and no edema.       Left lower leg: Normal. She exhibits no tenderness and no edema.  Neurological: She is alert and oriented to person, place, and time. She has normal strength. No cranial nerve deficit.  Skin: Skin is warm and dry.  Psychiatric: She has a normal mood and affect. Her behavior is normal.  Nursing note and vitals reviewed.    ED Treatments / Results  Labs (all labs ordered are listed, but only abnormal results are displayed) Labs Reviewed  BASIC METABOLIC PANEL - Abnormal; Notable for the following components:      Result Value   Glucose, Bld 112 (*)    Calcium 8.8 (*)    All other components within normal limits  CBC - Abnormal; Notable for the following components:   RBC 3.80 (*)    Hemoglobin 11.0 (*)    HCT 35.1 (*)    All other components within normal limits  D-DIMER, QUANTITATIVE (NOT AT [facility])  CK  TSH  I-STAT TROPONIN, ED  I-STAT BETA HCG BLOOD, ED ([facility], [facility], [facility] ONLY)  I-STAT TROPONIN, ED     EKG EKG Interpretation  Date/Time:  Saturday September 10 2017 15:11:48 EDT Ventricular Rate:  41 PR Interval:    QRS Duration: 95 QT Interval:  449 QTC Calculation: 371 R Axis:   65 Text Interpretation:  Normal sinus rhythm Anteroseptal infarct, old No significant change since last tracing Confirmed by Wandra Arthurs [phone]) on 09/10/2017 4:10:40 PM   Radiology Ct Angio Head W Or Wo Contrast  Result Date: 09/10/2017 CLINICAL DATA:  Left neck swelling.  Left neck pain. EXAM: CT ANGIOGRAPHY HEAD AND NECK TECHNIQUE: Multidetector CT imaging of the head and neck was performed using the standard protocol during bolus administration of intravenous contrast. Multiplanar CT image reconstructions and MIPs were obtained to evaluate the vascular anatomy. Carotid stenosis measurements (when applicable) are  obtained utilizing NASCET criteria, using the distal internal carotid diameter as the denominator. CONTRAST:  9mL ISOVUE-370 IOPAMIDOL (ISOVUE-370) INJECTION 76% COMPARISON:  CT of the head doubt contrast same day. CT cervical spine without contrast the same day. FINDINGS: CTA NECK FINDINGS Aortic arch: There is common origin of the left common carotid artery in the innominate artery. No significant atherosclerotic changes are present at the aortic arch. There is no significant stenosis. Right carotid system: The right common carotid artery is within normal limits. Bifurcation is unremarkable. The cervical right ICA is normal. Left carotid system: The left common carotid artery is within normal limits. The bifurcation is unremarkable. The cervical left ICA is normal. Vertebral arteries: The vertebral arteries are codominant. Both vertebral arteries originate from the subclavian arteries without significant stenosis. There is no significant stenosis of either vertebral artery in the neck. Skeleton: Vertebral body heights and alignment are maintained mild endplate degenerative changes present at C5-6. No focal  lytic or blastic lesions are present. Other neck: A calcified left thyroid nodule measures up to 14 mm. The left lobe of the thyroid is asymmetric. No significant cervical adenopathy is present. Salivary glands are within normal limits bilaterally. Upper chest: Mild dependent atelectasis is present in the lung apices. No focal nodule, mass, or airspace disease is present. The thoracic inlet is within normal limits. Review of the MIP images confirms the above findings CTA HEAD FINDINGS Anterior circulation: The internal carotid arteries are within normal limits through the ICA termini bilaterally. The A1 and M1 segments are normal. The anterior communicating artery is patent. MCA bifurcations are within normal limits. ACA and MCA branch vessels are within normal limits. Posterior circulation: The PICA origins are visualized and normal. Vertebrobasilar junction is normal. The basilar artery is within normal limits. Both posterior cerebral arteries originate from the basilar tip. PCA branch vessels are within normal limits. Venous sinuses: The dural sinuses are patent. The straight sinus deep cerebral veins are intact. Cortical veins are unremarkable. Anatomic variants: None Delayed phase: The postcontrast images demonstrate no pathologic enhancement. Review of the MIP images confirms the above findings IMPRESSION: 1. Negative CTA of the neck. No significant atherosclerotic change or stenosis. 2. Mild endplate degenerative changes of the cervical spine are most pronounced at C5-6. 3. Calcified left thyroid nodule. Multinodular goiter has been worked up previously. No follow-up is necessary. 4. Normal variant CTA circle of Willis without significant proximal stenosis, aneurysm, or branch vessel occlusion. Electronically Signed   By: San Morelle M.D.   On: 09/10/2017 19:23   Dg Chest 2 View  Result Date: 09/10/2017 CLINICAL DATA:  Arm and neck region pain EXAM: CHEST - 2 VIEW COMPARISON:  March 03, 2014  FINDINGS: There is no appreciable edema or consolidation. Heart size and pulmonary vascularity are normal. No. No pneumothorax. No bone lesions. IMPRESSION: No edema or consolidation. Electronically Signed   By: Lowella Grip III M.D.   On: 09/10/2017 13:25   Ct Head Wo Contrast  Result Date: 09/10/2017 CLINICAL DATA:  Acute severe headache. EXAM: CT HEAD WITHOUT CONTRAST TECHNIQUE: Contiguous axial images were obtained from the base of the skull through the vertex without intravenous contrast. COMPARISON:  None. FINDINGS: Brain: No evidence of acute infarction, hemorrhage, hydrocephalus, extra-axial collection, or mass lesion/mass effect. Vascular:  No hyperdense vessel or other acute findings. Skull: No evidence of fracture or other significant bone abnormality. Sinuses/Orbits:  No acute findings. Other: None. IMPRESSION: Negative noncontrast head CT. Electronically Signed   By: Jenny Reichmann  Kris Hartmann M.D.   On: 09/10/2017 17:20   Ct Angio Neck W And/or Wo Contrast  Result Date: 09/10/2017 CLINICAL DATA:  Left neck swelling.  Left neck pain. EXAM: CT ANGIOGRAPHY HEAD AND NECK TECHNIQUE: Multidetector CT imaging of the head and neck was performed using the standard protocol during bolus administration of intravenous contrast. Multiplanar CT image reconstructions and MIPs were obtained to evaluate the vascular anatomy. Carotid stenosis measurements (when applicable) are obtained utilizing NASCET criteria, using the distal internal carotid diameter as the denominator. CONTRAST:  53mL ISOVUE-370 IOPAMIDOL (ISOVUE-370) INJECTION 76% COMPARISON:  CT of the head doubt contrast same day. CT cervical spine without contrast the same day. FINDINGS: CTA NECK FINDINGS Aortic arch: There is common origin of the left common carotid artery in the innominate artery. No significant atherosclerotic changes are present at the aortic arch. There is no significant stenosis. Right carotid system: The right common carotid artery is within  normal limits. Bifurcation is unremarkable. The cervical right ICA is normal. Left carotid system: The left common carotid artery is within normal limits. The bifurcation is unremarkable. The cervical left ICA is normal. Vertebral arteries: The vertebral arteries are codominant. Both vertebral arteries originate from the subclavian arteries without significant stenosis. There is no significant stenosis of either vertebral artery in the neck. Skeleton: Vertebral body heights and alignment are maintained mild endplate degenerative changes present at C5-6. No focal lytic or blastic lesions are present. Other neck: A calcified left thyroid nodule measures up to 14 mm. The left lobe of the thyroid is asymmetric. No significant cervical adenopathy is present. Salivary glands are within normal limits bilaterally. Upper chest: Mild dependent atelectasis is present in the lung apices. No focal nodule, mass, or airspace disease is present. The thoracic inlet is within normal limits. Review of the MIP images confirms the above findings CTA HEAD FINDINGS Anterior circulation: The internal carotid arteries are within normal limits through the ICA termini bilaterally. The A1 and M1 segments are normal. The anterior communicating artery is patent. MCA bifurcations are within normal limits. ACA and MCA branch vessels are within normal limits. Posterior circulation: The PICA origins are visualized and normal. Vertebrobasilar junction is normal. The basilar artery is within normal limits. Both posterior cerebral arteries originate from the basilar tip. PCA branch vessels are within normal limits. Venous sinuses: The dural sinuses are patent. The straight sinus deep cerebral veins are intact. Cortical veins are unremarkable. Anatomic variants: None Delayed phase: The postcontrast images demonstrate no pathologic enhancement. Review of the MIP images confirms the above findings IMPRESSION: 1. Negative CTA of the neck. No significant  atherosclerotic change or stenosis. 2. Mild endplate degenerative changes of the cervical spine are most pronounced at C5-6. 3. Calcified left thyroid nodule. Multinodular goiter has been worked up previously. No follow-up is necessary. 4. Normal variant CTA circle of Willis without significant proximal stenosis, aneurysm, or branch vessel occlusion. Electronically Signed   By: San Morelle M.D.   On: 09/10/2017 19:23   Ct C-spine No Charge  Result Date: 09/10/2017 CLINICAL DATA:  Neck and left arm pain. EXAM: CT CERVICAL SPINE WITHOUT CONTRAST TECHNIQUE: Multidetector CT imaging of the cervical spine was performed without intravenous contrast. Multiplanar CT image reconstructions were also generated. COMPARISON:  None. FINDINGS: Alignment: Straightening of the normal cervical lordosis. Skull base and vertebrae: No acute fracture. No primary bone lesion or focal pathologic process. Soft tissues and spinal canal: Negative. Disc levels: Mild disc height loss, endplate spurring, and uncovertebral hypertrophy at  C5-C6 and C6-C7. No significant spinal canal or neuroforaminal stenosis. Upper chest: Negative. Other: 1.4 cm calcified left thyroid nodule. IMPRESSION: 1.  No acute osseous abnormality. 2. Mild degenerative changes at C5-C6 and C6-C7. Electronically Signed   By: Titus Dubin M.D.   On: 09/10/2017 19:58    Procedures Procedures (including critical care time)  Medications Ordered in ED Medications  iopamidol (ISOVUE-370) 76 % injection (has no administration in time range)  sodium chloride 0.9 % bolus 500 mL (500 mLs Intravenous New Bag/Given 09/10/17 2031)  sodium chloride 0.9 % bolus 500 mL (0 mLs Intravenous Stopped 09/10/17 2005)  morphine 4 MG/ML injection 4 mg (4 mg Intravenous Given 09/10/17 1512)  morphine 4 MG/ML injection 4 mg (4 mg Intravenous Given 09/10/17 1752)  promethazine (PHENERGAN) injection 12.5 mg (12.5 mg Intravenous Given 09/10/17 1749)  iopamidol (ISOVUE-370) 76 %  injection 50 mL (50 mLs Intravenous Contrast Given 09/10/17 1851)     Initial Impression / Assessment and Plan / ED Course  I have reviewed the triage vital signs and the nursing notes.  Pertinent labs & imaging results that were available during my care of the patient were reviewed by me and considered in my medical decision making (see chart for details).  Clinical Course as of Sep 10 2099  Sat Sep 10, 2017  2052 Spoke with Dr. Roel Cluck who will see patient.    [EH]    Clinical Course User Index [EH] Lorin Glass, PA-C   Patient presents today for evaluation of left-sided chest pressure with associated left arm and left-sided jaw pain.  EKG obtained and reviewed without acute abnormalities.  Troponins x2-.  Chest x-ray without consolidations or acute abnormalities.  Her pain was treated with morphine x2, and Phenergan without significant improvement.  She had associated headache which improved after IV fluids and morphine.  D-dimer is not elevated.  TSH ordered.  CK is not elevated.  CTA head/neck were obtained based on constellation of symptoms which did not show any acute abnormalities.  Based on the persistent nature of her pain and concerning history with new bradycardia recommend admission for ACS rule out.  I spoke with Dr.Doutova who agreed to come evaluate the patient.  Heart Score 4.    Final Clinical Impressions(s) / ED Diagnoses   Final diagnoses:  Chest pain, unspecified type    ED Discharge Orders    None       Ollen Gross 09/10/17 2104    Drenda Freeze, MD 09/10/17 2158

## 2017-09-10 NOTE — ED Notes (Signed)
ED Provider at bedside. 

## 2017-09-10 NOTE — ED Notes (Signed)
Visual acuity test results: Right eye 20/16 left eye 10/16

## 2017-09-10 NOTE — ED Notes (Signed)
Patient transported to CT 

## 2017-09-10 NOTE — H&P (Signed)
Kimberly Hanson QBH:[id number] DOB: 1965-06-03 DOA: 09/10/2017     PCP: Elwyn Reach, MD   Outpatient Specialists:     Rocco Pauls Patient arrived to ER on 09/10/17 at 1230  Patient coming from:   home Lives  With family   Chief Complaint:  Chief Complaint  Patient presents with  . Chest Pain    HPI: Kimberly Hanson is a 52 y.o. female with medical history significant of Hypothyroidism  , history of seizure disorder   Presented with jaw pain and   left arm without numbness or tingling sensation.  Associated with left shoulder pain. Woke her up at night she was sweaty, dizzy, nauseous. She waited for an hour, but not get better. She smokes half a pack a day , no prior hx of the same.   Reports fatigue, feels dizzy at times sometimes looses balance if stands up too quickly for the past few months.   reports associated chest pressure  no nausea vomiting no lightheadedness no abdominal pain no history of CAD  Reports extra stress recently. Has been eating drinking well.  Has been taking her thyroid medications. Some times has no appetite. She has not eaten much today,.   Reported mild headache associated.   Reports she have had occasional left leg swelling but now better.  She had surgery on her left knee. Regarding pertinent Chronic problems: history of nodular goiter and hyperthyroidism diagnosed in 2012 status post RAI rx  developing hypothyroidism thereafter While in ER: Bradycardic in ER CT angio head and neck unremarkable   Following Medications were ordered in ER: Medications  iopamidol (ISOVUE-370) 76 % injection (has no administration in time range)  sodium chloride 0.9 % bolus 500 mL (500 mLs Intravenous New Bag/Given 09/10/17 2031)  sodium chloride 0.9 % bolus 500 mL (0 mLs Intravenous Stopped 09/10/17 2005)  morphine 4 MG/ML injection 4 mg (4 mg Intravenous Given 09/10/17 1512)  morphine 4 MG/ML injection 4 mg (4 mg Intravenous Given 09/10/17 1752)  promethazine  (PHENERGAN) injection 12.5 mg (12.5 mg Intravenous Given 09/10/17 1749)  iopamidol (ISOVUE-370) 76 % injection 50 mL (50 mLs Intravenous Contrast Given 09/10/17 1851)    Significant initial  Findings: Abnormal Labs Reviewed  BASIC METABOLIC PANEL - Abnormal; Notable for the following components:      Result Value   Glucose, Bld 112 (*)    Calcium 8.8 (*)    All other components within normal limits  CBC - Abnormal; Notable for the following components:   RBC 3.80 (*)    Hemoglobin 11.0 (*)    HCT 35.1 (*)    All other components within normal limits     Na 140 K 3.9  Cr   stable,   Lab Results  Component Value Date   CREATININE 0.74 09/10/2017   CREATININE 0.76 03/03/2014   CREATININE 0.81 06/26/2013      WBC  6.0  HG/HCT stable,       Component Value Date/Time   HGB 11.0 (L) 09/10/2017 1244   HCT 35.1 (L) 09/10/2017 1244    Troponin (Point of Care Test) Recent Labs    09/10/17 1819  TROPIPOC 0.00      UA not ordered   CT HEAD  NON acute  CXR - NON acute     ECG:  Personally reviewed by me showing: HR : 41 Rhythm: bradycardia   no evidence of ischemic changes QTC 371    ED Triage Vitals  Enc Vitals Group  BP 09/10/17 1240 (!) 128/92     Pulse Rate 09/10/17 1240 66     Resp 09/10/17 1240 16     Temp 09/10/17 1240 99 F (37.2 C)     Temp Source 09/10/17 1240 Oral     SpO2 09/10/17 1240 98 %     Weight --      Height --      Head Circumference --      Peak Flow --      Pain Score 09/10/17 1237 9     Pain Loc --      Pain Edu? --      Excl. in [place]? --   TMAX(24)@       Latest  Blood pressure 104/65, pulse (!) 45, temperature 99 F (37.2 C), temperature source Oral, resp. rate 13, SpO2 99 %.   Hospitalist was called for admission for chest pressure and associated bradycardia   Review of Systems:    Pertinent positives include: left jaw pain radiating to left arm, chest pressure diaphoresis  Constitutional:  No weight loss, night  sweats, Fevers, chills, fatigue, weight loss  HEENT:  No headaches, Difficulty swallowing,Tooth/dental problems,Sore throat,  No sneezing, itching, ear ache, nasal congestion, post nasal drip,  Cardio-vascular:  No chest pain, Orthopnea, PND, anasarca, dizziness, palpitations.no Bilateral lower extremity swelling  GI:  No heartburn, indigestion, abdominal pain, nausea, vomiting, diarrhea, change in bowel habits, loss of appetite, melena, blood in stool, hematemesis Resp:  no shortness of breath at rest. No dyspnea on exertion, No excess mucus, no productive cough, No non-productive cough, No coughing up of blood.No change in color of mucus.No wheezing. Skin:  no rash or lesions. No jaundice GU:  no dysuria, change in color of urine, no urgency or frequency. No straining to urinate.  No flank pain.  Musculoskeletal:  No joint pain or no joint swelling. No decreased range of motion. No back pain.  Psych:  No change in mood or affect. No depression or anxiety. No memory loss.  Neuro: no localizing neurological complaints, no tingling, no weakness, no double vision, no gait abnormality, no slurred speech, no confusion  As per HPI otherwise 10 point review of systems negative.   Past Medical History:   Past Medical History:  Diagnosis Date  . Cholelithiasis    On CT 06/26/13  . Diverticulosis    On CT 06/26/13  . Hyperthyroidism   . Multinodular goiter   . Seizures ([place])       Past Surgical History:  Procedure Laterality Date  . ABDOMINAL HYSTERECTOMY  2002  . CESAREAN SECTION     x 2  . COLONOSCOPY N/A 07/04/2013   Procedure: COLONOSCOPY;  Surgeon: Milus Banister, MD;  Location: [facility];  Service: Endoscopy;  Laterality: N/A;    Social History:  Ambulatory   Independently     reports that she has been smoking cigarettes.  She has a 2.50 pack-year smoking history. She has never used smokeless tobacco. She reports that she drinks alcohol. She reports that she does not  use drugs.     Family History:   Family History  Problem Relation Age of Onset  . Hypertension Mother   . Colon polyps Mother   . Heart attack Mother   . Colon cancer Neg Hx     Allergies: Allergies  Allergen Reactions  . Naproxen Anaphylaxis     Prior to Admission medications   Medication Sig Start Date End Date Taking? Authorizing Provider  acetaminophen (TYLENOL)  500 MG tablet Take 1,000 mg by mouth every 6 (six) hours as needed for mild pain.    [provider]  albuterol (PROVENTIL HFA;VENTOLIN HFA) 108 (90 BASE) MCG/ACT inhaler Inhale 1-2 puffs into the lungs every 6 (six) hours as needed for wheezing or shortness of breath. 03/03/14   Forcucci, Courtney, PA-C  levothyroxine (SYNTHROID, LEVOTHROID) 200 MCG tablet Take 1 tablet (200 mcg total) by mouth daily before breakfast. 07/04/17   Renato Shin, MD  meloxicam (MOBIC) 15 MG tablet Take 15 mg by mouth daily.    [provider]  zolpidem (AMBIEN) 5 MG tablet  02/16/16   [provider]   Physical Exam: Blood pressure 104/65, pulse (!) 45, temperature 99 F (37.2 C), temperature source Oral, resp. rate 13, SpO2 99 %. 1. General:  in No Acute distress  well  -appearing 2. Psychological: Alert and   Oriented 3. Head/ENT:     Dry Mucous Membranes                          Head Non traumatic, neck supple                            Poor Dentition 4. SKIN:   decreased Skin turgor,  Skin clean Dry and intact no rash 5. Heart: Regular rate and rhythm no  Murmur, no Rub or gallop 6. Lungs: , no wheezes or crackles   7. Abdomen: Soft,  non-tender, Non distended   obese  bowel sounds present 8. Lower extremities: no clubbing, cyanosis, or edema 9. Neurologically Grossly intact, moving all 4 extremities equally   10. MSK: Normal range of motion   LABS:     Recent Labs  Lab 09/10/17 1244  WBC 6.0  HGB 11.0*  HCT 35.1*  MCV 92.4  PLT 176   Basic Metabolic Panel: Recent Labs  Lab  09/10/17 1244  NA 140  K 3.9  CL 111  CO2 23  GLUCOSE 112*  BUN 8  CREATININE 0.74  CALCIUM 8.8*      No results for input(s): AST, ALT, ALKPHOS, BILITOT, PROT, ALBUMIN in the last 168 hours. No results for input(s): LIPASE, AMYLASE in the last 168 hours. No results for input(s): AMMONIA in the last 168 hours.    HbA1C: No results for input(s): HGBA1C in the last 72 hours. CBG: No results for input(s): GLUCAP in the last 168 hours.    Urine analysis:    Component Value Date/Time   COLORURINE YELLOW 06/26/2013 1014   APPEARANCEUR CLEAR 06/26/2013 1014   LABSPEC 1.001 (L) 06/26/2013 1014   PHURINE 7.0 06/26/2013 1014   GLUCOSEU NEGATIVE 06/26/2013 1014   HGBUR NEGATIVE 06/26/2013 1014   BILIRUBINUR NEGATIVE 06/26/2013 1014   BILIRUBINUR Trace 01/23/2013 1148   KETONESUR NEGATIVE 06/26/2013 1014   PROTEINUR NEGATIVE 06/26/2013 1014   UROBILINOGEN 0.2 06/26/2013 1014   NITRITE NEGATIVE 06/26/2013 1014   LEUKOCYTESUR NEGATIVE 06/26/2013 1014       Cultures: No results found for: SDES, SPECREQUEST, CULT, REPTSTATUS   Radiological Exams on Admission: Ct Angio Head W Or Wo Contrast  Result Date: 09/10/2017 CLINICAL DATA:  Left neck swelling.  Left neck pain. EXAM: CT ANGIOGRAPHY HEAD AND NECK TECHNIQUE: Multidetector CT imaging of the head and neck was performed using the standard protocol during bolus administration of intravenous contrast. Multiplanar CT image reconstructions and MIPs were obtained to evaluate the vascular anatomy.  Carotid stenosis measurements (when applicable) are obtained utilizing NASCET criteria, using the distal internal carotid diameter as the denominator. CONTRAST:  68mL ISOVUE-370 IOPAMIDOL (ISOVUE-370) INJECTION 76% COMPARISON:  CT of the head doubt contrast same day. CT cervical spine without contrast the same day. FINDINGS: CTA NECK FINDINGS Aortic arch: There is common origin of the left common carotid artery in the innominate artery. No  significant atherosclerotic changes are present at the aortic arch. There is no significant stenosis. Right carotid system: The right common carotid artery is within normal limits. Bifurcation is unremarkable. The cervical right ICA is normal. Left carotid system: The left common carotid artery is within normal limits. The bifurcation is unremarkable. The cervical left ICA is normal. Vertebral arteries: The vertebral arteries are codominant. Both vertebral arteries originate from the subclavian arteries without significant stenosis. There is no significant stenosis of either vertebral artery in the neck. Skeleton: Vertebral body heights and alignment are maintained mild endplate degenerative changes present at C5-6. No focal lytic or blastic lesions are present. Other neck: A calcified left thyroid nodule measures up to 14 mm. The left lobe of the thyroid is asymmetric. No significant cervical adenopathy is present. Salivary glands are within normal limits bilaterally. Upper chest: Mild dependent atelectasis is present in the lung apices. No focal nodule, mass, or airspace disease is present. The thoracic inlet is within normal limits. Review of the MIP images confirms the above findings CTA HEAD FINDINGS Anterior circulation: The internal carotid arteries are within normal limits through the ICA termini bilaterally. The A1 and M1 segments are normal. The anterior communicating artery is patent. MCA bifurcations are within normal limits. ACA and MCA branch vessels are within normal limits. Posterior circulation: The PICA origins are visualized and normal. Vertebrobasilar junction is normal. The basilar artery is within normal limits. Both posterior cerebral arteries originate from the basilar tip. PCA branch vessels are within normal limits. Venous sinuses: The dural sinuses are patent. The straight sinus deep cerebral veins are intact. Cortical veins are unremarkable. Anatomic variants: None Delayed phase: The  postcontrast images demonstrate no pathologic enhancement. Review of the MIP images confirms the above findings IMPRESSION: 1. Negative CTA of the neck. No significant atherosclerotic change or stenosis. 2. Mild endplate degenerative changes of the cervical spine are most pronounced at C5-6. 3. Calcified left thyroid nodule. Multinodular goiter has been worked up previously. No follow-up is necessary. 4. Normal variant CTA circle of Willis without significant proximal stenosis, aneurysm, or branch vessel occlusion. Electronically Signed   By: San Morelle M.D.   On: 09/10/2017 19:23   Dg Chest 2 View  Result Date: 09/10/2017 CLINICAL DATA:  Arm and neck region pain EXAM: CHEST - 2 VIEW COMPARISON:  March 03, 2014 FINDINGS: There is no appreciable edema or consolidation. Heart size and pulmonary vascularity are normal. No. No pneumothorax. No bone lesions. IMPRESSION: No edema or consolidation. Electronically Signed   By: Lowella Grip III M.D.   On: 09/10/2017 13:25   Ct Head Wo Contrast  Result Date: 09/10/2017 CLINICAL DATA:  Acute severe headache. EXAM: CT HEAD WITHOUT CONTRAST TECHNIQUE: Contiguous axial images were obtained from the base of the skull through the vertex without intravenous contrast. COMPARISON:  None. FINDINGS: Brain: No evidence of acute infarction, hemorrhage, hydrocephalus, extra-axial collection, or mass lesion/mass effect. Vascular:  No hyperdense vessel or other acute findings. Skull: No evidence of fracture or other significant bone abnormality. Sinuses/Orbits:  No acute findings. Other: None. IMPRESSION: Negative noncontrast head CT. Electronically  Signed   By: Earle Gell M.D.   On: 09/10/2017 17:20   Ct Angio Neck W And/or Wo Contrast  Result Date: 09/10/2017 CLINICAL DATA:  Left neck swelling.  Left neck pain. EXAM: CT ANGIOGRAPHY HEAD AND NECK TECHNIQUE: Multidetector CT imaging of the head and neck was performed using the standard protocol during bolus  administration of intravenous contrast. Multiplanar CT image reconstructions and MIPs were obtained to evaluate the vascular anatomy. Carotid stenosis measurements (when applicable) are obtained utilizing NASCET criteria, using the distal internal carotid diameter as the denominator. CONTRAST:  58mL ISOVUE-370 IOPAMIDOL (ISOVUE-370) INJECTION 76% COMPARISON:  CT of the head doubt contrast same day. CT cervical spine without contrast the same day. FINDINGS: CTA NECK FINDINGS Aortic arch: There is common origin of the left common carotid artery in the innominate artery. No significant atherosclerotic changes are present at the aortic arch. There is no significant stenosis. Right carotid system: The right common carotid artery is within normal limits. Bifurcation is unremarkable. The cervical right ICA is normal. Left carotid system: The left common carotid artery is within normal limits. The bifurcation is unremarkable. The cervical left ICA is normal. Vertebral arteries: The vertebral arteries are codominant. Both vertebral arteries originate from the subclavian arteries without significant stenosis. There is no significant stenosis of either vertebral artery in the neck. Skeleton: Vertebral body heights and alignment are maintained mild endplate degenerative changes present at C5-6. No focal lytic or blastic lesions are present. Other neck: A calcified left thyroid nodule measures up to 14 mm. The left lobe of the thyroid is asymmetric. No significant cervical adenopathy is present. Salivary glands are within normal limits bilaterally. Upper chest: Mild dependent atelectasis is present in the lung apices. No focal nodule, mass, or airspace disease is present. The thoracic inlet is within normal limits. Review of the MIP images confirms the above findings CTA HEAD FINDINGS Anterior circulation: The internal carotid arteries are within normal limits through the ICA termini bilaterally. The A1 and M1 segments are  normal. The anterior communicating artery is patent. MCA bifurcations are within normal limits. ACA and MCA branch vessels are within normal limits. Posterior circulation: The PICA origins are visualized and normal. Vertebrobasilar junction is normal. The basilar artery is within normal limits. Both posterior cerebral arteries originate from the basilar tip. PCA branch vessels are within normal limits. Venous sinuses: The dural sinuses are patent. The straight sinus deep cerebral veins are intact. Cortical veins are unremarkable. Anatomic variants: None Delayed phase: The postcontrast images demonstrate no pathologic enhancement. Review of the MIP images confirms the above findings IMPRESSION: 1. Negative CTA of the neck. No significant atherosclerotic change or stenosis. 2. Mild endplate degenerative changes of the cervical spine are most pronounced at C5-6. 3. Calcified left thyroid nodule. Multinodular goiter has been worked up previously. No follow-up is necessary. 4. Normal variant CTA circle of Willis without significant proximal stenosis, aneurysm, or branch vessel occlusion. Electronically Signed   By: San Morelle M.D.   On: 09/10/2017 19:23   Ct C-spine No Charge  Result Date: 09/10/2017 CLINICAL DATA:  Neck and left arm pain. EXAM: CT CERVICAL SPINE WITHOUT CONTRAST TECHNIQUE: Multidetector CT imaging of the cervical spine was performed without intravenous contrast. Multiplanar CT image reconstructions were also generated. COMPARISON:  None. FINDINGS: Alignment: Straightening of the normal cervical lordosis. Skull base and vertebrae: No acute fracture. No primary bone lesion or focal pathologic process. Soft tissues and spinal canal: Negative. Disc levels: Mild disc height loss,  endplate spurring, and uncovertebral hypertrophy at C5-C6 and C6-C7. No significant spinal canal or neuroforaminal stenosis. Upper chest: Negative. Other: 1.4 cm calcified left thyroid nodule. IMPRESSION: 1.  No acute  osseous abnormality. 2. Mild degenerative changes at C5-C6 and C6-C7. Electronically Signed   By: Titus Dubin M.D.   On: 09/10/2017 19:58    Chart has been reviewed    Assessment/Plan  51 y.o. female with medical history significant of Hypothyroidism  , history of seizure disorder  Admitted for chest pressure  Present on Admission: . Bradycardia - check TSH, echo gram, apprecite cardiology consult in AM . Hypothyroidism following radioiodine therapy - check TSH, T4 T3  . Chest pressure - - H=   2 ,E=  1  ,A= 1  , R 0  , T 0  ,  for the  Total of 4  therefore will admit for observation and further evaluation ( Risk of MACE: Scores 0-3  of 0.9-1.7%.,  4-6: 12-16.6% , Scores ?7: 50-65% )   -  Other explanation for chest pain could be  musculoskeletal   - monitor on telemetry, cycle cardiac enzymes, obtain serial ECG and  ECHO in AM.   - Daily aspirin -  Further risk stratify with lipid panel, hgA1C, obtain TSH.  Make sure patient is on Aspirin.  We will notify cardiology regarding patient's admission. Further management depends on pending  workup    Other plan as per orders.  DVT prophylaxis:  Lovenox     Code Status:  FULL CODE as per patient   I had personally discussed CODE STATUS with patient   Family Communication:   Family not  at  Bedside    Disposition Plan:     To home once workup is complete and patient is stable                                                Consults called:  cardiology   Aware will see in AM  Admission status:    obs   Level of care  tele          Toy Baker 09/10/2017, 9:32 PM    [facility]  Pager [phone]   after 2 AM please page floor coverage PA If 7AM-7PM, please contact the day team taking care of the patient  [email or web address]  Password [email or web address]

## 2017-09-10 NOTE — ED Provider Notes (Signed)
[facility]    CSN: [id number] Arrival date & time: 09/10/17  1210     History   Chief Complaint Chief Complaint  Patient presents with  . Arm Pain  . Jaw Pain    HPI Kimberly Hanson is a 52 y.o. female.   This is a 52 year old female, with history of hypothyroidism, comes in today for left jaw pain radiating down to the left shoulder into the left arm without numbness or tingling sensation.  Pain woke her up this morning.  Pain is sudden. Pain describes as achiness.  Denies any injuries or recent extraneous activities. She endorses nausea, headache, dizziness.  Patient denies chest pain or shortness of breath. She also denies abdominal pain. Denies hx of cardiac problem.      Past Medical History:  Diagnosis Date  . Cholelithiasis    On CT 06/26/13  . Diverticulosis    On CT 06/26/13  . Hyperthyroidism   . Multinodular goiter   . Seizures [facility])     Patient Active Problem List   Diagnosis Date Noted  . Lumbar radiculopathy 03/22/2014  . Greater trochanteric bursitis of left hip 03/22/2014  . Benign neoplasm of colon 07/04/2013  . Diverticulosis of colon (without mention of hemorrhage) 07/04/2013  . External hemorrhoids without mention of complication 24/40/1027  . Hypothyroidism following radioiodine therapy 06/08/2013  . Unspecified constipation 09/19/2012  . Sciatica neuralgia 09/06/2012  . Multinodular goiter 07/13/2012    Past Surgical History:  Procedure Laterality Date  . ABDOMINAL HYSTERECTOMY  2002  . CESAREAN SECTION     x 2  . COLONOSCOPY N/A 07/04/2013   Procedure: COLONOSCOPY;  Surgeon: Milus Banister, MD;  Location: [facility] ENDOSCOPY;  Service: Endoscopy;  Laterality: N/A;    OB History   None      Home Medications    Prior to Admission medications   Medication Sig Start Date End Date Taking? Authorizing Provider  levothyroxine (SYNTHROID, LEVOTHROID) 200 MCG tablet Take 1 tablet (200 mcg total) by mouth daily before breakfast. 07/04/17   Yes Renato Shin, MD  acetaminophen (TYLENOL) 500 MG tablet Take 1,000 mg by mouth every 6 (six) hours as needed for mild pain.    [provider]  albuterol (PROVENTIL HFA;VENTOLIN HFA) 108 (90 BASE) MCG/ACT inhaler Inhale 1-2 puffs into the lungs every 6 (six) hours as needed for wheezing or shortness of breath. 03/03/14   Forcucci, Courtney, PA-C  meloxicam (MOBIC) 15 MG tablet Take 15 mg by mouth daily.    [provider]  zolpidem (AMBIEN) 5 MG tablet  02/16/16   [provider]    Family History Family History  Problem Relation Age of Onset  . Hypertension Mother   . Colon polyps Mother   . Heart attack Mother   . Colon cancer Neg Hx     Social History Social History   Tobacco Use  . Smoking status: Current Every Day Smoker    Packs/day: 0.25    Years: 5.00    Pack years: 1.25    Types: Cigarettes  . Smokeless tobacco: Never Used  . Tobacco comment: tobacco info given 06/29/13  Substance Use Topics  . Alcohol use: Yes    Comment: occasional  . Drug use: No     Allergies   Naproxen   Review of Systems Review of Systems  Constitutional:       See HPI     Physical Exam Triage Vital Signs ED Triage Vitals [09/10/17 1221]  Enc Vitals Group  BP 137/83     Pulse Rate (!) 59     Resp 18     Temp      Temp src      SpO2 96 %     Weight      Height      Head Circumference      Peak Flow      Pain Score      Pain Loc      Pain Edu?      Excl. in [place]?    No data found.  Updated Vital Signs BP 137/83   Pulse (!) 59   Resp 18   SpO2 96%   Visual Acuity Right Eye Distance:   Left Eye Distance:   Bilateral Distance:    Right Eye Near:   Left Eye Near:    Bilateral Near:     Physical Exam  Constitutional: She is oriented to person, place, and time. She appears well-developed and well-nourished.  HENT:  Head: Normocephalic and atraumatic.  Cardiovascular: Normal rate, regular rhythm and normal heart sounds.    Pulmonary/Chest: Effort normal and breath sounds normal. She has no wheezes.  Abdominal: Soft. Bowel sounds are normal. There is no tenderness.  Musculoskeletal:  Left jaw sore to palpate. Left arm has full ROM. Sensation intact.   Neurological: She is alert and oriented to person, place, and time.  Skin: Skin is warm and dry.  Nursing note and vitals reviewed.    UC Treatments / Results  Labs (all labs ordered are listed, but only abnormal results are displayed) Labs Reviewed - No data to display  EKG None  Radiology No results found.  Procedures Procedures (including critical care time)  Medications Ordered in UC Medications - No data to display  Initial Impression / Assessment and Plan / UC Course  I have reviewed the triage vital signs and the nursing notes.  Pertinent labs & imaging results that were available during my care of the patient were reviewed by me and considered in my medical decision making (see chart for details).  Final Clinical Impressions(s) / UC Diagnoses   Final diagnoses:  Nausea without vomiting   This is a 52 year old female, comes in today for left arm pain and left jaw pain accompanied by nausea, headache and dizziness.  Vital signs is appropriate.  EKG shows sinus bradycardia.  Patient sent to the emergency department for further evaluation to rule out cardiac. Patient is stable to transfer.   Discharge Instructions   None    ED Prescriptions    None     Controlled Substance Prescriptions [place] Controlled Substance Registry consulted? Not Applicable   Barry Dienes, NP 09/10/17 1230

## 2017-09-10 NOTE — ED Notes (Signed)
Assessment per Juline Patch.  Instructed to go to ED.  Pt & friend verbalized understanding.

## 2017-09-10 NOTE — ED Triage Notes (Signed)
Pt prwsents to ED for assessment of left arm pain radiating to jaw, denies chest pain, pain worse with movement and palpation.  Pt states pain woke her up from sleep.  Patient states when she woke up she was clammy, hot, short of breath, and nauseas.  Patient states constant since.

## 2017-09-10 NOTE — ED Triage Notes (Signed)
Reports waking this AM with significant LUE pain radiating into left jaw with nausea, some SOB; initially "broke out in sweat".  Denies any chest pain.  Pain non-reproduceable.

## 2017-09-11 ENCOUNTER — Observation Stay (HOSPITAL_COMMUNITY): Payer: BLUE CROSS/BLUE SHIELD

## 2017-09-11 ENCOUNTER — Other Ambulatory Visit: Payer: Self-pay

## 2017-09-11 ENCOUNTER — Encounter (HOSPITAL_COMMUNITY): Payer: Self-pay | Admitting: Radiology

## 2017-09-11 DIAGNOSIS — R0789 Other chest pain: Secondary | ICD-10-CM | POA: Diagnosis not present

## 2017-09-11 DIAGNOSIS — E890 Postprocedural hypothyroidism: Secondary | ICD-10-CM | POA: Diagnosis not present

## 2017-09-11 DIAGNOSIS — R079 Chest pain, unspecified: Secondary | ICD-10-CM

## 2017-09-11 DIAGNOSIS — R001 Bradycardia, unspecified: Secondary | ICD-10-CM | POA: Diagnosis not present

## 2017-09-11 LAB — TROPONIN I

## 2017-09-11 LAB — LIPID PANEL
CHOL/HDL RATIO: 5.9 ratio
Cholesterol: 172 mg/dL (ref 0–200)
HDL: 29 mg/dL — AB (ref >40)
LDL CALC: 107 mg/dL — AB (ref 0–99)
TRIGLYCERIDES: 180 mg/dL — AB (ref <150)
VLDL: 36 mg/dL (ref 0–40)

## 2017-09-11 LAB — MRSA PCR SCREENING: MRSA by PCR: NEGATIVE

## 2017-09-11 LAB — HIV ANTIBODY (ROUTINE TESTING W REFLEX): HIV Screen 4th Generation wRfx: NONREACTIVE

## 2017-09-11 MED ORDER — MORPHINE SULFATE (PF) 2 MG/ML IV SOLN
2.0000 mg | Freq: Once | INTRAVENOUS | Status: AC
  Administered 2017-09-11: 2 mg via INTRAVENOUS
  Filled 2017-09-11: qty 1

## 2017-09-11 MED ORDER — ONDANSETRON HCL 4 MG/2ML IJ SOLN: 4.0000 mg | Freq: Four times a day (QID) | INTRAMUSCULAR | Status: DC | PRN

## 2017-09-11 MED ORDER — ENOXAPARIN SODIUM 40 MG/0.4ML ~~LOC~~ SOLN
40.0000 mg | Freq: Every day | SUBCUTANEOUS | Status: DC
  Administered 2017-09-11 – 2017-09-12 (×2): 40 mg via SUBCUTANEOUS
  Filled 2017-09-11 (×2): qty 0.4

## 2017-09-11 MED ORDER — ACETAMINOPHEN 325 MG PO TABS: 650.0000 mg | ORAL_TABLET | ORAL | Status: DC | PRN

## 2017-09-11 MED ORDER — ATROPINE SULFATE 1 MG/10ML IJ SOSY
PREFILLED_SYRINGE | INTRAMUSCULAR | Status: AC
  Filled 2017-09-11: qty 10

## 2017-09-11 MED ORDER — IOPAMIDOL (ISOVUE-370) INJECTION 76%
INTRAVENOUS | Status: AC
  Administered 2017-09-11: 80 mL
  Filled 2017-09-11: qty 100

## 2017-09-11 MED ORDER — LEVOTHYROXINE SODIUM 100 MCG PO TABS
200.0000 ug | ORAL_TABLET | Freq: Every day | ORAL | Status: DC
  Administered 2017-09-11 – 2017-09-12 (×2): 200 ug via ORAL
  Filled 2017-09-11 (×2): qty 2

## 2017-09-11 MED ORDER — NITROGLYCERIN 0.4 MG SL SUBL
0.8000 mg | SUBLINGUAL_TABLET | Freq: Once | SUBLINGUAL | Status: AC
  Administered 2017-09-11: 0.8 mg via SUBLINGUAL

## 2017-09-11 MED ORDER — NITROGLYCERIN 0.4 MG SL SUBL: 0.4000 mg | SUBLINGUAL_TABLET | SUBLINGUAL | Status: DC | PRN

## 2017-09-11 MED ORDER — NITROGLYCERIN 0.4 MG SL SUBL
SUBLINGUAL_TABLET | SUBLINGUAL | Status: AC
  Filled 2017-09-11: qty 2

## 2017-09-11 MED ORDER — MORPHINE SULFATE (PF) 4 MG/ML IV SOLN
2.0000 mg | INTRAVENOUS | Status: DC | PRN
  Administered 2017-09-11 – 2017-09-12 (×5): 2 mg via INTRAVENOUS
  Filled 2017-09-11 (×5): qty 1

## 2017-09-11 MED ORDER — NITROGLYCERIN 0.4 MG SL SUBL
SUBLINGUAL_TABLET | SUBLINGUAL | Status: AC
  Filled 2017-09-11: qty 1

## 2017-09-11 NOTE — Consult Note (Addendum)
Cardiology Consultation:   Patient ID: Kimberly Hanson; [id number]; Mar 29, 1966   Admit date: 09/10/2017 Date of Consult: 09/11/2017  Primary Care Provider: Elwyn Reach, MD Primary Cardiologist: New  Patient Profile:   Kimberly Hanson is a 52 y.o. female with a hx of tobacco use, family h/o CAD, hypothyroidism treated with hormone replacement and seizure disorder who is being seen today for the evaluation of chest pain and bradycardia at the request of Dr. Roel Cluck, Internal Medicine.  History of Present Illness:   [name] has no prior cardiac history. Her only known risk factors are tobacco use and family history. Smoker x 5 years ~ 0.5ppd.  Her mother had an MI as well as HTN. Her other PMH includes hypothyroidism, on levothyroxine, as well as seizure disorder. Surgical history includes abdominal hysterectomy in 2002.   Pt was in her usual state of health until yesterday morning at approximally 9AM. She was laying in bed and was awoken by sudden onset left sided chest, shoulder and arm pain, with radiation to the left jaw. Felt pressure like. Arm felt heavy. She got out of bed to move around and started to feel worse. The pain did not change but she got diaphoretic and felt nauseated. No vomiting. Pain was 9/10 and constant. She had a friend drive her to the ED. Nothing has really helped. The pain seemed to ease off on its own. However her left arm still aches. She has never felt symptoms like this before.   Troponins are negative x 3. Serial EKGs show sinus bradycardia, with the lowest HR at 41 on EKG. She has had rates in the upper 30s on tele. Pt notes her usual baseline HR is in the 70s-80s.  TSH is WNL. Free T3 and T4 pending. D-dimer negative. CXR negative. Mildly anemic, w/ Hgb at 11. BMP shows mild hypocalcemia at 8.8, otherwise unremarkable.   Past Medical History:  Diagnosis Date  . Cholelithiasis    On CT 06/26/13  . Diverticulosis    On CT 06/26/13  . Hyperthyroidism   .  Multinodular goiter   . Seizures ([place])     Past Surgical History:  Procedure Laterality Date  . ABDOMINAL HYSTERECTOMY  2002  . CESAREAN SECTION     x 2  . COLONOSCOPY N/A 07/04/2013   Procedure: COLONOSCOPY;  Surgeon: Milus Banister, MD;  Location: [facility];  Service: Endoscopy;  Laterality: N/A;     Home Medications:  Prior to Admission medications   Medication Sig Start Date End Date Taking? Authorizing Provider  acetaminophen (TYLENOL) 500 MG tablet Take 1,000 mg by mouth every 6 (six) hours as needed for mild pain.   Yes [provider]  albuterol (PROVENTIL HFA;VENTOLIN HFA) 108 (90 BASE) MCG/ACT inhaler Inhale 1-2 puffs into the lungs every 6 (six) hours as needed for wheezing or shortness of breath. 03/03/14  Yes Forcucci, Courtney, PA-C  levothyroxine (SYNTHROID, LEVOTHROID) 200 MCG tablet Take 1 tablet (200 mcg total) by mouth daily before breakfast. 07/04/17  Yes Renato Shin, MD  meloxicam (MOBIC) 15 MG tablet Take 15 mg by mouth daily.   Yes [provider]  Multiple Vitamin (MULTIVITAMIN WITH MINERALS) TABS tablet Take 1 tablet by mouth daily.   Yes [provider]  oxyCODONE (OXY IR/ROXICODONE) 5 MG immediate release tablet Take 5 mg by mouth every 4 (four) hours as needed for severe pain.   Yes [provider]  zolpidem (AMBIEN) 5 MG tablet Take 5 mg by mouth at bedtime as  needed for sleep.  02/16/16  Yes [provider]    Inpatient Medications: Scheduled Meds: . enoxaparin (LOVENOX) injection  40 mg Subcutaneous Daily  . levothyroxine  200 mcg Oral QAC breakfast  . nitroGLYCERIN       Continuous Infusions:  PRN Meds: acetaminophen, morphine injection, nitroGLYCERIN, ondansetron (ZOFRAN) IV  Allergies:    Allergies  Allergen Reactions  . Naproxen Anaphylaxis    Social History:   Social History   Socioeconomic History  . Marital status: Single    Spouse name: Not on file  . Number of children: 2  . Years  of education: 12+  . Highest education level: Not on file  Occupational History  . Occupation: Forensic psychologist: [employer]  . Financial resource strain: Not on file  . Food insecurity:    Worry: Not on file    Inability: Not on file  . Transportation needs:    Medical: Not on file    Non-medical: Not on file  Tobacco Use  . Smoking status: Current Every Day Smoker    Packs/day: 0.50    Years: 5.00    Pack years: 2.50    Types: Cigarettes  . Smokeless tobacco: Never Used  . Tobacco comment: tobacco info given 06/29/13  Substance and Sexual Activity  . Alcohol use: Yes    Comment: occasional  . Drug use: No  . Sexual activity: Not on file  Lifestyle  . Physical activity:    Days per week: Not on file    Minutes per session: Not on file  . Stress: Not on file  Relationships  . Social connections:    Talks on phone: Not on file    Gets together: Not on file    Attends religious service: Not on file    Active member of club or organization: Not on file    Attends meetings of clubs or organizations: Not on file    Relationship status: Not on file  . Intimate partner violence:    Fear of current or ex partner: Not on file    Emotionally abused: Not on file    Physically abused: Not on file    Forced sexual activity: Not on file  Other Topics Concern  . Not on file  Social History Narrative   Regular exercise-no   Caffeine Use-yes    Family History:    Family History  Problem Relation Age of Onset  . Hypertension Mother   . Colon polyps Mother   . Heart attack Mother   . Colon cancer Neg Hx      ROS:  Please see the history of present illness.   All other ROS reviewed and negative.     Physical Exam/Data:   Vitals:   09/11/17 0147 09/11/17 0200 09/11/17 0246 09/11/17 0259  BP: (!) 112/49 (!) 101/52  106/66  Pulse: (!) 57 (!) 48 (!) 46 (!) 47  Resp: 20 15 18 18   Temp:   98.8 F (37.1 C)   TempSrc:   Oral   SpO2: 100% 99% 100% 100%     Intake/Output Summary (Last 24 hours) at 09/11/2017 0755 Last data filed at 09/10/2017 2151 Gross per 24 hour  Intake 1000 ml  Output -  Net 1000 ml   There were no vitals filed for this visit. There is no height or weight on file to calculate BMI.  General:  Well nourished, well developed, in no acute distress, moderately obese  HEENT: normal Lymph: no adenopathy Neck: no JVD Endocrine:  No thryomegaly Vascular: No carotid bruits; FA pulses 2+ bilaterally without bruits  Cardiac:  Regular rhythm, bradycardia no murmur  Lungs:  clear to auscultation bilaterally, no wheezing, rhonchi or rales  Abd: soft, nontender, no hepatomegaly  Ext: no edema Musculoskeletal:  No deformities, BUE and BLE strength normal and equal Skin: warm and dry  Neuro:  CNs 2-12 intact, no focal abnormalities noted Psych:  Normal affect   EKG:  The EKG was personally reviewed and demonstrates:  Sinus bradycardia, 40s and 40s Telemetry:  Telemetry was personally reviewed and demonstrates:  Sinus bradycardia in the 50s, lowest HR in the 30s  Relevant CV Studies: 2D Echo pending   Laboratory Data:  Chemistry Recent Labs  Lab 09/10/17 1244  NA 140  K 3.9  CL 111  CO2 23  GLUCOSE 112*  BUN 8  CREATININE 0.74  CALCIUM 8.8*  GFRNONAA >60  GFRAA >60  ANIONGAP 6    No results for input(s): PROT, ALBUMIN, AST, ALT, ALKPHOS, BILITOT in the last 168 hours. Hematology Recent Labs  Lab 09/10/17 1244  WBC 6.0  RBC 3.80*  HGB 11.0*  HCT 35.1*  MCV 92.4  MCH 28.9  MCHC 31.3  RDW 14.4  PLT 223   Cardiac Enzymes Recent Labs  Lab 09/10/17 2220 09/11/17 0107 09/11/17 0313  TROPONINI <0.03 <0.03 <0.03    Recent Labs  Lab 09/10/17 1308 09/10/17 1819  TROPIPOC 0.00 0.00    BNPNo results for input(s): BNP, PROBNP in the last 168 hours.  DDimer  Recent Labs  Lab 09/10/17 1550  DDIMER 0.38    Radiology/Studies:  Ct Angio Head W Or Wo Contrast  Result Date: 09/10/2017 CLINICAL DATA:   Left neck swelling.  Left neck pain. EXAM: CT ANGIOGRAPHY HEAD AND NECK TECHNIQUE: Multidetector CT imaging of the head and neck was performed using the standard protocol during bolus administration of intravenous contrast. Multiplanar CT image reconstructions and MIPs were obtained to evaluate the vascular anatomy. Carotid stenosis measurements (when applicable) are obtained utilizing NASCET criteria, using the distal internal carotid diameter as the denominator. CONTRAST:  25mL ISOVUE-370 IOPAMIDOL (ISOVUE-370) INJECTION 76% COMPARISON:  CT of the head doubt contrast same day. CT cervical spine without contrast the same day. FINDINGS: CTA NECK FINDINGS Aortic arch: There is common origin of the left common carotid artery in the innominate artery. No significant atherosclerotic changes are present at the aortic arch. There is no significant stenosis. Right carotid system: The right common carotid artery is within normal limits. Bifurcation is unremarkable. The cervical right ICA is normal. Left carotid system: The left common carotid artery is within normal limits. The bifurcation is unremarkable. The cervical left ICA is normal. Vertebral arteries: The vertebral arteries are codominant. Both vertebral arteries originate from the subclavian arteries without significant stenosis. There is no significant stenosis of either vertebral artery in the neck. Skeleton: Vertebral body heights and alignment are maintained mild endplate degenerative changes present at C5-6. No focal lytic or blastic lesions are present. Other neck: A calcified left thyroid nodule measures up to 14 mm. The left lobe of the thyroid is asymmetric. No significant cervical adenopathy is present. Salivary glands are within normal limits bilaterally. Upper chest: Mild dependent atelectasis is present in the lung apices. No focal nodule, mass, or airspace disease is present. The thoracic inlet is within normal limits. Review of the MIP images confirms  the above findings CTA HEAD FINDINGS Anterior circulation: The  internal carotid arteries are within normal limits through the ICA termini bilaterally. The A1 and M1 segments are normal. The anterior communicating artery is patent. MCA bifurcations are within normal limits. ACA and MCA branch vessels are within normal limits. Posterior circulation: The PICA origins are visualized and normal. Vertebrobasilar junction is normal. The basilar artery is within normal limits. Both posterior cerebral arteries originate from the basilar tip. PCA branch vessels are within normal limits. Venous sinuses: The dural sinuses are patent. The straight sinus deep cerebral veins are intact. Cortical veins are unremarkable. Anatomic variants: None Delayed phase: The postcontrast images demonstrate no pathologic enhancement. Review of the MIP images confirms the above findings IMPRESSION: 1. Negative CTA of the neck. No significant atherosclerotic change or stenosis. 2. Mild endplate degenerative changes of the cervical spine are most pronounced at C5-6. 3. Calcified left thyroid nodule. Multinodular goiter has been worked up previously. No follow-up is necessary. 4. Normal variant CTA circle of Willis without significant proximal stenosis, aneurysm, or branch vessel occlusion. Electronically Signed   By: San Morelle M.D.   On: 09/10/2017 19:23   Dg Chest 2 View  Result Date: 09/10/2017 CLINICAL DATA:  Arm and neck region pain EXAM: CHEST - 2 VIEW COMPARISON:  March 03, 2014 FINDINGS: There is no appreciable edema or consolidation. Heart size and pulmonary vascularity are normal. No. No pneumothorax. No bone lesions. IMPRESSION: No edema or consolidation. Electronically Signed   By: Lowella Grip III M.D.   On: 09/10/2017 13:25   Ct Head Wo Contrast  Result Date: 09/10/2017 CLINICAL DATA:  Acute severe headache. EXAM: CT HEAD WITHOUT CONTRAST TECHNIQUE: Contiguous axial images were obtained from the base of the  skull through the vertex without intravenous contrast. COMPARISON:  None. FINDINGS: Brain: No evidence of acute infarction, hemorrhage, hydrocephalus, extra-axial collection, or mass lesion/mass effect. Vascular:  No hyperdense vessel or other acute findings. Skull: No evidence of fracture or other significant bone abnormality. Sinuses/Orbits:  No acute findings. Other: None. IMPRESSION: Negative noncontrast head CT. Electronically Signed   By: Earle Gell M.D.   On: 09/10/2017 17:20   Ct Angio Neck W And/or Wo Contrast  Result Date: 09/10/2017 CLINICAL DATA:  Left neck swelling.  Left neck pain. EXAM: CT ANGIOGRAPHY HEAD AND NECK TECHNIQUE: Multidetector CT imaging of the head and neck was performed using the standard protocol during bolus administration of intravenous contrast. Multiplanar CT image reconstructions and MIPs were obtained to evaluate the vascular anatomy. Carotid stenosis measurements (when applicable) are obtained utilizing NASCET criteria, using the distal internal carotid diameter as the denominator. CONTRAST:  3mL ISOVUE-370 IOPAMIDOL (ISOVUE-370) INJECTION 76% COMPARISON:  CT of the head doubt contrast same day. CT cervical spine without contrast the same day. FINDINGS: CTA NECK FINDINGS Aortic arch: There is common origin of the left common carotid artery in the innominate artery. No significant atherosclerotic changes are present at the aortic arch. There is no significant stenosis. Right carotid system: The right common carotid artery is within normal limits. Bifurcation is unremarkable. The cervical right ICA is normal. Left carotid system: The left common carotid artery is within normal limits. The bifurcation is unremarkable. The cervical left ICA is normal. Vertebral arteries: The vertebral arteries are codominant. Both vertebral arteries originate from the subclavian arteries without significant stenosis. There is no significant stenosis of either vertebral artery in the neck.  Skeleton: Vertebral body heights and alignment are maintained mild endplate degenerative changes present at C5-6. No focal lytic or blastic lesions are  present. Other neck: A calcified left thyroid nodule measures up to 14 mm. The left lobe of the thyroid is asymmetric. No significant cervical adenopathy is present. Salivary glands are within normal limits bilaterally. Upper chest: Mild dependent atelectasis is present in the lung apices. No focal nodule, mass, or airspace disease is present. The thoracic inlet is within normal limits. Review of the MIP images confirms the above findings CTA HEAD FINDINGS Anterior circulation: The internal carotid arteries are within normal limits through the ICA termini bilaterally. The A1 and M1 segments are normal. The anterior communicating artery is patent. MCA bifurcations are within normal limits. ACA and MCA branch vessels are within normal limits. Posterior circulation: The PICA origins are visualized and normal. Vertebrobasilar junction is normal. The basilar artery is within normal limits. Both posterior cerebral arteries originate from the basilar tip. PCA branch vessels are within normal limits. Venous sinuses: The dural sinuses are patent. The straight sinus deep cerebral veins are intact. Cortical veins are unremarkable. Anatomic variants: None Delayed phase: The postcontrast images demonstrate no pathologic enhancement. Review of the MIP images confirms the above findings IMPRESSION: 1. Negative CTA of the neck. No significant atherosclerotic change or stenosis. 2. Mild endplate degenerative changes of the cervical spine are most pronounced at C5-6. 3. Calcified left thyroid nodule. Multinodular goiter has been worked up previously. No follow-up is necessary. 4. Normal variant CTA circle of Willis without significant proximal stenosis, aneurysm, or branch vessel occlusion. Electronically Signed   By: San Morelle M.D.   On: 09/10/2017 19:23   Ct C-spine No  Charge  Result Date: 09/10/2017 CLINICAL DATA:  Neck and left arm pain. EXAM: CT CERVICAL SPINE WITHOUT CONTRAST TECHNIQUE: Multidetector CT imaging of the cervical spine was performed without intravenous contrast. Multiplanar CT image reconstructions were also generated. COMPARISON:  None. FINDINGS: Alignment: Straightening of the normal cervical lordosis. Skull base and vertebrae: No acute fracture. No primary bone lesion or focal pathologic process. Soft tissues and spinal canal: Negative. Disc levels: Mild disc height loss, endplate spurring, and uncovertebral hypertrophy at C5-C6 and C6-C7. No significant spinal canal or neuroforaminal stenosis. Upper chest: Negative. Other: 1.4 cm calcified left thyroid nodule. IMPRESSION: 1.  No acute osseous abnormality. 2. Mild degenerative changes at C5-C6 and C6-C7. Electronically Signed   By: Titus Dubin M.D.   On: 09/10/2017 19:58    Assessment and Plan:   Kimberly Hanson is a 52 y.o. female with a hx of tobacco use, family h/o CAD, hypothyroidism treated with hormone replacement and seizure disorder who is being seen today for the evaluation of chest pain and bradycardia at the request of Dr. Roel Cluck, Internal Medicine.  1. Chest Pain and Bradycardia: clinical presentation c/w unstable angina. 52 y/o female with several cardiac risk factors including daily tobacco use and family h/o CAD (mother w/ MI in her 38s), with new onset left chest, shoulder and jaw pain, awakening her from her sleep. There has also been a new change in her baseline HR. Serial EKGs and telemetry have shown persistent bradycardia with HRs in the upper 30s-50s. Pt notes baseline HR typically runs in the 70s-80s. There has been no dizziness, syncope/ near syncope. Home meds reviewed. She is not on any AV nodal blocking agents. She has hypothyroidism, treated with levothyroxine, however TSH is WNL. Free T3 and T4 pending. K is WNL. Ca mildly low at 8.8. 2D echo pending. D-dimer negative,  ruling out PE. Her troponins are negative x 3, ruling out MI. However there  is high concern for underlying CAD. With her new bradycardia, ? RCA involvement. Will plan further ischemic w/u. Will discuss w/ MD coronary CTA today vs waiting for cath on Monday. Will check FLP to obtain baseline LDL. No AV nodal blocking agents. Continue to monitor on tele.   For questions or updates, please contact [facility] Please consult [email or web address] for contact info under Cardiology/STEMI.   Signed, Lyda Jester, PA-C  09/11/2017 7:55 AM    Patient seen and examined.  Chest pain with typical and atypical features.  While left-sided and radiating, persistent times greater than 24 hours.  Not relieved or aggravated by anything specific.  Negative cardiac enzymes.  D-dimer negative.  Cardiac risk factors as noted above.  I discussed with Dr. Meda Coffee.  Proceed with CTA.  Also noted to be bradycardic but reviewing her past medical record, this was present also some years ago.

## 2017-09-11 NOTE — Plan of Care (Signed)
  Problem: Pain Managment: Goal: General experience of comfort will improve Outcome: Progressing   

## 2017-09-11 NOTE — Progress Notes (Signed)
Patient ID: Kimberly Hanson, female   DOB: 12/31/1965, 52 y.o.   MRN: [id number]  PROGRESS NOTE    Sarenity Ramaker  ZSW:[id number] DOB: Apr 12, 1965 DOA: 09/10/2017 PCP: Elwyn Reach, MD   Brief Narrative: (Start on day 1 of progress note - keep it brief and live) 52 year old female with history of hypothyroidism and seizure disorder admitted for atypical chest pain.  Cardiology consulted.  Troponins have been normal EKG with sinus bradycardia as low as in the 40s with no other acute changes.  Work-up pending per cardiology recommendations including CT angiography first.   Assessment & Plan:   Active Problems:   Hypothyroidism following radioiodine therapy   Bradycardia   Chest pressure   Atypical chest pain-negative troponins and nonischemic EKG but bradycardic.  D-dimer negative.  Chest x-ray negative.  CT angiogram ending.  Echocardiogram pending.  Is on aspirin.  Depending on those results cardiology will proceed with further testing.  Aspirin not given due to an anaphylaxis to naproxen in the past.  Hypo-thyroidism-continue thyroid supplementation   DVT prophylaxis: SCDs  Code Status: Full  Family Communication: None  Disposition Plan: Per cardiology work-up   Consultants:    cardiology    Subjective: Patient feels better and has no complaints.  No chest pain since admission.  Objective: Vitals:   09/11/17 0200 09/11/17 0246 09/11/17 0259 09/11/17 0936  BP: (!) 101/52  106/66   Pulse: (!) 48 (!) 46 (!) 47   Resp: 15 18 18    Temp:  98.8 F (37.1 C)    TempSrc:  Oral    SpO2: 99% 100% 100%   Weight:    108.5 kg (239 lb 3.2 oz)  Height:    5\' 6"  (1.676 m)    Intake/Output Summary (Last 24 hours) at 09/11/2017 1153 Last data filed at 09/10/2017 2151 Gross per 24 hour  Intake 1000 ml  Output -  Net 1000 ml   Filed Weights   09/11/17 0936  Weight: 108.5 kg (239 lb 3.2 oz)    Examination:  General exam: Appears calm and comfortable  Respiratory system: Clear to  auscultation. Respiratory effort normal. Cardiovascular system: S1 & S2 heard, RRR. No JVD, murmurs, rubs, gallops or clicks. No pedal edema. Gastrointestinal system: Abdomen is nondistended, soft and nontender. No organomegaly or masses felt. Normal bowel sounds heard. Central nervous system: Alert and oriented. No focal neurological deficits. Extremities: Symmetric 5 x 5 power. Skin: No rashes, lesions or ulcers Psychiatry: Judgement and insight appear normal. Mood & affect appropriate.     Data Reviewed: I have personally reviewed following labs and imaging studies  CBC: Recent Labs  Lab 09/10/17 1244  WBC 6.0  HGB 11.0*  HCT 35.1*  MCV 92.4  PLT 322   Basic Metabolic Panel: Recent Labs  Lab 09/10/17 1244  NA 140  K 3.9  CL 111  CO2 23  GLUCOSE 112*  BUN 8  CREATININE 0.74  CALCIUM 8.8*   GFR: Estimated Creatinine Clearance: 104.9 mL/min (by C-G formula based on SCr of 0.74 mg/dL). Liver Function Tests: No results for input(s): AST, ALT, ALKPHOS, BILITOT, PROT, ALBUMIN in the last 168 hours. No results for input(s): LIPASE, AMYLASE in the last 168 hours. No results for input(s): AMMONIA in the last 168 hours. Coagulation Profile: No results for input(s): INR, PROTIME in the last 168 hours. Cardiac Enzymes: Recent Labs  Lab 09/10/17 1709 09/10/17 2220 09/11/17 0107 09/11/17 0313 09/11/17 0946  CKTOTAL 156  --   --   --   --  TROPONINI  --  <0.03 <0.03 <0.03 <0.03   BNP (last 3 results) No results for input(s): PROBNP in the last 8760 hours. HbA1C: No results for input(s): HGBA1C in the last 72 hours. CBG: No results for input(s): GLUCAP in the last 168 hours. Lipid Profile: No results for input(s): CHOL, HDL, LDLCALC, TRIG, CHOLHDL, LDLDIRECT in the last 72 hours. Thyroid Function Tests: Recent Labs    09/10/17 2005 09/10/17 2220  TSH 3.031  --   FREET4  --  0.94   Anemia Panel: No results for input(s): VITAMINB12, FOLATE, FERRITIN, TIBC,  IRON, RETICCTPCT in the last 72 hours. Sepsis Labs: No results for input(s): PROCALCITON, LATICACIDVEN in the last 168 hours.  Recent Results (from the past 240 hour(s))  MRSA PCR Screening     Status: None   Collection Time: 09/11/17  2:45 AM  Result Value Ref Range Status   MRSA by PCR NEGATIVE NEGATIVE Final    Comment:        The GeneXpert MRSA Assay (FDA approved for NASAL specimens only), is one component of a comprehensive MRSA colonization surveillance program. It is not intended to diagnose MRSA infection nor to guide or monitor treatment for MRSA infections. Performed at [facility], [place] [address], [place], [place] [address]          Radiology Studies: Ct Angio Head W Or Wo Contrast  Result Date: 09/10/2017 CLINICAL DATA:  Left neck swelling.  Left neck pain. EXAM: CT ANGIOGRAPHY HEAD AND NECK TECHNIQUE: Multidetector CT imaging of the head and neck was performed using the standard protocol during bolus administration of intravenous contrast. Multiplanar CT image reconstructions and MIPs were obtained to evaluate the vascular anatomy. Carotid stenosis measurements (when applicable) are obtained utilizing NASCET criteria, using the distal internal carotid diameter as the denominator. CONTRAST:  41mL ISOVUE-370 IOPAMIDOL (ISOVUE-370) INJECTION 76% COMPARISON:  CT of the head doubt contrast same day. CT cervical spine without contrast the same day. FINDINGS: CTA NECK FINDINGS Aortic arch: There is common origin of the left common carotid artery in the innominate artery. No significant atherosclerotic changes are present at the aortic arch. There is no significant stenosis. Right carotid system: The right common carotid artery is within normal limits. Bifurcation is unremarkable. The cervical right ICA is normal. Left carotid system: The left common carotid artery is within normal limits. The bifurcation is unremarkable. The cervical left ICA is normal. Vertebral arteries:  The vertebral arteries are codominant. Both vertebral arteries originate from the subclavian arteries without significant stenosis. There is no significant stenosis of either vertebral artery in the neck. Skeleton: Vertebral body heights and alignment are maintained mild endplate degenerative changes present at C5-6. No focal lytic or blastic lesions are present. Other neck: A calcified left thyroid nodule measures up to 14 mm. The left lobe of the thyroid is asymmetric. No significant cervical adenopathy is present. Salivary glands are within normal limits bilaterally. Upper chest: Mild dependent atelectasis is present in the lung apices. No focal nodule, mass, or airspace disease is present. The thoracic inlet is within normal limits. Review of the MIP images confirms the above findings CTA HEAD FINDINGS Anterior circulation: The internal carotid arteries are within normal limits through the ICA termini bilaterally. The A1 and M1 segments are normal. The anterior communicating artery is patent. MCA bifurcations are within normal limits. ACA and MCA branch vessels are within normal limits. Posterior circulation: The PICA origins are visualized and normal. Vertebrobasilar junction is normal. The basilar  artery is within normal limits. Both posterior cerebral arteries originate from the basilar tip. PCA branch vessels are within normal limits. Venous sinuses: The dural sinuses are patent. The straight sinus deep cerebral veins are intact. Cortical veins are unremarkable. Anatomic variants: None Delayed phase: The postcontrast images demonstrate no pathologic enhancement. Review of the MIP images confirms the above findings IMPRESSION: 1. Negative CTA of the neck. No significant atherosclerotic change or stenosis. 2. Mild endplate degenerative changes of the cervical spine are most pronounced at C5-6. 3. Calcified left thyroid nodule. Multinodular goiter has been worked up previously. No follow-up is necessary. 4.  Normal variant CTA circle of Willis without significant proximal stenosis, aneurysm, or branch vessel occlusion. Electronically Signed   By: San Morelle M.D.   On: 09/10/2017 19:23   Dg Chest 2 View  Result Date: 09/10/2017 CLINICAL DATA:  Arm and neck region pain EXAM: CHEST - 2 VIEW COMPARISON:  March 03, 2014 FINDINGS: There is no appreciable edema or consolidation. Heart size and pulmonary vascularity are normal. No. No pneumothorax. No bone lesions. IMPRESSION: No edema or consolidation. Electronically Signed   By: Lowella Grip III M.D.   On: 09/10/2017 13:25   Ct Head Wo Contrast  Result Date: 09/10/2017 CLINICAL DATA:  Acute severe headache. EXAM: CT HEAD WITHOUT CONTRAST TECHNIQUE: Contiguous axial images were obtained from the base of the skull through the vertex without intravenous contrast. COMPARISON:  None. FINDINGS: Brain: No evidence of acute infarction, hemorrhage, hydrocephalus, extra-axial collection, or mass lesion/mass effect. Vascular:  No hyperdense vessel or other acute findings. Skull: No evidence of fracture or other significant bone abnormality. Sinuses/Orbits:  No acute findings. Other: None. IMPRESSION: Negative noncontrast head CT. Electronically Signed   By: Earle Gell M.D.   On: 09/10/2017 17:20   Ct Angio Neck W And/or Wo Contrast  Result Date: 09/10/2017 CLINICAL DATA:  Left neck swelling.  Left neck pain. EXAM: CT ANGIOGRAPHY HEAD AND NECK TECHNIQUE: Multidetector CT imaging of the head and neck was performed using the standard protocol during bolus administration of intravenous contrast. Multiplanar CT image reconstructions and MIPs were obtained to evaluate the vascular anatomy. Carotid stenosis measurements (when applicable) are obtained utilizing NASCET criteria, using the distal internal carotid diameter as the denominator. CONTRAST:  26mL ISOVUE-370 IOPAMIDOL (ISOVUE-370) INJECTION 76% COMPARISON:  CT of the head doubt contrast same day. CT cervical  spine without contrast the same day. FINDINGS: CTA NECK FINDINGS Aortic arch: There is common origin of the left common carotid artery in the innominate artery. No significant atherosclerotic changes are present at the aortic arch. There is no significant stenosis. Right carotid system: The right common carotid artery is within normal limits. Bifurcation is unremarkable. The cervical right ICA is normal. Left carotid system: The left common carotid artery is within normal limits. The bifurcation is unremarkable. The cervical left ICA is normal. Vertebral arteries: The vertebral arteries are codominant. Both vertebral arteries originate from the subclavian arteries without significant stenosis. There is no significant stenosis of either vertebral artery in the neck. Skeleton: Vertebral body heights and alignment are maintained mild endplate degenerative changes present at C5-6. No focal lytic or blastic lesions are present. Other neck: A calcified left thyroid nodule measures up to 14 mm. The left lobe of the thyroid is asymmetric. No significant cervical adenopathy is present. Salivary glands are within normal limits bilaterally. Upper chest: Mild dependent atelectasis is present in the lung apices. No focal nodule, mass, or airspace disease is present. The  thoracic inlet is within normal limits. Review of the MIP images confirms the above findings CTA HEAD FINDINGS Anterior circulation: The internal carotid arteries are within normal limits through the ICA termini bilaterally. The A1 and M1 segments are normal. The anterior communicating artery is patent. MCA bifurcations are within normal limits. ACA and MCA branch vessels are within normal limits. Posterior circulation: The PICA origins are visualized and normal. Vertebrobasilar junction is normal. The basilar artery is within normal limits. Both posterior cerebral arteries originate from the basilar tip. PCA branch vessels are within normal limits. Venous  sinuses: The dural sinuses are patent. The straight sinus deep cerebral veins are intact. Cortical veins are unremarkable. Anatomic variants: None Delayed phase: The postcontrast images demonstrate no pathologic enhancement. Review of the MIP images confirms the above findings IMPRESSION: 1. Negative CTA of the neck. No significant atherosclerotic change or stenosis. 2. Mild endplate degenerative changes of the cervical spine are most pronounced at C5-6. 3. Calcified left thyroid nodule. Multinodular goiter has been worked up previously. No follow-up is necessary. 4. Normal variant CTA circle of Willis without significant proximal stenosis, aneurysm, or branch vessel occlusion. Electronically Signed   By: San Morelle M.D.   On: 09/10/2017 19:23   Ct C-spine No Charge  Result Date: 09/10/2017 CLINICAL DATA:  Neck and left arm pain. EXAM: CT CERVICAL SPINE WITHOUT CONTRAST TECHNIQUE: Multidetector CT imaging of the cervical spine was performed without intravenous contrast. Multiplanar CT image reconstructions were also generated. COMPARISON:  None. FINDINGS: Alignment: Straightening of the normal cervical lordosis. Skull base and vertebrae: No acute fracture. No primary bone lesion or focal pathologic process. Soft tissues and spinal canal: Negative. Disc levels: Mild disc height loss, endplate spurring, and uncovertebral hypertrophy at C5-C6 and C6-C7. No significant spinal canal or neuroforaminal stenosis. Upper chest: Negative. Other: 1.4 cm calcified left thyroid nodule. IMPRESSION: 1.  No acute osseous abnormality. 2. Mild degenerative changes at C5-C6 and C6-C7. Electronically Signed   By: Titus Dubin M.D.   On: 09/10/2017 19:58        Scheduled Meds: . enoxaparin (LOVENOX) injection  40 mg Subcutaneous Daily  . iopamidol      . levothyroxine  200 mcg Oral QAC breakfast  . nitroGLYCERIN       Continuous Infusions:   LOS: 0 days    Time spent: 20 minutes    Duvid Smalls A,  MD [facility] Pager [phone]  If 7PM-7AM, please contact night-coverage [email or web address] Password [email or web address] 09/11/2017, 11:53 AM

## 2017-09-11 NOTE — Progress Notes (Signed)
Pt without complaints of. Reported off to RN assuming care of the patient, Pt medicated x 2. Cont with plan of care

## 2017-09-12 ENCOUNTER — Observation Stay (HOSPITAL_BASED_OUTPATIENT_CLINIC_OR_DEPARTMENT_OTHER): Payer: BLUE CROSS/BLUE SHIELD

## 2017-09-12 DIAGNOSIS — R001 Bradycardia, unspecified: Secondary | ICD-10-CM | POA: Diagnosis not present

## 2017-09-12 DIAGNOSIS — R072 Precordial pain: Secondary | ICD-10-CM | POA: Diagnosis not present

## 2017-09-12 DIAGNOSIS — I361 Nonrheumatic tricuspid (valve) insufficiency: Secondary | ICD-10-CM | POA: Diagnosis not present

## 2017-09-12 DIAGNOSIS — E890 Postprocedural hypothyroidism: Secondary | ICD-10-CM | POA: Diagnosis not present

## 2017-09-12 LAB — ECHOCARDIOGRAM COMPLETE
HEIGHTINCHES: 66 in
Weight: 3734.4 oz

## 2017-09-12 LAB — T3: T3, Total: 84 ng/dL (ref 71–180)

## 2017-09-12 NOTE — Discharge Instructions (Signed)
1) follow-up with your regular doctor/PCP to schedule outpatient sleep study to see if you have obstructive sleep apnea 2) smoking cessation strongly advised-may use over-the-counter nicotine patch to help you quit smoking 3) your triglycerides are high and you have too liitle good cholesterol (HDL) --low-fat, low-cholesterol diet as well as increase activity strongly advised 4) follow-up with your endocrinologist to recheck your thyroid test as previously advised by your endocrinologist--your thyroid function test appeared to be within normal limits at this time 5)24-hour Holter with the patient participating in typical activities--due to asymptomatic bradycardia

## 2017-09-12 NOTE — Progress Notes (Addendum)
The patient has been seen in conjunction with Reino Bellis, NP. All aspects of care have been considered and discussed. The patient has been personally interviewed, examined, and all clinical data has been reviewed.   Coronary CT angios and calcium score demonstrate no significant CAD.  No further evaluation is needed for chest pain at this time.  Asymptomatic sinus bradycardia requires no current therapy.  I would recommend a 24-hour Holter with the patient participating in typical activities.  Agree with recommendation to consider sleep study.  Ensure the patient is not hypothyroid.  Otherwise no specific cardiac recommendations.  Please call if we may be of further assistance.  Progress Note  Patient Name: Kimberly Hanson Date of Encounter: 09/12/2017  Primary Cardiologist: No primary care provider on file.  Subjective   Feeling well this morning.   Inpatient Medications    Scheduled Meds: . enoxaparin (LOVENOX) injection  40 mg Subcutaneous Daily  . levothyroxine  200 mcg Oral QAC breakfast   Continuous Infusions:  PRN Meds: acetaminophen, morphine injection, nitroGLYCERIN, ondansetron (ZOFRAN) IV   Vital Signs    Vitals:   09/11/17 1600 09/11/17 1900 09/11/17 1936 09/12/17 0436  BP: 123/74 104/69 104/69 99/69  Pulse:   (!) 44 (!) 44  Resp:   18 16  Temp:   98.2 F (36.8 C) 98.7 F (37.1 C)  TempSrc:   Oral Oral  SpO2:   100% 99%  Weight:    233 lb 6.4 oz (105.9 kg)  Height:       No intake or output data in the 24 hours ending 09/12/17 0848 Filed Weights   09/11/17 0936 09/12/17 0436  Weight: 239 lb 3.2 oz (108.5 kg) 233 lb 6.4 oz (105.9 kg)    Telemetry    SB - Personally Reviewed  Physical Exam   General: Well developed, well nourished, AA female appearing in no acute distress. Head: Normocephalic, atraumatic.  Neck: Supple without bruits, JVD. Lungs:  Resp regular and unlabored, CTA. Heart: RRR, S1, S2, no S3, S4, or murmur; no rub. Abdomen:  Soft, non-tender, non-distended with normoactive bowel sounds.  Extremities: No clubbing, cyanosis, edema. Distal pedal pulses are 2+ bilaterally. Neuro: Alert and oriented X 3. Moves all extremities spontaneously. Psych: Normal affect.  Labs    Chemistry Recent Labs  Lab 09/10/17 1244  NA 140  K 3.9  CL 111  CO2 23  GLUCOSE 112*  BUN 8  CREATININE 0.74  CALCIUM 8.8*  GFRNONAA >60  GFRAA >60  ANIONGAP 6     Hematology Recent Labs  Lab 09/10/17 1244  WBC 6.0  RBC 3.80*  HGB 11.0*  HCT 35.1*  MCV 92.4  MCH 28.9  MCHC 31.3  RDW 14.4  PLT 223    Cardiac Enzymes Recent Labs  Lab 09/10/17 2220 09/11/17 0107 09/11/17 0313 09/11/17 0946  TROPONINI <0.03 <0.03 <0.03 <0.03    Recent Labs  Lab 09/10/17 1308 09/10/17 1819  TROPIPOC 0.00 0.00     BNPNo results for input(s): BNP, PROBNP in the last 168 hours.   DDimer  Recent Labs  Lab 09/10/17 1550  DDIMER 0.38      Radiology    Ct Angio Head W Or Wo Contrast  Result Date: 09/10/2017 CLINICAL DATA:  Left neck swelling.  Left neck pain. EXAM: CT ANGIOGRAPHY HEAD AND NECK TECHNIQUE: Multidetector CT imaging of the head and neck was performed using the standard protocol during bolus administration of intravenous contrast. Multiplanar CT image reconstructions and MIPs were obtained  to evaluate the vascular anatomy. Carotid stenosis measurements (when applicable) are obtained utilizing NASCET criteria, using the distal internal carotid diameter as the denominator. CONTRAST:  66mL ISOVUE-370 IOPAMIDOL (ISOVUE-370) INJECTION 76% COMPARISON:  CT of the head doubt contrast same day. CT cervical spine without contrast the same day. FINDINGS: CTA NECK FINDINGS Aortic arch: There is common origin of the left common carotid artery in the innominate artery. No significant atherosclerotic changes are present at the aortic arch. There is no significant stenosis. Right carotid system: The right common carotid artery is within  normal limits. Bifurcation is unremarkable. The cervical right ICA is normal. Left carotid system: The left common carotid artery is within normal limits. The bifurcation is unremarkable. The cervical left ICA is normal. Vertebral arteries: The vertebral arteries are codominant. Both vertebral arteries originate from the subclavian arteries without significant stenosis. There is no significant stenosis of either vertebral artery in the neck. Skeleton: Vertebral body heights and alignment are maintained mild endplate degenerative changes present at C5-6. No focal lytic or blastic lesions are present. Other neck: A calcified left thyroid nodule measures up to 14 mm. The left lobe of the thyroid is asymmetric. No significant cervical adenopathy is present. Salivary glands are within normal limits bilaterally. Upper chest: Mild dependent atelectasis is present in the lung apices. No focal nodule, mass, or airspace disease is present. The thoracic inlet is within normal limits. Review of the MIP images confirms the above findings CTA HEAD FINDINGS Anterior circulation: The internal carotid arteries are within normal limits through the ICA termini bilaterally. The A1 and M1 segments are normal. The anterior communicating artery is patent. MCA bifurcations are within normal limits. ACA and MCA branch vessels are within normal limits. Posterior circulation: The PICA origins are visualized and normal. Vertebrobasilar junction is normal. The basilar artery is within normal limits. Both posterior cerebral arteries originate from the basilar tip. PCA branch vessels are within normal limits. Venous sinuses: The dural sinuses are patent. The straight sinus deep cerebral veins are intact. Cortical veins are unremarkable. Anatomic variants: None Delayed phase: The postcontrast images demonstrate no pathologic enhancement. Review of the MIP images confirms the above findings IMPRESSION: 1. Negative CTA of the neck. No significant  atherosclerotic change or stenosis. 2. Mild endplate degenerative changes of the cervical spine are most pronounced at C5-6. 3. Calcified left thyroid nodule. Multinodular goiter has been worked up previously. No follow-up is necessary. 4. Normal variant CTA circle of Willis without significant proximal stenosis, aneurysm, or branch vessel occlusion. Electronically Signed   By: San Morelle M.D.   On: 09/10/2017 19:23   Dg Chest 2 View  Result Date: 09/10/2017 CLINICAL DATA:  Arm and neck region pain EXAM: CHEST - 2 VIEW COMPARISON:  March 03, 2014 FINDINGS: There is no appreciable edema or consolidation. Heart size and pulmonary vascularity are normal. No. No pneumothorax. No bone lesions. IMPRESSION: No edema or consolidation. Electronically Signed   By: Lowella Grip III M.D.   On: 09/10/2017 13:25   Ct Head Wo Contrast  Result Date: 09/10/2017 CLINICAL DATA:  Acute severe headache. EXAM: CT HEAD WITHOUT CONTRAST TECHNIQUE: Contiguous axial images were obtained from the base of the skull through the vertex without intravenous contrast. COMPARISON:  None. FINDINGS: Brain: No evidence of acute infarction, hemorrhage, hydrocephalus, extra-axial collection, or mass lesion/mass effect. Vascular:  No hyperdense vessel or other acute findings. Skull: No evidence of fracture or other significant bone abnormality. Sinuses/Orbits:  No acute findings. Other: None. IMPRESSION:  Negative noncontrast head CT. Electronically Signed   By: Earle Gell M.D.   On: 09/10/2017 17:20   Ct Angio Neck W And/or Wo Contrast  Result Date: 09/10/2017 CLINICAL DATA:  Left neck swelling.  Left neck pain. EXAM: CT ANGIOGRAPHY HEAD AND NECK TECHNIQUE: Multidetector CT imaging of the head and neck was performed using the standard protocol during bolus administration of intravenous contrast. Multiplanar CT image reconstructions and MIPs were obtained to evaluate the vascular anatomy. Carotid stenosis measurements (when  applicable) are obtained utilizing NASCET criteria, using the distal internal carotid diameter as the denominator. CONTRAST:  13mL ISOVUE-370 IOPAMIDOL (ISOVUE-370) INJECTION 76% COMPARISON:  CT of the head doubt contrast same day. CT cervical spine without contrast the same day. FINDINGS: CTA NECK FINDINGS Aortic arch: There is common origin of the left common carotid artery in the innominate artery. No significant atherosclerotic changes are present at the aortic arch. There is no significant stenosis. Right carotid system: The right common carotid artery is within normal limits. Bifurcation is unremarkable. The cervical right ICA is normal. Left carotid system: The left common carotid artery is within normal limits. The bifurcation is unremarkable. The cervical left ICA is normal. Vertebral arteries: The vertebral arteries are codominant. Both vertebral arteries originate from the subclavian arteries without significant stenosis. There is no significant stenosis of either vertebral artery in the neck. Skeleton: Vertebral body heights and alignment are maintained mild endplate degenerative changes present at C5-6. No focal lytic or blastic lesions are present. Other neck: A calcified left thyroid nodule measures up to 14 mm. The left lobe of the thyroid is asymmetric. No significant cervical adenopathy is present. Salivary glands are within normal limits bilaterally. Upper chest: Mild dependent atelectasis is present in the lung apices. No focal nodule, mass, or airspace disease is present. The thoracic inlet is within normal limits. Review of the MIP images confirms the above findings CTA HEAD FINDINGS Anterior circulation: The internal carotid arteries are within normal limits through the ICA termini bilaterally. The A1 and M1 segments are normal. The anterior communicating artery is patent. MCA bifurcations are within normal limits. ACA and MCA branch vessels are within normal limits. Posterior circulation: The  PICA origins are visualized and normal. Vertebrobasilar junction is normal. The basilar artery is within normal limits. Both posterior cerebral arteries originate from the basilar tip. PCA branch vessels are within normal limits. Venous sinuses: The dural sinuses are patent. The straight sinus deep cerebral veins are intact. Cortical veins are unremarkable. Anatomic variants: None Delayed phase: The postcontrast images demonstrate no pathologic enhancement. Review of the MIP images confirms the above findings IMPRESSION: 1. Negative CTA of the neck. No significant atherosclerotic change or stenosis. 2. Mild endplate degenerative changes of the cervical spine are most pronounced at C5-6. 3. Calcified left thyroid nodule. Multinodular goiter has been worked up previously. No follow-up is necessary. 4. Normal variant CTA circle of Willis without significant proximal stenosis, aneurysm, or branch vessel occlusion. Electronically Signed   By: San Morelle M.D.   On: 09/10/2017 19:23   Ct C-spine No Charge  Result Date: 09/10/2017 CLINICAL DATA:  Neck and left arm pain. EXAM: CT CERVICAL SPINE WITHOUT CONTRAST TECHNIQUE: Multidetector CT imaging of the cervical spine was performed without intravenous contrast. Multiplanar CT image reconstructions were also generated. COMPARISON:  None. FINDINGS: Alignment: Straightening of the normal cervical lordosis. Skull base and vertebrae: No acute fracture. No primary bone lesion or focal pathologic process. Soft tissues and spinal canal: Negative. Disc  levels: Mild disc height loss, endplate spurring, and uncovertebral hypertrophy at C5-C6 and C6-C7. No significant spinal canal or neuroforaminal stenosis. Upper chest: Negative. Other: 1.4 cm calcified left thyroid nodule. IMPRESSION: 1.  No acute osseous abnormality. 2. Mild degenerative changes at C5-C6 and C6-C7. Electronically Signed   By: Titus Dubin M.D.   On: 09/10/2017 19:58   Ct Coronary Morph W/cta Cor  W/score W/ca W/cm &/or Wo/cm  Addendum Date: 09/12/2017   ADDENDUM REPORT: 09/12/2017 08:12 EXAM: OVER-READ INTERPRETATION  CT CHEST The following report is an over-read performed by radiologist Dr. Collene Leyden [facility] on 09/12/2017. This over-read does not include interpretation of cardiac or coronary anatomy or pathology. The coronary CTA interpretation by the cardiologist is attached. COMPARISON:  None. FINDINGS: Heart is normal size. Visualized aorta is normal caliber. No adenopathy in the lower mediastinum or hila. Visualized lungs clear. No effusions. Imaging into the upper abdomen shows no acute findings. Chest wall soft tissues are unremarkable. No acute bony abnormality. IMPRESSION: No acute or significant extracardiac abnormality. Electronically Signed   By: Rolm Baptise M.D.   On: 09/12/2017 08:12   Result Date: 09/12/2017 CLINICAL DATA:  52 year old female with chest pain and bradycardia. EXAM: Cardiac/Coronary  CT TECHNIQUE: The patient was scanned on a [employer]. FINDINGS: A 120 kV prospective scan was triggered in the descending thoracic aorta at 111 HU's. Axial non-contrast 3 mm slices were carried out through the heart. The data set was analyzed on a dedicated work station and scored using the [employer]. Gantry rotation speed was 250 msecs and collimation was .6 mm. No beta blockade and 0.8 mg of sl NTG was given. The 3D data set was reconstructed in 5% intervals of the 67-82 % of the R-R cycle. Diastolic phases were analyzed on a dedicated work station using MPR, MIP and VRT modes. The patient received 80 cc of contrast. Aorta:  Normal size.  No calcifications.  No dissection. Aortic Valve:  Trileaflet.  No calcifications. Coronary Arteries:  Normal coronary origin.  Right dominance. RCA is a very large dominant artery that gives rise to PDA and PLVB. There is no plaque. Left main is a large artery that gives rise to LAD, large ramus intermedius and LCX  arteries. Left main has no plaque. LAD is a medium size vessel that has no plaque. RI is a large artery that has no plaque. LCX is a non-dominant artery that gives rise to one large OM1 branch. There is no plaque. Other findings: Normal pulmonary vein drainage into the left atrium. Normal let atrial appendage without a thrombus. IMPRESSION: 1. Coronary calcium score of 0. This was 0 percentile for age and sex matched control. 2. Normal coronary origin with right dominance. 3. No evidence of CAD. 4. Mildly dilated pulmonary artery measuring 30 mm. Electronically Signed: By: Ena Dawley On: 09/11/2017 14:11    Cardiac Studies   TTE: pending   Patient Profile     52 y.o. female with a hx of tobacco use, family h/o CAD, hypothyroidism treated with hormone replacement and seizure disorder who was seen for the evaluation of chest pain and bradycardia.  Assessment & Plan    1. Chest pain: Troponins cycled and neg x3. Underwent coronary CT yesterday which was normal with a Ca++ score of 0. No further episodes of chest pain. Ddimer was negative.    2. Bradycardia: HR in the 40-50s mostly. Does drop into the 30s during times of sleeping. Reported to have  hx of low resting HR in the past per Dr. Olin Pia note. In talking with patient she reports day time somnolence, feeling tired all the time, and snoring. Would benefit from outpatient sleep study. Discussed this with the patient.   3. HL: LDL noted at 107. Would benefit from the addition of statin therapy.   4. Hypothyroidism: Has struggled with this for sometime. Follows with endocrinologist as outpatient.   Signed, Reino Bellis, NP  09/12/2017, 8:48 AM  Pager # [phone]   For questions or updates, please contact [facility] Please consult [email or web address] for contact info under Cardiology/STEMI.

## 2017-09-12 NOTE — Progress Notes (Signed)
  Echocardiogram 2D Echocardiogram has been performed.  Darlina Sicilian M 09/12/2017, 7:51 AM

## 2017-09-12 NOTE — Discharge Summary (Signed)
Kimberly Hanson, is a 52 y.o. female  DOB January 05, 1966  MRN [id number].  Admission date:  09/10/2017  Admitting Physician  Toy Baker, MD  Discharge Date:  09/12/2017   Primary MD  Elwyn Reach, MD  Recommendations for primary care physician for things to follow:   1) follow-up with your regular doctor/PCP to schedule outpatient sleep study to see if you have obstructive sleep apnea 2) smoking cessation strongly advised-may use over-the-counter nicotine patch to help you quit smoking 3) your triglycerides are high and you have too liitle good cholesterol (HDL) --low-fat, low-cholesterol diet as well as increase activity strongly advised 4) follow-up with your endocrinologist to recheck your thyroid test as previously advised by your endocrinologist--your thyroid function test appeared to be within normal limits at this time 5)24-hour Holter with the patient participating in typical activities--due to asymptomatic bradycardia  Admission Diagnosis  Neck pain [M54.2] Chest pain, unspecified type [R07.9] Chest pressure [R07.89]   Discharge Diagnosis  Neck pain [M54.2] Chest pain, unspecified type [R07.9] Chest pressure [R07.89]    Principal Problem:   Chest pain Active Problems:   Hypothyroidism following radioiodine therapy   Bradycardia   Chest pressure      Past Medical History:  Diagnosis Date  . Cholelithiasis    On CT 06/26/13  . Diverticulosis    On CT 06/26/13  . Hyperthyroidism   . Multinodular goiter   . Seizures ([place])     Past Surgical History:  Procedure Laterality Date  . ABDOMINAL HYSTERECTOMY  2002  . CESAREAN SECTION     x 2  . COLONOSCOPY N/A 07/04/2013   Procedure: COLONOSCOPY;  Surgeon: Milus Banister, MD;  Location: [facility];  Service: Endoscopy;  Laterality: N/A;       HPI  from the history and physical done on the day of admission:    HPI: Kimberly Hanson is a 52 y.o. female with medical history significant of Hypothyroidism  , history of seizure disorder   Presented with jaw pain and   left arm without numbness or tingling sensation.  Associated with left shoulder pain. Woke her up at night she was sweaty, dizzy, nauseous. She waited for an hour, but not get better. She smokes half a pack a day , no prior hx of the same.   Reports fatigue, feels dizzy at times sometimes looses balance if stands up too quickly for the past few months.   reports associated chest pressure  no nausea vomiting no lightheadedness no abdominal pain no history of CAD  Reports extra stress recently. Has been eating drinking well.  Has been taking her thyroid medications. Some times has no appetite. She has not eaten much today,.   Reported mild headache associated.   Reports she have had occasional left leg swelling but now better.  She had surgery on her left knee. Regarding pertinent Chronic problems: history of nodular goiter and hyperthyroidism diagnosed in 2012 status post RAI rx  developing hypothyroidism thereafter While in ER: Bradycardic in ER CT angio  head and neck unremarkable    Hospital Course:    1)Atypical chest pain-negative troponins and nonischemic EKG but bradycardic.  D-dimer negative.  Chest x-ray negative.  CT angiogram and coronary calcium score demonstrates no significant CAD, cardiology consult appreciated cardiologist advises no further work-up for chest pains at this time.  Echocardiogram with preserved EF of 55 to 60%, no regional wall motion abnormalities  2)Bradycardia--asymptomatic bradycardia, outpatient sleep study and outpatient 24-hour Holter monitoring while participating in typical activities advised  3)Hypo-thyroidism-TSH, T3 and T4 within normal limits at this time continue thyroid supplementation  4) obesity and dyslipidemia--- lifestyle and dietary modifications advised  Discharge Condition: stable  Follow  UP- holter and Sleep study with pcp  Consults obtained - cardiology  Diet and Activity recommendation:  As advised  Discharge Instructions     Discharge Instructions    Call MD for:  difficulty breathing, headache or visual disturbances   Complete by:  As directed    Call MD for:  persistant dizziness or light-headedness   Complete by:  As directed    Call MD for:  persistant nausea and vomiting   Complete by:  As directed    Call MD for:  severe uncontrolled pain   Complete by:  As directed    Call MD for:  temperature >100.4   Complete by:  As directed    Diet - low sodium heart healthy   Complete by:  As directed    Discharge instructions   Complete by:  As directed    1) follow-up with your regular doctor/PCP to schedule outpatient sleep study to see if you have obstructive sleep apnea 2) smoking cessation strongly advised-may use over-the-counter nicotine patch to help you quit smoking 3) your triglycerides are high and you have too liitle good cholesterol (HDL) --low-fat, low-cholesterol diet as well as increase activity strongly advised 4) follow-up with your endocrinologist to recheck your thyroid test as previously advised by your endocrinologist--your thyroid function test appeared to be within normal limits at this time 5)24-hour Holter with the patient participating in typical activities--due to asymptomatic bradycardia   Increase activity slowly   Complete by:  As directed         Discharge Medications     Allergies as of 09/12/2017      Reactions   Naproxen Anaphylaxis      Medication List    STOP taking these medications   meloxicam 15 MG tablet Commonly known as:  MOBIC     TAKE these medications   acetaminophen 500 MG tablet Commonly known as:  TYLENOL Take 1,000 mg by mouth every 6 (six) hours as needed for mild pain.   albuterol 108 (90 Base) MCG/ACT inhaler Commonly known as:  PROVENTIL HFA;VENTOLIN HFA Inhale 1-2 puffs into the lungs every  6 (six) hours as needed for wheezing or shortness of breath.   levothyroxine 200 MCG tablet Commonly known as:  SYNTHROID, LEVOTHROID Take 1 tablet (200 mcg total) by mouth daily before breakfast.   multivitamin with minerals Tabs tablet Take 1 tablet by mouth daily.   oxyCODONE 5 MG immediate release tablet Commonly known as:  Oxy IR/ROXICODONE Take 5 mg by mouth every 4 (four) hours as needed for severe pain.   zolpidem 5 MG tablet Commonly known as:  AMBIEN Take 5 mg by mouth at bedtime as needed for sleep.       Major procedures and Radiology Reports - PLEASE review detailed and final reports for all details, in brief -  Ct Angio Head W Or Wo Contrast  Result Date: 09/10/2017 CLINICAL DATA:  Left neck swelling.  Left neck pain. EXAM: CT ANGIOGRAPHY HEAD AND NECK TECHNIQUE: Multidetector CT imaging of the head and neck was performed using the standard protocol during bolus administration of intravenous contrast. Multiplanar CT image reconstructions and MIPs were obtained to evaluate the vascular anatomy. Carotid stenosis measurements (when applicable) are obtained utilizing NASCET criteria, using the distal internal carotid diameter as the denominator. CONTRAST:  80mL ISOVUE-370 IOPAMIDOL (ISOVUE-370) INJECTION 76% COMPARISON:  CT of the head doubt contrast same day. CT cervical spine without contrast the same day. FINDINGS: CTA NECK FINDINGS Aortic arch: There is common origin of the left common carotid artery in the innominate artery. No significant atherosclerotic changes are present at the aortic arch. There is no significant stenosis. Right carotid system: The right common carotid artery is within normal limits. Bifurcation is unremarkable. The cervical right ICA is normal. Left carotid system: The left common carotid artery is within normal limits. The bifurcation is unremarkable. The cervical left ICA is normal. Vertebral arteries: The vertebral arteries are codominant. Both  vertebral arteries originate from the subclavian arteries without significant stenosis. There is no significant stenosis of either vertebral artery in the neck. Skeleton: Vertebral body heights and alignment are maintained mild endplate degenerative changes present at C5-6. No focal lytic or blastic lesions are present. Other neck: A calcified left thyroid nodule measures up to 14 mm. The left lobe of the thyroid is asymmetric. No significant cervical adenopathy is present. Salivary glands are within normal limits bilaterally. Upper chest: Mild dependent atelectasis is present in the lung apices. No focal nodule, mass, or airspace disease is present. The thoracic inlet is within normal limits. Review of the MIP images confirms the above findings CTA HEAD FINDINGS Anterior circulation: The internal carotid arteries are within normal limits through the ICA termini bilaterally. The A1 and M1 segments are normal. The anterior communicating artery is patent. MCA bifurcations are within normal limits. ACA and MCA branch vessels are within normal limits. Posterior circulation: The PICA origins are visualized and normal. Vertebrobasilar junction is normal. The basilar artery is within normal limits. Both posterior cerebral arteries originate from the basilar tip. PCA branch vessels are within normal limits. Venous sinuses: The dural sinuses are patent. The straight sinus deep cerebral veins are intact. Cortical veins are unremarkable. Anatomic variants: None Delayed phase: The postcontrast images demonstrate no pathologic enhancement. Review of the MIP images confirms the above findings IMPRESSION: 1. Negative CTA of the neck. No significant atherosclerotic change or stenosis. 2. Mild endplate degenerative changes of the cervical spine are most pronounced at C5-6. 3. Calcified left thyroid nodule. Multinodular goiter has been worked up previously. No follow-up is necessary. 4. Normal variant CTA circle of Willis without  significant proximal stenosis, aneurysm, or branch vessel occlusion. Electronically Signed   By: San Morelle M.D.   On: 09/10/2017 19:23   Dg Chest 2 View  Result Date: 09/10/2017 CLINICAL DATA:  Arm and neck region pain EXAM: CHEST - 2 VIEW COMPARISON:  March 03, 2014 FINDINGS: There is no appreciable edema or consolidation. Heart size and pulmonary vascularity are normal. No. No pneumothorax. No bone lesions. IMPRESSION: No edema or consolidation. Electronically Signed   By: Lowella Grip III M.D.   On: 09/10/2017 13:25   Ct Head Wo Contrast  Result Date: 09/10/2017 CLINICAL DATA:  Acute severe headache. EXAM: CT HEAD WITHOUT CONTRAST TECHNIQUE: Contiguous axial images were obtained from  the base of the skull through the vertex without intravenous contrast. COMPARISON:  None. FINDINGS: Brain: No evidence of acute infarction, hemorrhage, hydrocephalus, extra-axial collection, or mass lesion/mass effect. Vascular:  No hyperdense vessel or other acute findings. Skull: No evidence of fracture or other significant bone abnormality. Sinuses/Orbits:  No acute findings. Other: None. IMPRESSION: Negative noncontrast head CT. Electronically Signed   By: Kimberly Gell M.D.   On: 09/10/2017 17:20   Ct Angio Neck W And/or Wo Contrast  Result Date: 09/10/2017 CLINICAL DATA:  Left neck swelling.  Left neck pain. EXAM: CT ANGIOGRAPHY HEAD AND NECK TECHNIQUE: Multidetector CT imaging of the head and neck was performed using the standard protocol during bolus administration of intravenous contrast. Multiplanar CT image reconstructions and MIPs were obtained to evaluate the vascular anatomy. Carotid stenosis measurements (when applicable) are obtained utilizing NASCET criteria, using the distal internal carotid diameter as the denominator. CONTRAST:  50mL ISOVUE-370 IOPAMIDOL (ISOVUE-370) INJECTION 76% COMPARISON:  CT of the head doubt contrast same day. CT cervical spine without contrast the same day.  FINDINGS: CTA NECK FINDINGS Aortic arch: There is common origin of the left common carotid artery in the innominate artery. No significant atherosclerotic changes are present at the aortic arch. There is no significant stenosis. Right carotid system: The right common carotid artery is within normal limits. Bifurcation is unremarkable. The cervical right ICA is normal. Left carotid system: The left common carotid artery is within normal limits. The bifurcation is unremarkable. The cervical left ICA is normal. Vertebral arteries: The vertebral arteries are codominant. Both vertebral arteries originate from the subclavian arteries without significant stenosis. There is no significant stenosis of either vertebral artery in the neck. Skeleton: Vertebral body heights and alignment are maintained mild endplate degenerative changes present at C5-6. No focal lytic or blastic lesions are present. Other neck: A calcified left thyroid nodule measures up to 14 mm. The left lobe of the thyroid is asymmetric. No significant cervical adenopathy is present. Salivary glands are within normal limits bilaterally. Upper chest: Mild dependent atelectasis is present in the lung apices. No focal nodule, mass, or airspace disease is present. The thoracic inlet is within normal limits. Review of the MIP images confirms the above findings CTA HEAD FINDINGS Anterior circulation: The internal carotid arteries are within normal limits through the ICA termini bilaterally. The A1 and M1 segments are normal. The anterior communicating artery is patent. MCA bifurcations are within normal limits. ACA and MCA branch vessels are within normal limits. Posterior circulation: The PICA origins are visualized and normal. Vertebrobasilar junction is normal. The basilar artery is within normal limits. Both posterior cerebral arteries originate from the basilar tip. PCA branch vessels are within normal limits. Venous sinuses: The dural sinuses are patent. The  straight sinus deep cerebral veins are intact. Cortical veins are unremarkable. Anatomic variants: None Delayed phase: The postcontrast images demonstrate no pathologic enhancement. Review of the MIP images confirms the above findings IMPRESSION: 1. Negative CTA of the neck. No significant atherosclerotic change or stenosis. 2. Mild endplate degenerative changes of the cervical spine are most pronounced at C5-6. 3. Calcified left thyroid nodule. Multinodular goiter has been worked up previously. No follow-up is necessary. 4. Normal variant CTA circle of Willis without significant proximal stenosis, aneurysm, or branch vessel occlusion. Electronically Signed   By: San Morelle M.D.   On: 09/10/2017 19:23   Ct C-spine No Charge  Result Date: 09/10/2017 CLINICAL DATA:  Neck and left arm pain. EXAM: CT CERVICAL  SPINE WITHOUT CONTRAST TECHNIQUE: Multidetector CT imaging of the cervical spine was performed without intravenous contrast. Multiplanar CT image reconstructions were also generated. COMPARISON:  None. FINDINGS: Alignment: Straightening of the normal cervical lordosis. Skull base and vertebrae: No acute fracture. No primary bone lesion or focal pathologic process. Soft tissues and spinal canal: Negative. Disc levels: Mild disc height loss, endplate spurring, and uncovertebral hypertrophy at C5-C6 and C6-C7. No significant spinal canal or neuroforaminal stenosis. Upper chest: Negative. Other: 1.4 cm calcified left thyroid nodule. IMPRESSION: 1.  No acute osseous abnormality. 2. Mild degenerative changes at C5-C6 and C6-C7. Electronically Signed   By: Titus Dubin M.D.   On: 09/10/2017 19:58   Ct Coronary Morph W/cta Cor W/score W/ca W/cm &/or Wo/cm  Addendum Date: 09/12/2017   ADDENDUM REPORT: 09/12/2017 08:12 EXAM: OVER-READ INTERPRETATION  CT CHEST The following report is an over-read performed by radiologist Dr. Collene Leyden [facility] on 09/12/2017. This over-read does not  include interpretation of cardiac or coronary anatomy or pathology. The coronary CTA interpretation by the cardiologist is attached. COMPARISON:  None. FINDINGS: Heart is normal size. Visualized aorta is normal caliber. No adenopathy in the lower mediastinum or hila. Visualized lungs clear. No effusions. Imaging into the upper abdomen shows no acute findings. Chest wall soft tissues are unremarkable. No acute bony abnormality. IMPRESSION: No acute or significant extracardiac abnormality. Electronically Signed   By: Rolm Baptise M.D.   On: 09/12/2017 08:12   Result Date: 09/12/2017 CLINICAL DATA:  52 year old female with chest pain and bradycardia. EXAM: Cardiac/Coronary  CT TECHNIQUE: The patient was scanned on a [employer]. FINDINGS: A 120 kV prospective scan was triggered in the descending thoracic aorta at 111 HU's. Axial non-contrast 3 mm slices were carried out through the heart. The data set was analyzed on a dedicated work station and scored using the [place]. Gantry rotation speed was 250 msecs and collimation was .6 mm. No beta blockade and 0.8 mg of sl NTG was given. The 3D data set was reconstructed in 5% intervals of the 67-82 % of the R-R cycle. Diastolic phases were analyzed on a dedicated work station using MPR, MIP and VRT modes. The patient received 80 cc of contrast. Aorta:  Normal size.  No calcifications.  No dissection. Aortic Valve:  Trileaflet.  No calcifications. Coronary Arteries:  Normal coronary origin.  Right dominance. RCA is a very large dominant artery that gives rise to PDA and PLVB. There is no plaque. Left main is a large artery that gives rise to LAD, large ramus intermedius and LCX arteries. Left main has no plaque. LAD is a medium size vessel that has no plaque. RI is a large artery that has no plaque. LCX is a non-dominant artery that gives rise to one large OM1 branch. There is no plaque. Other findings: Normal pulmonary vein drainage into the left atrium.  Normal let atrial appendage without a thrombus. IMPRESSION: 1. Coronary calcium score of 0. This was 0 percentile for age and sex matched control. 2. Normal coronary origin with right dominance. 3. No evidence of CAD. 4. Mildly dilated pulmonary artery measuring 30 mm. Electronically Signed: By: Ena Dawley On: 09/11/2017 14:11    Micro Results    Recent Results (from the past 240 hour(s))  MRSA PCR Screening     Status: None   Collection Time: 09/11/17  2:45 AM  Result Value Ref Range Status   MRSA by PCR NEGATIVE NEGATIVE Final    Comment:  The GeneXpert MRSA Assay (FDA approved for NASAL specimens only), is one component of a comprehensive MRSA colonization surveillance program. It is not intended to diagnose MRSA infection nor to guide or monitor treatment for MRSA infections. Performed at [facility], [place] [address], [place], [place] [address]        Today   Subjective    Kimberly Hanson today has no new complaints, No fever  Or chills           Patient has been seen and examined prior to discharge   Objective   Blood pressure 117/72, pulse (!) 51, temperature 98.6 F (37 C), temperature source Oral, resp. rate 16, height 5\' 6"  (1.676 m), weight 105.9 kg (233 lb 6.4 oz), SpO2 97 %.   Intake/Output Summary (Last 24 hours) at 09/12/2017 1556 Last data filed at 09/12/2017 1305 Gross per 24 hour  Intake 118 ml  Output -  Net 118 ml    Exam Gen:- Awake Alert,  In no apparent distress  HEENT:- [place].AT, No sclera icterus Neck-Supple Neck,No JVD,.  Lungs-  CTAB , good air movement CV- S1, S2 normal Abd-  +ve B.Sounds, Abd Soft, No tenderness,    Extremity/Skin:- No  edema,   good pulses Psych-affect is appropriate, oriented x3 Neuro-no new focal deficits, no tremors   Data Review   CBC w Diff:  Lab Results  Component Value Date   WBC 6.0 09/10/2017   HGB 11.0 (L) 09/10/2017   HCT 35.1 (L) 09/10/2017   PLT 223 09/10/2017   LYMPHOPCT 35  03/03/2014   MONOPCT 10 03/03/2014   EOSPCT 1 03/03/2014   BASOPCT 0 03/03/2014    CMP:  Lab Results  Component Value Date   NA 140 09/10/2017   K 3.9 09/10/2017   CL 111 09/10/2017   CO2 23 09/10/2017   BUN 8 09/10/2017   CREATININE 0.74 09/10/2017   PROT 6.8 06/26/2013   ALBUMIN 3.5 06/26/2013   BILITOT <0.2 (L) 06/26/2013   ALKPHOS 67 06/26/2013   AST 17 06/26/2013   ALT 13 06/26/2013  . Total Discharge time is about 33 minutes  Roxan Hockey M.D on 09/12/2017 at 3:56 PM  [facility]   Office  [phone]  Voice Recognition Vivian[employer] dictation system was used to create this note, attempts have been made to correct errors. Please contact the author with questions and/or clarifications.

## 2018-06-11 IMAGING — CT CT HEAD W/O CM
4 series · 17 of 47 positions shown, 19 images · non-contrast
Comparison: None.

CLINICAL DATA: Acute severe headache.

EXAM:
CT HEAD WITHOUT CONTRAST
TECHNIQUE: Contiguous axial images were obtained from the base of the skull
through the vertex without intravenous contrast.

[Series 3: head bone · axial · 0.41mm/px · z∈[-113,-57]mm · 4 of 80 slices shown]
[im 8/80  bone]
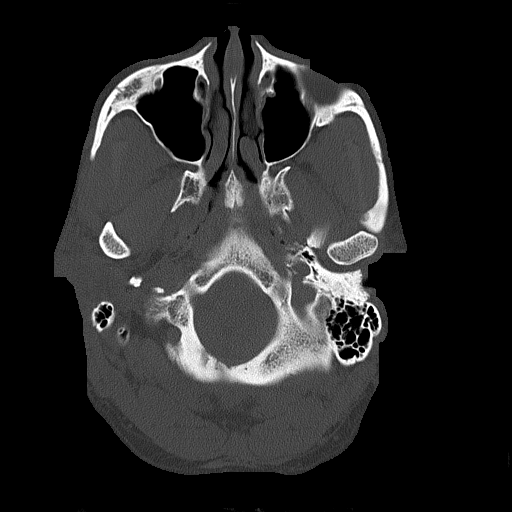
[im 16/80  bone]
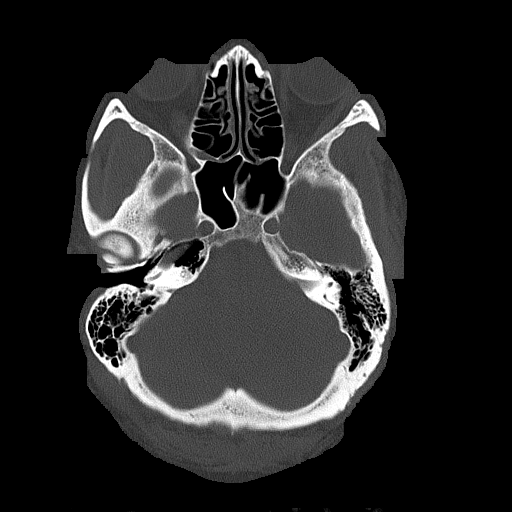
[im 24/80  bone]
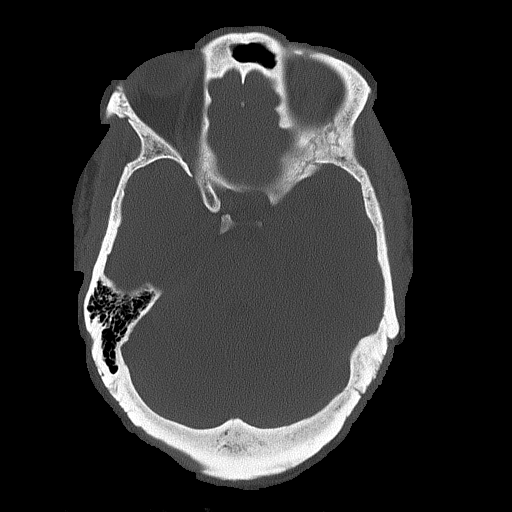
[im 36/80  bone]
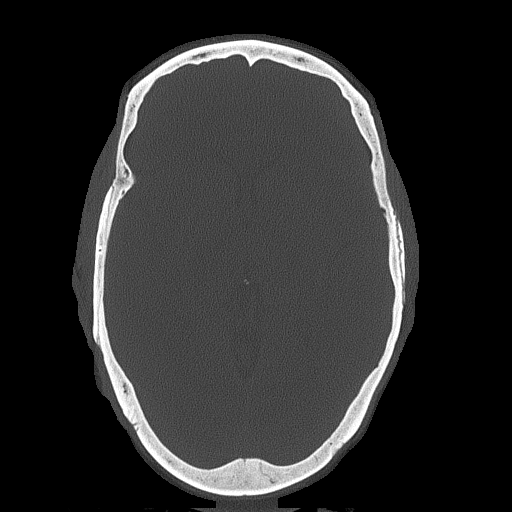

[Series 4: head without · axial · non-contrast · 0.41mm/px · z∈[-112,+8]mm · 7 of 32 slices shown, 9 images]
[im 4/32  brain]
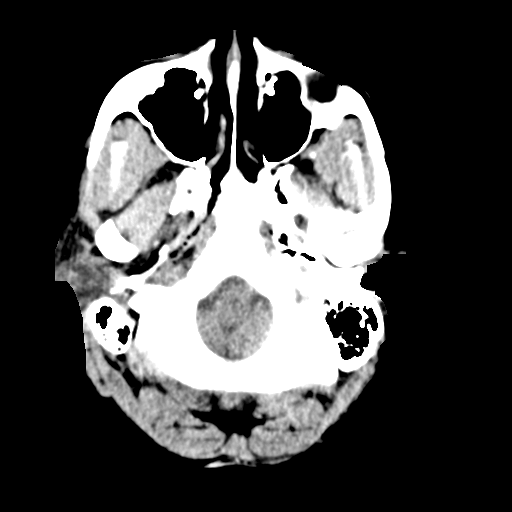
[im 4/32  bone]
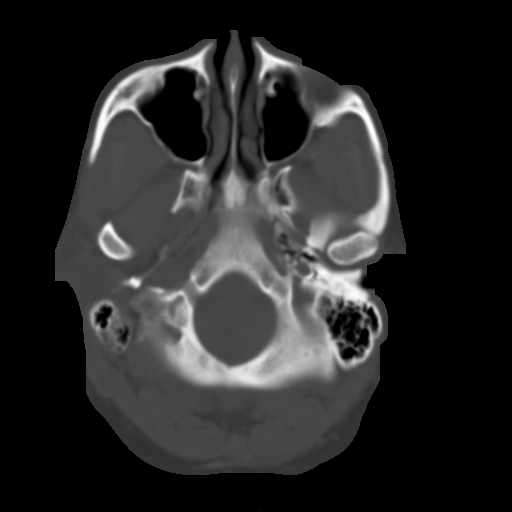
[im 8/32  brain]
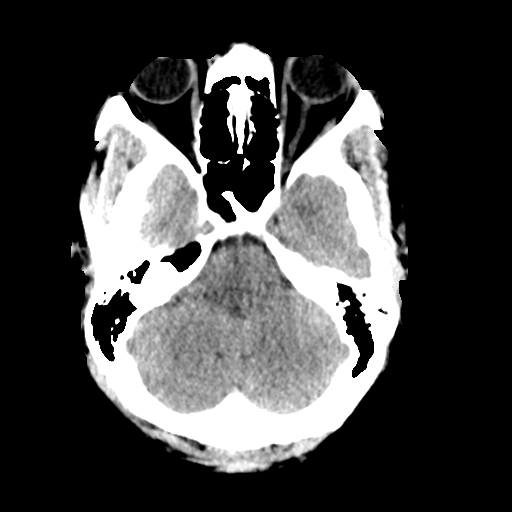
[im 12/32  brain]
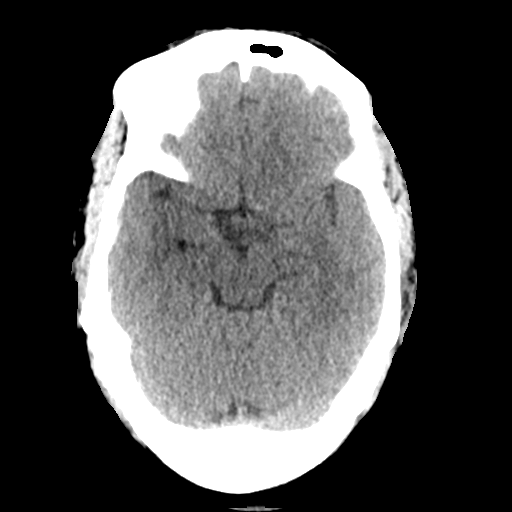
[im 16/32  brain]
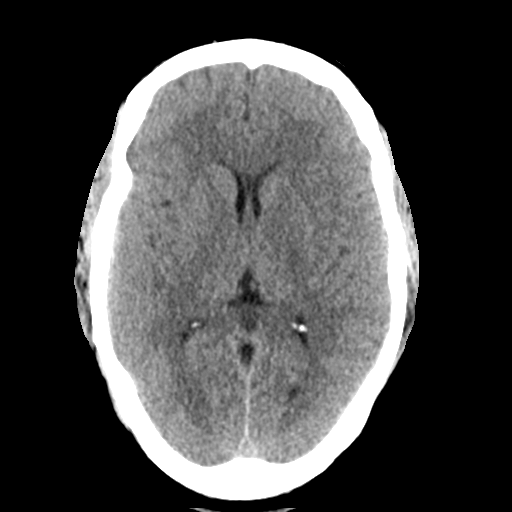
[im 20/32  brain]
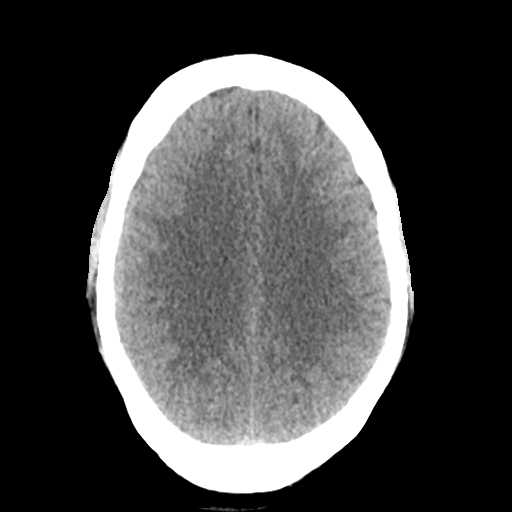
[im 20/32  bone]
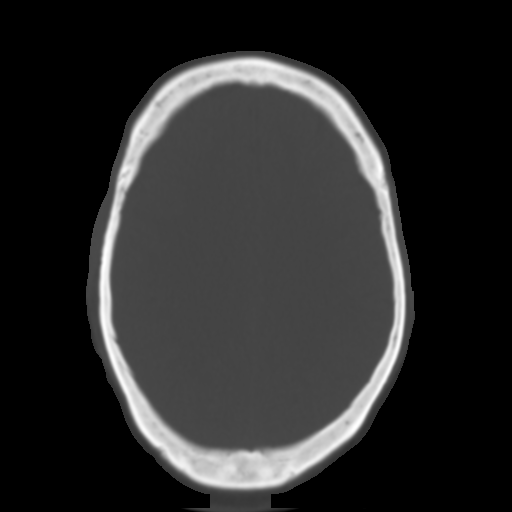
[im 24/32  brain]
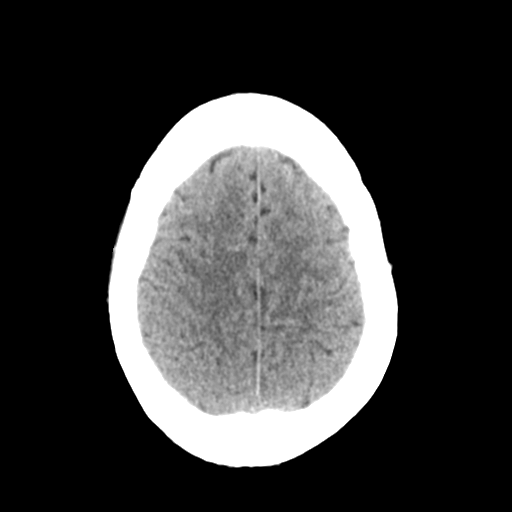
[im 28/32  brain]
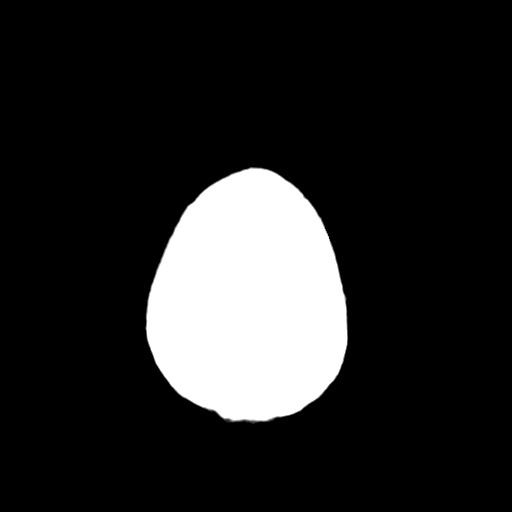

[Series 5: head without cor · coronal · non-contrast · 0.31mm/px · 3 of 67 slices shown]
[im 23/67  brain]
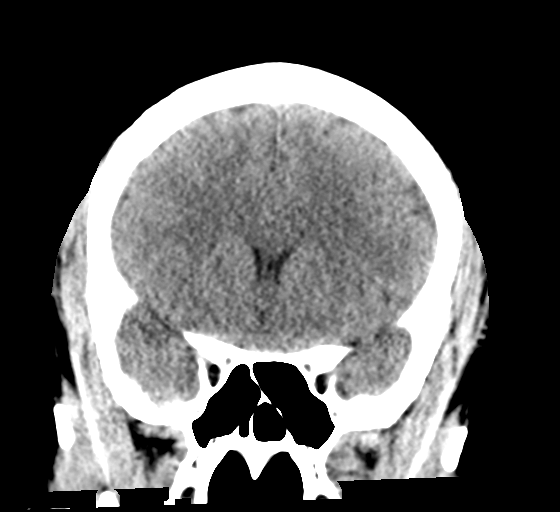
[im 30/67  brain]
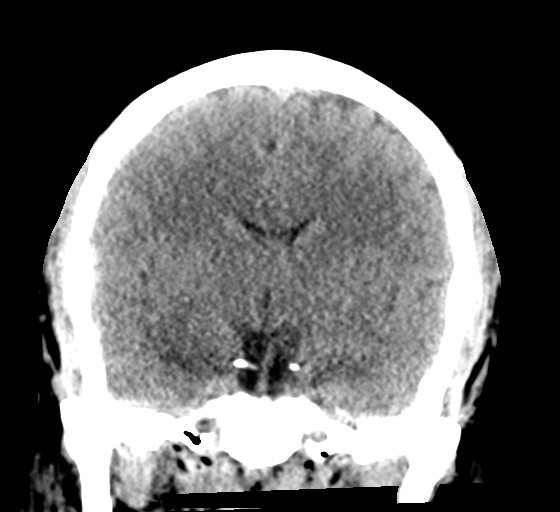
[im 37/67  brain]
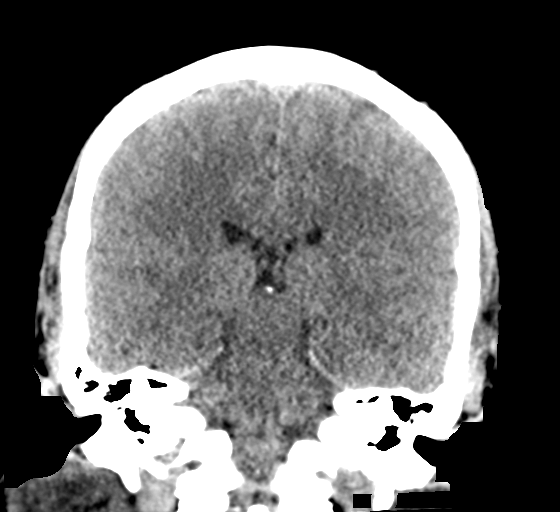

[Series 6: head without sag · sagittal · non-contrast · 0.31mm/px · 3 of 67 slices shown]
[im 23/67  brain]
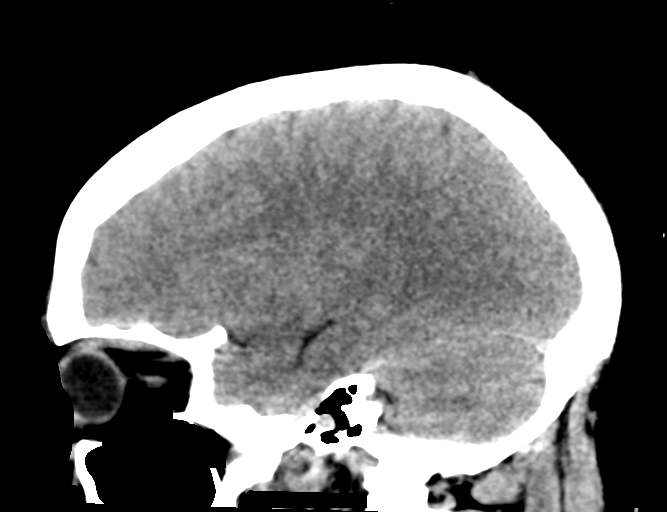
[im 34/67  brain]
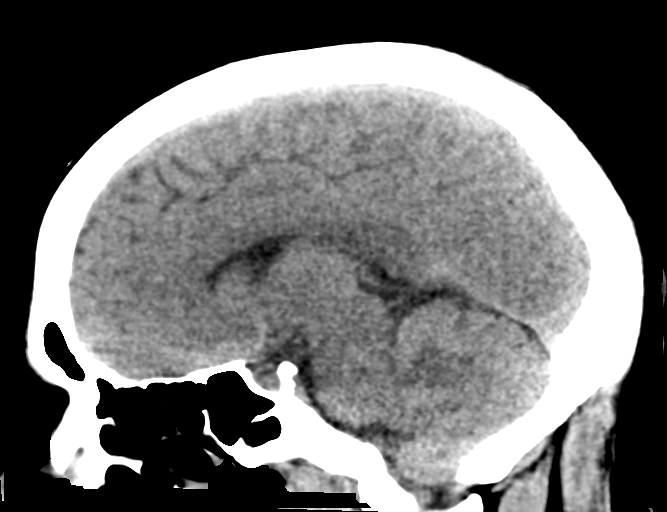
[im 45/67  brain]
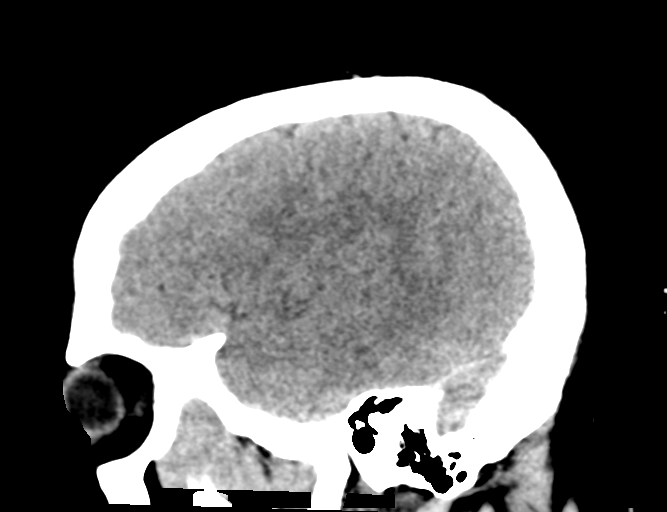

[17 of 47 positions shown; findings below may reference images not displayed]

FINDINGS: Brain: No evidence of acute infarction, hemorrhage, hydrocephalus,
extra-axial collection, or mass lesion/mass effect.

Vascular:  No hyperdense vessel or other acute findings.

Skull: No evidence of fracture or other significant bone
abnormality.

Sinuses/Orbits:  No acute findings.

Other: None.
IMPRESSION: Negative noncontrast head CT.

## 2018-07-05 ENCOUNTER — Ambulatory Visit: Payer: BLUE CROSS/BLUE SHIELD | Admitting: Endocrinology

## 2018-08-02 ENCOUNTER — Telehealth: Payer: Self-pay

## 2018-08-02 NOTE — Telephone Encounter (Signed)
Overdue for an office visit. Called to schedule an appt. Unable to reach d/t no VM or answer.

## 2018-08-08 NOTE — Telephone Encounter (Signed)
SECOND ATTEMPT. Called pt to attempt to schedule appt. Still no answer. Letter composed and sent through [place]

## 2018-10-02 ENCOUNTER — Other Ambulatory Visit: Payer: Self-pay | Admitting: Endocrinology

## 2019-03-25 ENCOUNTER — Ambulatory Visit (HOSPITAL_COMMUNITY)
Admission: EM | Admit: 2019-03-25 | Discharge: 2019-03-25 | Disposition: A | Discharge status: Home / Self Care | Payer: BLUE CROSS/BLUE SHIELD | Attending: Family Medicine | Admitting: Family Medicine

## 2019-03-25 ENCOUNTER — Ambulatory Visit (INDEPENDENT_AMBULATORY_CARE_PROVIDER_SITE_OTHER): Payer: BLUE CROSS/BLUE SHIELD

## 2019-03-25 ENCOUNTER — Other Ambulatory Visit: Payer: Self-pay

## 2019-03-25 DIAGNOSIS — W108XXA Fall (on) (from) other stairs and steps, initial encounter: Secondary | ICD-10-CM | POA: Diagnosis not present

## 2019-03-25 DIAGNOSIS — W109XXA Fall (on) (from) unspecified stairs and steps, initial encounter: Secondary | ICD-10-CM | POA: Diagnosis not present

## 2019-03-25 DIAGNOSIS — S93491A Sprain of other ligament of right ankle, initial encounter: Secondary | ICD-10-CM

## 2019-03-25 NOTE — Discharge Instructions (Signed)
Take tylenol for pain Ice and elevate to reduce pain and swelling Limit walking while painful

## 2019-03-25 NOTE — ED Provider Notes (Signed)
[place]    CSN: [id number] Arrival date & time: 03/25/19  1124      History   Chief Complaint Chief Complaint  Patient presents with  . Fall  . Ankle Pain    HPI Kimberly Hanson is a 53 y.o. female.   HPI  Patient fell down stairs 2 days ago.  Pain in her right ankle.  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  Continued ankle pain.  Pain with weightbearing.  Continued ankle swelling.  Past Medical History:  Diagnosis Date  . Cholelithiasis    On CT 06/26/13  . Diverticulosis    On CT 06/26/13  . Hyperthyroidism   . Multinodular goiter   . Seizures [facility])     Patient Active Problem List   Diagnosis Date Noted  . Chest pain   . Bradycardia 09/10/2017  . Chest pressure 09/10/2017  . Lumbar radiculopathy 03/22/2014  . Greater trochanteric bursitis of left hip 03/22/2014  . Benign neoplasm of colon 07/04/2013  . Diverticulosis of colon (without mention of hemorrhage) 07/04/2013  . External hemorrhoids without mention of complication 123XX123  . Hypothyroidism following radioiodine therapy 06/08/2013  . Unspecified constipation 09/19/2012  . Sciatica neuralgia 09/06/2012  . Multinodular goiter 07/13/2012    Past Surgical History:  Procedure Laterality Date  . ABDOMINAL HYSTERECTOMY  2002  . CESAREAN SECTION     x 2  . COLONOSCOPY N/A 07/04/2013   Procedure: COLONOSCOPY;  Surgeon: Milus Banister, MD;  Location: [facility];  Service: Endoscopy;  Laterality: N/A;    OB History   No obstetric history on file.      Home Medications    Prior to Admission medications   Medication Sig Start Date End Date Taking? Authorizing Provider  acetaminophen (TYLENOL) 500 MG tablet Take 1,000 mg by mouth every 6 (six) hours as needed for mild pain.    [provider]  albuterol (PROVENTIL HFA;VENTOLIN HFA) 108 (90 BASE) MCG/ACT inhaler Inhale 1-2 puffs into the lungs every 6 (six) hours as needed for wheezing  or shortness of breath. 03/03/14   Rolene Course, PA-C  levothyroxine (SYNTHROID, LEVOTHROID) 200 MCG tablet Take 1 tablet (200 mcg total) by mouth daily before breakfast. 07/04/17   Renato Shin, MD  Multiple Vitamin (MULTIVITAMIN WITH MINERALS) TABS tablet Take 1 tablet by mouth daily.    [provider]  zolpidem (AMBIEN) 5 MG tablet Take 5 mg by mouth at bedtime as needed for sleep.  02/16/16   [provider]    Family History Family History  Problem Relation Age of Onset  . Hypertension Mother   . Colon polyps Mother   . Heart attack Mother   . Colon cancer Neg Hx     Social History Social History   Tobacco Use  . Smoking status: Current Every Day Smoker    Packs/day: 0.50    Years: 5.00    Pack years: 2.50    Types: Cigarettes  . Smokeless tobacco: Never Used  . Tobacco comment: tobacco info given 06/29/13  Substance Use Topics  . Alcohol use: Yes    Comment: occasional  . Drug use: No     Allergies   Naproxen   Review of Systems Review of Systems  Constitutional: Negative for chills and fever.  HENT: Negative for congestion and hearing loss.   Eyes: Negative for pain.  Respiratory: Negative for cough and shortness of breath.   Cardiovascular: Negative for  chest pain and leg swelling.  Gastrointestinal: Negative for abdominal pain, constipation and diarrhea.  Genitourinary: Negative for dysuria and frequency.  Musculoskeletal: Positive for arthralgias and gait problem. Negative for myalgias.  Neurological: Negative for dizziness, seizures and headaches.  Psychiatric/Behavioral: The patient is not nervous/anxious.      Physical Exam Triage Vital Signs ED Triage Vitals  Enc Vitals Group     BP 03/25/19 1206 117/88     Pulse Rate 03/25/19 1206 77     Resp 03/25/19 1206 18     Temp 03/25/19 1206 98.5 F (36.9 C)     Temp Source 03/25/19 1206 Oral     SpO2 03/25/19 1206 100 %     Weight --      Height --      Head Circumference --        Peak Flow --      Pain Score 03/25/19 1350 0     Pain Loc --      Pain Edu? --      Excl. in [place]? --    No data found.  Updated Vital Signs BP 117/88 (BP Location: Right Arm)   Pulse 77   Temp 98.5 F (36.9 C) (Oral)   Resp 18   SpO2 100%      Physical Exam Constitutional:      General: She is not in acute distress.    Appearance: She is well-developed. She is obese.  HENT:     Head: Normocephalic and atraumatic.  Eyes:     Conjunctiva/sclera: Conjunctivae normal.     Pupils: Pupils are equal, round, and reactive to light.  Cardiovascular:     Rate and Rhythm: Normal rate.  Pulmonary:     Effort: Pulmonary effort is normal. No respiratory distress.  Abdominal:     General: There is no distension.     Palpations: Abdomen is soft.  Musculoskeletal:        General: Normal range of motion.     Cervical back: Normal range of motion.     Comments: Right ankle has tenderness over the lateral malleolus.  Swelling laterally.  Full range of motion.  No instability.  Skin:    General: Skin is warm and dry.  Neurological:     Mental Status: She is alert.     Gait: Gait abnormal.  Psychiatric:        Mood and Affect: Mood normal.        Behavior: Behavior normal.      UC Treatments / Results  Labs (all labs ordered are listed, but only abnormal results are displayed) Labs Reviewed - No data to display  EKG   Radiology DG Ankle Complete Right  Result Date: 03/25/2019 CLINICAL DATA:  Right ankle pain after fall 2 days ago. EXAM: RIGHT ANKLE - COMPLETE 3+ VIEW COMPARISON:  None. FINDINGS: No acute fracture or dislocation. The ankle mortise is symmetric. The talar dome is intact. Small tibiotalar joint effusion. Joint spaces are preserved. Bone mineralization is normal. Soft tissue swelling over the lateral malleolus. IMPRESSION: 1. Soft tissue swelling over lateral malleolus. No acute osseous abnormality. Electronically Signed   By: Titus Dubin M.D.   On:  03/25/2019 13:02    Procedures Procedures (including critical care time)  Medications Ordered in UC Medications - No data to display  Initial Impression / Assessment and Plan / UC Course  I have reviewed the triage vital signs and the nursing notes.  Pertinent labs & imaging results that  were available during my care of the patient were reviewed by me and considered in my medical decision making (see chart for details).     Ankle sprain discussed Final Clinical Impressions(s) / UC Diagnoses   Final diagnoses:  Sprain of anterior talofibular ligament of right ankle, initial encounter     Discharge Instructions     Take tylenol for pain Ice and elevate to reduce pain and swelling Limit walking while painful    ED Prescriptions    None     PDMP not reviewed this encounter.   Raylene Everts, MD 03/25/19 1901

## 2019-03-25 NOTE — ED Triage Notes (Signed)
Pt she fell the down stairs 2 days ago. Pt states she has right ankle pain.

## 2019-05-01 ENCOUNTER — Ambulatory Visit: Payer: BLUE CROSS/BLUE SHIELD

## 2019-05-08 ENCOUNTER — Other Ambulatory Visit: Payer: Self-pay

## 2019-05-08 ENCOUNTER — Ambulatory Visit: Payer: 59 | Attending: Physician Assistant

## 2019-05-08 DIAGNOSIS — R262 Difficulty in walking, not elsewhere classified: Secondary | ICD-10-CM | POA: Insufficient documentation

## 2019-05-08 DIAGNOSIS — R600 Localized edema: Secondary | ICD-10-CM | POA: Insufficient documentation

## 2019-05-08 DIAGNOSIS — M25671 Stiffness of right ankle, not elsewhere classified: Secondary | ICD-10-CM | POA: Insufficient documentation

## 2019-05-08 DIAGNOSIS — M25571 Pain in right ankle and joints of right foot: Secondary | ICD-10-CM | POA: Insufficient documentation

## 2019-05-08 NOTE — Therapy (Signed)
[place], [place], [address] Phone: [phone]   Fax:  [phone]  Physical Therapy Evaluation  Patient Details  Name: Kimberly Hanson MRN: [id number] Date of Birth: 11-19-1965 Referring Provider (PT): Fae Pippin, [place]   Encounter Date: 05/08/2019  PT End of Session - 05/08/19 1400    Visit Number  1    Number of Visits  12    Date for PT Re-Evaluation  06/15/19    Authorization Type  [employer]    PT Start Time  0200    PT Stop Time  0240    PT Time Calculation (min)  40 min    Activity Tolerance  Patient tolerated treatment well    Behavior During Therapy  [facility] for tasks assessed/performed       Past Medical History:  Diagnosis Date  . Cholelithiasis    On CT 06/26/13  . Diverticulosis    On CT 06/26/13  . Hyperthyroidism   . Multinodular goiter   . Seizures ([place])     Past Surgical History:  Procedure Laterality Date  . ABDOMINAL HYSTERECTOMY  2002  . CESAREAN SECTION     x 2  . COLONOSCOPY N/A 07/04/2013   Procedure: COLONOSCOPY;  Surgeon: Milus Banister, MD;  Location: [facility];  Service: Endoscopy;  Laterality: N/A;    There were no vitals filed for this visit.   Subjective Assessment - 05/08/19 1406    Subjective  She reports RT ankle sprain with fall down steps.       Fall was before Christmas    Pertinent History  lumbar laminectomy    Limitations  Standing;Walking    How long can you stand comfortably?  3-4 min    How long can you walk comfortably?  No distance but can walk a block. LT leg goes numb    Diagnostic tests  xray: sprain    Patient Stated Goals  She wants it to return to normal , improve strength and ROM    Currently in Pain?  Yes    Pain Location  Ankle    Pain Orientation  Right;Anterior;Posterior;Lateral    Pain Descriptors / Indicators  Sharp    Pain Type  Acute pain    Pain Onset  More than a month ago    Pain Frequency  Constant    Aggravating  Factors   weight bearing    Pain Relieving Factors  meds , ice / elevation         [facility] PT Assessment - 05/08/19 0001      Assessment   Medical Diagnosis  RT ankle sprain    Referring Provider (PT)  Fae Pippin, PA    Onset Date/Surgical Date  03/23/19    Next MD Visit  Next week    Prior Therapy  no      Precautions   Precautions  None      Restrictions   Weight Bearing Restrictions  No      Balance Screen   Has the patient fallen in the past 6 months  Yes    How many times?  1    Has the patient had a decrease in activity level because of a fear of falling?   Yes    Is the patient reluctant to leave their home because of a fear of falling?   No      Home Film/video editor residence    Living Arrangements  Children    Type of Home  House    Home Access  Level entry    Home Layout  Two level    Alternate Level Stairs-Number of Steps  12    Alternate Level Stairs-Rails  Right      Prior Function   Level of Independence  Needs assistance with homemaking;Needs assistance with ADLs    Vocation  Full time employment    Vocation Requirements  Sits most of time      Cognition   Overall Cognitive Status  Within Functional Limits for tasks assessed      Observation/Other Assessments   Focus on Therapeutic Outcomes (FOTO)   57% limited      Observation/Other Assessments-Edema    Edema  Figure 8      Figure 8 Edema   Figure 8 - Right   55 cm      ROM / Strength   AROM / PROM / Strength  AROM;PROM;Strength      AROM   AROM Assessment Site  Ankle    Right/Left Ankle  Left;Right    Right Ankle Dorsiflexion  90    Right Ankle Plantar Flexion  40    Right Ankle Inversion  20    Right Ankle Eversion  10    Left Ankle Dorsiflexion  105    Left Ankle Plantar Flexion  63    Left Ankle Inversion  45    Left Ankle Eversion  10      Strength   Overall Strength Comments  4-/5 due to pain                Objective measurements  completed on examination: See above findings.              PT Education - 05/08/19 1404    Education Details  HEP   POC    Person(s) Educated  Patient    Methods  Explanation;Demonstration;Verbal cues;Handout;Tactile cues    Comprehension  Verbalized understanding;Returned demonstration       PT Short Term Goals - 05/08/19 1453      PT SHORT TERM GOAL #4   Title  She will improve AROM by 10 degrees in ones with limited ROM    Time  3    Period  Weeks    Status  New        PT Long Term Goals - 05/08/19 1405      PT LONG TERM GOAL #1   Title  She will be independent with all HEp    Time  6    Period  Weeks    Status  New      PT LONG TERM GOAL #2   Title  She will be able to walk with no limp with normal foot wear    Time  6    Period  Weeks    Status  New      PT LONG TERM GOAL #3   Title  FOTO score will improve to 40% limited or better    Time  6    Period  Weeks    Status  New             Plan - 05/08/19 1414    Clinical Impression Statement  [name] present post RT ankle sprain with swelling stiffness and pain limitng activity on feet. She has been working on her ROM at home and continues to ice and elevatebut is doing this less She reports has  less swelling  per her report.   She should do well with skilled PT and consistent HEP.    Examination-Activity Limitations  Locomotion Level;Squat;Stairs    Examination-Participation Restrictions  Laundry;Meal Prep;Cleaning;Community Activity    Stability/Clinical Decision Making  Stable/Uncomplicated    Clinical Decision Making  Low    Rehab Potential  Good    PT Frequency  2x / week    PT Duration  6 weeks    PT Treatment/Interventions  Taping;Cryotherapy;Iontophoresis 4mg /ml Dexamethasone;Electrical Stimulation;Stair training;Gait training;Therapeutic activities;Therapeutic exercise;Balance training;Patient/family education;Manual lymph drainage;Manual techniques;Passive range of motion    PT Next  Visit Plan  STW, Manual , modalities, suggest contrast bath.    PT Home Exercise Plan  towel scruches and marble pick ups    Consulted and Agree with Plan of Care  Patient       Patient will benefit from skilled therapeutic intervention in order to improve the following deficits and impairments:  Decreased range of motion, Difficulty walking, Pain, Decreased strength, Decreased activity tolerance, Increased edema  Visit Diagnosis: Pain in right ankle and joints of right foot - Plan: PT plan of care cert/re-cert  Stiffness of right ankle, not elsewhere classified - Plan: PT plan of care cert/re-cert  Localized edema - Plan: PT plan of care cert/re-cert  Difficulty in walking, not elsewhere classified - Plan: PT plan of care cert/re-cert     Problem List Patient Active Problem List   Diagnosis Date Noted  . Chest pain   . Bradycardia 09/10/2017  . Chest pressure 09/10/2017  . Lumbar radiculopathy 03/22/2014  . Greater trochanteric bursitis of left hip 03/22/2014  . Benign neoplasm of colon 07/04/2013  . Diverticulosis of colon (without mention of hemorrhage) 07/04/2013  . External hemorrhoids without mention of complication 123XX123  . Hypothyroidism following radioiodine therapy 06/08/2013  . Unspecified constipation 09/19/2012  . Sciatica neuralgia 09/06/2012  . Multinodular goiter 07/13/2012    Darrel Hoover  PT 05/08/2019, 2:55 PM  [facility] [address] [place], [place], [address] Phone: [phone]   Fax:  [phone]  Name: Kimberly Hanson MRN: [id number] Date of Birth: 1965/05/08

## 2019-05-08 NOTE — Patient Instructions (Signed)
Towel scrunches and marble pick up 20-30 reps 2x/day.  Ice and elevate 2x/day

## 2019-05-11 ENCOUNTER — Other Ambulatory Visit: Payer: Self-pay

## 2019-05-11 ENCOUNTER — Emergency Department (HOSPITAL_COMMUNITY)
Admission: EM | Admit: 2019-05-11 | Discharge: 2019-05-12 | Disposition: A | Discharge status: Home / Self Care | Payer: 59 | Attending: Emergency Medicine | Admitting: Emergency Medicine

## 2019-05-11 DIAGNOSIS — D126 Benign neoplasm of colon, unspecified: Secondary | ICD-10-CM | POA: Diagnosis not present

## 2019-05-11 DIAGNOSIS — N3001 Acute cystitis with hematuria: Secondary | ICD-10-CM | POA: Insufficient documentation

## 2019-05-11 DIAGNOSIS — R42 Dizziness and giddiness: Secondary | ICD-10-CM | POA: Diagnosis not present

## 2019-05-11 DIAGNOSIS — E890 Postprocedural hypothyroidism: Secondary | ICD-10-CM | POA: Insufficient documentation

## 2019-05-11 DIAGNOSIS — Z79899 Other long term (current) drug therapy: Secondary | ICD-10-CM | POA: Insufficient documentation

## 2019-05-11 DIAGNOSIS — F17210 Nicotine dependence, cigarettes, uncomplicated: Secondary | ICD-10-CM | POA: Diagnosis not present

## 2019-05-11 DIAGNOSIS — R112 Nausea with vomiting, unspecified: Secondary | ICD-10-CM | POA: Insufficient documentation

## 2019-05-11 LAB — CBC
HCT: 41.3 % (ref 36.0–46.0)
Hemoglobin: 14.1 g/dL (ref 12.0–15.0)
MCH: 32.6 pg (ref 26.0–34.0)
MCHC: 34.1 g/dL (ref 30.0–36.0)
MCV: 95.6 fL (ref 80.0–100.0)
Platelets: 214 10*3/uL (ref 150–400)
RBC: 4.32 MIL/uL (ref 3.87–5.11)
RDW: 14 % (ref 11.5–15.5)
WBC: 9.8 10*3/uL (ref 4.0–10.5)
nRBC: 0 % (ref 0.0–0.2)

## 2019-05-11 LAB — URINALYSIS, ROUTINE W REFLEX MICROSCOPIC
Bilirubin Urine: NEGATIVE
Glucose, UA: NEGATIVE mg/dL
Hgb urine dipstick: NEGATIVE
Ketones, ur: NEGATIVE mg/dL
Nitrite: NEGATIVE
Protein, ur: 30 mg/dL — AB
Specific Gravity, Urine: 1.028 (ref 1.005–1.030)
pH: 5 (ref 5.0–8.0)

## 2019-05-11 LAB — COMPREHENSIVE METABOLIC PANEL
ALT: 24 U/L (ref 0–44)
AST: 34 U/L (ref 15–41)
Albumin: 4.2 g/dL (ref 3.5–5.0)
Alkaline Phosphatase: 72 U/L (ref 38–126)
Anion gap: 15 (ref 5–15)
BUN: 13 mg/dL (ref 6–20)
CO2: 22 mmol/L (ref 22–32)
Calcium: 10 mg/dL (ref 8.9–10.3)
Chloride: 102 mmol/L (ref 98–111)
Creatinine, Ser: 1.14 mg/dL — ABNORMAL HIGH (ref 0.44–1.00)
GFR calc Af Amer: >60 mL/min (ref >60)
GFR calc non Af Amer: 55 mL/min — ABNORMAL LOW (ref >60)
Glucose, Bld: 120 mg/dL — ABNORMAL HIGH (ref 70–99)
Potassium: 3.5 mmol/L (ref 3.5–5.1)
Sodium: 139 mmol/L (ref 135–145)
Total Bilirubin: 0.9 mg/dL (ref 0.3–1.2)
Total Protein: 7.5 g/dL (ref 6.5–8.1)

## 2019-05-11 LAB — LIPASE, BLOOD: Lipase: 13 U/L (ref 11–51)

## 2019-05-11 NOTE — ED Triage Notes (Addendum)
Pt c/o left flank pain and N/V x 3 days. States unable to keep anything down. As she was leaving triage, states that she has had rectal bleeding as well.

## 2019-05-12 LAB — URINALYSIS, ROUTINE W REFLEX MICROSCOPIC
Bilirubin Urine: NEGATIVE
Glucose, UA: NEGATIVE mg/dL
Hgb urine dipstick: NEGATIVE
Ketones, ur: NEGATIVE mg/dL
Nitrite: NEGATIVE
Protein, ur: 30 mg/dL — AB
Specific Gravity, Urine: 1.031 — ABNORMAL HIGH (ref 1.005–1.030)
pH: 5 (ref 5.0–8.0)

## 2019-05-12 MED ORDER — LIDOCAINE VISCOUS HCL 2 % MT SOLN
15.0000 mL | Freq: Once | OROMUCOSAL | Status: AC
  Administered 2019-05-12: 15 mL via ORAL
  Filled 2019-05-12: qty 15

## 2019-05-12 MED ORDER — ALUM & MAG HYDROXIDE-SIMETH 200-200-20 MG/5ML PO SUSP
30.0000 mL | Freq: Once | ORAL | Status: AC
  Administered 2019-05-12: 30 mL via ORAL
  Filled 2019-05-12: qty 30

## 2019-05-12 MED ORDER — CEPHALEXIN 500 MG PO CAPS: 500.0000 mg | ORAL_CAPSULE | Freq: Three times a day (TID) | ORAL | 0 refills | Status: AC

## 2019-05-12 MED ORDER — ONDANSETRON HCL 4 MG/2ML IJ SOLN
4.0000 mg | Freq: Once | INTRAMUSCULAR | Status: AC
  Administered 2019-05-12: 4 mg via INTRAVENOUS
  Filled 2019-05-12: qty 2

## 2019-05-12 MED ORDER — ONDANSETRON 4 MG PO TBDP: 4.0000 mg | ORAL_TABLET | Freq: Three times a day (TID) | ORAL | 0 refills | Status: AC | PRN

## 2019-05-12 MED ORDER — SODIUM CHLORIDE 0.9 % IV BOLUS
1000.0000 mL | Freq: Once | INTRAVENOUS | Status: AC
  Administered 2019-05-12: 1000 mL via INTRAVENOUS

## 2019-05-12 NOTE — ED Provider Notes (Signed)
[facility] EMERGENCY DEPARTMENT Provider Note  CSN: [id number] Arrival date & time: 05/11/19 2138  Chief Complaint(s) Emesis  HPI Kimberly Hanson is a 54 y.o. female who presents to the emergency department with 3 days of left upper quadrant/left flank abdominal pain with associated nausea and nonbloody nonbilious emesis.  Patient denies any fevers.  She endorses chills with the emesis.  Endorses generalized fatigue and dehydration.  Lightheadedness as well.  No chest pain or shortness of breath.  No diarrhea.  No urinary symptoms.  No suspicious food intake.  Endorsed social marijuana use and alcohol use.  None recent.  HPI  Past Medical History Past Medical History:  Diagnosis Date  . Cholelithiasis    On CT 06/26/13  . Diverticulosis    On CT 06/26/13  . Hyperthyroidism   . Multinodular goiter   . Seizures [facility])    Patient Active Problem List   Diagnosis Date Noted  . Chest pain   . Bradycardia 09/10/2017  . Chest pressure 09/10/2017  . Lumbar radiculopathy 03/22/2014  . Greater trochanteric bursitis of left hip 03/22/2014  . Benign neoplasm of colon 07/04/2013  . Diverticulosis of colon (without mention of hemorrhage) 07/04/2013  . External hemorrhoids without mention of complication 123XX123  . Hypothyroidism following radioiodine therapy 06/08/2013  . Unspecified constipation 09/19/2012  . Sciatica neuralgia 09/06/2012  . Multinodular goiter 07/13/2012   Home Medication(s) Prior to Admission medications   Medication Sig Start Date End Date Taking? Authorizing Provider  acetaminophen (TYLENOL) 500 MG tablet Take 1,000 mg by mouth every 6 (six) hours as needed for mild pain.   Yes [provider]  albuterol (PROVENTIL HFA;VENTOLIN HFA) 108 (90 BASE) MCG/ACT inhaler Inhale 1-2 puffs into the lungs every 6 (six) hours as needed for wheezing or shortness of breath. 03/03/14  Yes Rolene Course, PA-C  levothyroxine (SYNTHROID, LEVOTHROID) 200  MCG tablet Take 1 tablet (200 mcg total) by mouth daily before breakfast. 07/04/17  Yes Renato Shin, MD  Multiple Vitamin (MULTIVITAMIN WITH MINERALS) TABS tablet Take 1 tablet by mouth daily.   Yes [provider]  oxyCODONE (OXY IR/ROXICODONE) 5 MG immediate release tablet Take 5 mg by mouth 3 (three) times daily as needed for moderate pain.  05/04/19  Yes [provider]  cephALEXin (KEFLEX) 500 MG capsule Take 1 capsule (500 mg total) by mouth 3 (three) times daily for 10 days. 05/12/19 05/22/19  Fatima Blank, MD  ondansetron (ZOFRAN ODT) 4 MG disintegrating tablet Take 1 tablet (4 mg total) by mouth every 8 (eight) hours as needed for up to 3 days for nausea or vomiting. 05/12/19 05/15/19  Fatima Blank, MD                                                                                                                                    Past Surgical History Past Surgical History:  Procedure Laterality Date  .  ABDOMINAL HYSTERECTOMY  2002  . CESAREAN SECTION     x 2  . COLONOSCOPY N/A 07/04/2013   Procedure: COLONOSCOPY;  Surgeon: Milus Banister, MD;  Location: [facility];  Service: Endoscopy;  Laterality: N/A;   Family History Family History  Problem Relation Age of Onset  . Hypertension Mother   . Colon polyps Mother   . Heart attack Mother   . Colon cancer Neg Hx     Social History Social History   Tobacco Use  . Smoking status: Current Every Day Smoker    Packs/day: 0.50    Years: 5.00    Pack years: 2.50    Types: Cigarettes  . Smokeless tobacco: Never Used  . Tobacco comment: tobacco info given 06/29/13  Substance Use Topics  . Alcohol use: Yes    Comment: occasional  . Drug use: No   Allergies Naproxen  Review of Systems Review of Systems All other systems are reviewed and are negative for acute change except as noted in the HPI  Physical Exam Vital Signs  I have reviewed the triage vital signs BP 120/85 (BP Location: Right  Arm)   Pulse (!) 41   Temp 98 F (36.7 C) (Oral)   Resp 13   Ht 5\' 6"  (1.676 m)   Wt 98.4 kg   SpO2 99%   BMI 35.02 kg/m   Physical Exam Vitals reviewed.  Constitutional:      General: She is not in acute distress.    Appearance: She is well-developed. She is not diaphoretic.  HENT:     Head: Normocephalic and atraumatic.     Right Ear: External ear normal.     Left Ear: External ear normal.     Nose: Nose normal.  Eyes:     General: No scleral icterus.    Conjunctiva/sclera: Conjunctivae normal.  Neck:     Trachea: Phonation normal.  Cardiovascular:     Rate and Rhythm: Normal rate and regular rhythm.  Pulmonary:     Effort: Pulmonary effort is normal. No respiratory distress.     Breath sounds: No stridor.  Abdominal:     General: There is no distension.     Tenderness: There is abdominal tenderness in the left upper quadrant. There is left CVA tenderness. There is no guarding or rebound.  Musculoskeletal:        General: Normal range of motion.     Cervical back: Normal range of motion.  Neurological:     Mental Status: She is alert and oriented to person, place, and time.  Psychiatric:        Behavior: Behavior normal.     ED Results and Treatments Labs (all labs ordered are listed, but only abnormal results are displayed) Labs Reviewed  COMPREHENSIVE METABOLIC PANEL - Abnormal; Notable for the following components:      Result Value   Glucose, Bld 120 (*)    Creatinine, Ser 1.14 (*)    GFR calc non Af Amer 55 (*)    All other components within normal limits  URINALYSIS, ROUTINE W REFLEX MICROSCOPIC - Abnormal; Notable for the following components:   Color, Urine AMBER (*)    APPearance CLOUDY (*)    Protein, ur 30 (*)    Leukocytes,Ua TRACE (*)    Bacteria, UA FEW (*)    All other components within normal limits  URINALYSIS, ROUTINE W REFLEX MICROSCOPIC - Abnormal; Notable for the following components:   Color, Urine AMBER (*)    APPearance  CLOUDY  (*)    Specific Gravity, Urine 1.031 (*)    Protein, ur 30 (*)    Leukocytes,Ua TRACE (*)    Bacteria, UA FEW (*)    All other components within normal limits  URINE CULTURE  LIPASE, BLOOD  CBC                                                                                                                         EKG  EKG Interpretation  Date/Time:  Saturday May 12 2019 02:11:35 EST Ventricular Rate:  43 PR Interval:    QRS Duration: 89 QT Interval:  437 QTC Calculation: 370 R Axis:   56 Text Interpretation: Sinus bradycardia Anterior infarct, old No significant change since last tracing Confirmed by Addison Lank [phone]) on 05/12/2019 3:45:09 AM      Radiology No results found.  Pertinent labs & imaging results that were available during my care of the patient were reviewed by me and considered in my medical decision making (see chart for details).  Medications Ordered in ED Medications  sodium chloride 0.9 % bolus 1,000 mL (1,000 mLs Intravenous New Bag/Given 05/12/19 0308)  ondansetron (ZOFRAN) injection 4 mg (4 mg Intravenous Given 05/12/19 0310)  alum & mag hydroxide-simeth (MAALOX/MYLANTA) 200-200-20 MG/5ML suspension 30 mL (30 mLs Oral Given 05/12/19 0310)    And  lidocaine (XYLOCAINE) 2 % viscous mouth solution 15 mL (15 mLs Oral Given 05/12/19 0310)                                                                                                                                    Procedures Procedures  (including critical care time)  Medical Decision Making / ED Course I have reviewed the nursing notes for this encounter and the patient's prior records (if available in EHR or on provided paperwork).   Mati Griswell was evaluated in Emergency Department on 05/12/2019 for the symptoms described in the history of present illness. She was evaluated in the context of the global COVID-19 pandemic, which necessitated consideration that the patient might be at risk for infection  with the SARS-CoV-2 virus that causes COVID-19. Institutional protocols and algorithms that pertain to the evaluation of patients at risk for COVID-19 are in a state of rapid change based on information released by regulatory bodies including the CDC and federal and state organizations. These policies and algorithms were followed during the  patient's care in the ED.  Work-up without leukocytosis or anemia.  No significant electrolyte derangements or renal sufficiency.  No evidence of bili obstruction or pancreatitis.  UA suspicious for possible infection.  Patient treated symptomatically with nausea medicine IV fluids which provided complete resolution.  Patient able to tolerate oral intake.  Possible cyclical vomiting versus gastritis versus cannabinoid hyperemesis versus early pyelonephritis.  Patient is not septic.  Feel she is appropriate for outpatient management.  The patient appears reasonably screened and/or stabilized for discharge and I doubt any other medical condition or other [facility] requiring further screening, evaluation, or treatment in the ED at this time prior to discharge.  The patient is safe for discharge with strict return precautions.       Final Clinical Impression(s) / ED Diagnoses Final diagnoses:  Nausea and vomiting in adult  Acute cystitis with hematuria     The patient appears reasonably screened and/or stabilized for discharge and I doubt any other medical condition or other [facility] requiring further screening, evaluation, or treatment in the ED at this time prior to discharge.  Disposition: Discharge  Condition: Good  I have discussed the results, Dx and Tx plan with the patient who expressed understanding and agree(s) with the plan. Discharge instructions discussed at great length. The patient was given strict return precautions who verbalized understanding of the instructions. No further questions at time of discharge.    ED Discharge Orders         Ordered      cephALEXin (KEFLEX) 500 MG capsule  3 times daily     05/12/19 0548    ondansetron (ZOFRAN ODT) 4 MG disintegrating tablet  Every 8 hours PRN     05/12/19 0548           Follow Up: Elwyn Reach, MD [place]. [place] [address] [phone]  Schedule an appointment as soon as possible for a visit  As needed     This chart was dictated using voice recognition software.  Despite best efforts to proofread,  errors can occur which can change the documentation meaning.   Fatima Blank, MD 05/12/19 [phone]

## 2019-05-13 LAB — URINE CULTURE

## 2019-05-21 ENCOUNTER — Ambulatory Visit: Payer: 59

## 2019-05-24 ENCOUNTER — Ambulatory Visit: Payer: 59

## 2019-05-28 ENCOUNTER — Telehealth: Payer: Self-pay | Admitting: Physical Therapy

## 2019-05-28 ENCOUNTER — Ambulatory Visit: Payer: 59

## 2019-05-28 NOTE — Telephone Encounter (Signed)
Kimberly Hanson was at work and forgot to call to cancel and said she would be here 2/25 /21 for her appointment at 3:30

## 2019-05-31 ENCOUNTER — Ambulatory Visit: Payer: 59

## 2019-06-04 ENCOUNTER — Ambulatory Visit: Payer: 59 | Attending: Physician Assistant

## 2019-06-04 DIAGNOSIS — R600 Localized edema: Secondary | ICD-10-CM | POA: Insufficient documentation

## 2019-06-04 DIAGNOSIS — M25671 Stiffness of right ankle, not elsewhere classified: Secondary | ICD-10-CM | POA: Insufficient documentation

## 2019-06-04 DIAGNOSIS — R262 Difficulty in walking, not elsewhere classified: Secondary | ICD-10-CM | POA: Insufficient documentation

## 2019-06-04 DIAGNOSIS — M25571 Pain in right ankle and joints of right foot: Secondary | ICD-10-CM | POA: Insufficient documentation

## 2019-06-07 ENCOUNTER — Ambulatory Visit: Payer: 59 | Attending: Family

## 2019-06-07 ENCOUNTER — Ambulatory Visit: Payer: 59

## 2019-06-07 ENCOUNTER — Other Ambulatory Visit: Payer: Self-pay

## 2019-06-07 DIAGNOSIS — R600 Localized edema: Secondary | ICD-10-CM

## 2019-06-07 DIAGNOSIS — M25571 Pain in right ankle and joints of right foot: Secondary | ICD-10-CM | POA: Diagnosis present

## 2019-06-07 DIAGNOSIS — M25671 Stiffness of right ankle, not elsewhere classified: Secondary | ICD-10-CM | POA: Diagnosis present

## 2019-06-07 DIAGNOSIS — Z23 Encounter for immunization: Secondary | ICD-10-CM | POA: Insufficient documentation

## 2019-06-07 DIAGNOSIS — R262 Difficulty in walking, not elsewhere classified: Secondary | ICD-10-CM | POA: Diagnosis present

## 2019-06-07 NOTE — Progress Notes (Signed)
   Covid-19 Vaccination Clinic  Name:  Kimberly Hanson    MRN: [id number] DOB: 1965/08/20  06/07/2019  [name] was observed post Covid-19 immunization for 15 minutes without incident. She was provided with Vaccine Information Sheet and instruction to access the V-Safe system.   [name] was instructed to call 911 with any severe reactions post vaccine: Marland Kitchen Difficulty breathing  . Swelling of face and throat  . A fast heartbeat  . A bad rash all over body  . Dizziness and weakness   Immunizations Administered    Name Date Dose VIS Date Route   Moderna COVID-19 Vaccine 06/07/2019 10:51 AM 0.5 mL 03/06/2019 Intramuscular   Manufacturer: Moderna   Lot: [id number]   [id number]

## 2019-06-07 NOTE — Therapy (Addendum)
[place], [place], [address] Phone: [phone]   Fax:  [phone]  Physical Therapy Treatment  Patient Details  Name: Kimberly Hanson MRN: [id number] Date of Birth: 1965/10/30 Referring Provider (PT): Fae Pippin, [place]   Encounter Date: 06/07/2019  PT End of Session - 06/07/19 1350    Visit Number  2    Number of Visits  12    Date for PT Re-Evaluation  07/06/19    Authorization Type  [employer]    PT Start Time  0151    PT Stop Time  0231    PT Time Calculation (min)  40 min    Activity Tolerance  Patient tolerated treatment well    Behavior During Therapy  [facility] for tasks assessed/performed       Past Medical History:  Diagnosis Date  . Cholelithiasis    On CT 06/26/13  . Diverticulosis    On CT 06/26/13  . Hyperthyroidism   . Multinodular goiter   . Seizures ([place])     Past Surgical History:  Procedure Laterality Date  . ABDOMINAL HYSTERECTOMY  2002  . CESAREAN SECTION     x 2  . COLONOSCOPY N/A 07/04/2013   Procedure: COLONOSCOPY;  Surgeon: Milus Banister, MD;  Location: [facility];  Service: Endoscopy;  Laterality: N/A;    There were no vitals filed for this visit.  Subjective Assessment - 06/07/19 1356    Subjective  She is better not using braces now. Still limping and heel cord sore and with some swelling so MD has ordered an MRI.    Pain Location  Ankle    Pain Orientation  Right;Anterior;Posterior;Lateral    Pain Descriptors / Indicators  Aching    Pain Type  Chronic pain    Pain Onset  More than a month ago    Pain Frequency  Intermittent    Aggravating Factors   weight bearing    Pain Relieving Factors  mes , cold, elevation         [facility] PT Assessment - 06/07/19 0001      Assessment   Medical Diagnosis  RT ankle sprain    Referring Provider (PT)  Fae Pippin, PA    Onset Date/Surgical Date  03/23/19      Figure 8 Edema   Figure 8 - Right   54 cm      AROM    Right Ankle Dorsiflexion  100    Right Ankle Plantar Flexion  45    Right Ankle Inversion  30    Right Ankle Eversion  20      Strength   Overall Strength Comments  4/5 due to pain          There ex isometric 4 way 10 reps 5 sec hold.   Manual kineseotape to gastroc laied down with DF stretch.  STW to calf and heel cord    Able to demo HEp.                 PT Education - 06/07/19 1433    Education Details  HEP isometrics    Person(s) Educated  Patient    Methods  Explanation;Tactile cues;Verbal cues;Handout    Comprehension  Verbalized understanding       PT Short Term Goals - 06/07/19 1436      PT SHORT TERM GOAL #1   Title  She will report pain decr 20% or more    Status  Achieved  PT SHORT TERM GOAL #2   Title  She will be independent with inital hEP    Status  Achieved      PT SHORT TERM GOAL #3   Title  Patient will increase  rt ankle strength to 4+/5    Baseline  4/5 at best    Status  On-going      PT SHORT TERM GOAL #4   Title  She will improve AROM by 10 degrees in ones with limited ROM    Status  Achieved        PT Long Term Goals - 05/08/19 1405      PT LONG TERM GOAL #1   Title  She will be independent with all HEp    Time  6    Period  Weeks    Status  New      PT LONG TERM GOAL #2   Title  She will be able to walk with no limp with normal foot wear    Time  6    Period  Weeks    Status  New      PT LONG TERM GOAL #3   Title  FOTO score will improve to 40% limited or better    Time  6    Period  Weeks    Status  New            Plan - 06/07/19 1351    Clinical Impression Statement  improved but still having pain and difficulty walking and weakness.  Pain eased with STW to calf andc heel cord.    Added isometrics for strength pressure to tolerance  Continue manual and modalities    PT Treatment/Interventions  Taping;Cryotherapy;Iontophoresis 4mg /ml Dexamethasone;Electrical Stimulation;Stair training;Gait  training;Therapeutic activities;Therapeutic exercise;Balance training;Patient/family education;Manual lymph drainage;Manual techniques;Passive range of motion    PT Next Visit Plan  STW, Manual , modalities,  progress strength as tolerated    FOTO   PT Home Exercise Plan  towel scruches and marble pick ups, isometrics    Consulted and Agree with Plan of Care  Patient       Patient will benefit from skilled therapeutic intervention in order to improve the following deficits and impairments:  Decreased range of motion, Difficulty walking, Pain, Decreased strength, Decreased activity tolerance, Increased edema  Visit Diagnosis: Pain in right ankle and joints of right foot  Stiffness of right ankle, not elsewhere classified  Localized edema  Difficulty in walking, not elsewhere classified     Problem List Patient Active Problem List   Diagnosis Date Noted  . Chest pain   . Bradycardia 09/10/2017  . Chest pressure 09/10/2017  . Lumbar radiculopathy 03/22/2014  . Greater trochanteric bursitis of left hip 03/22/2014  . Benign neoplasm of colon 07/04/2013  . Diverticulosis of colon (without mention of hemorrhage) 07/04/2013  . External hemorrhoids without mention of complication 123XX123  . Hypothyroidism following radioiodine therapy 06/08/2013  . Unspecified constipation 09/19/2012  . Sciatica neuralgia 09/06/2012  . Multinodular goiter 07/13/2012    Darrel Hoover  PT 06/07/2019, 2:38 PM  [facility] [address] [place], [place], [address] Phone: [phone]   Fax:  [phone]  Name: Kimberly Hanson MRN: [id number] Date of Birth: July 24, 1965

## 2019-06-07 NOTE — Patient Instructions (Signed)
Isometrics RT ankle 10 reps 5 sec hold 1-2x/day

## 2019-06-11 ENCOUNTER — Other Ambulatory Visit: Payer: Self-pay

## 2019-06-11 ENCOUNTER — Ambulatory Visit: Payer: 59

## 2019-06-11 DIAGNOSIS — R262 Difficulty in walking, not elsewhere classified: Secondary | ICD-10-CM

## 2019-06-11 DIAGNOSIS — M25671 Stiffness of right ankle, not elsewhere classified: Secondary | ICD-10-CM

## 2019-06-11 DIAGNOSIS — R600 Localized edema: Secondary | ICD-10-CM

## 2019-06-11 DIAGNOSIS — M25571 Pain in right ankle and joints of right foot: Secondary | ICD-10-CM

## 2019-06-11 NOTE — Patient Instructions (Addendum)
Green and 4 way AROM RT ankle  20 reps 1-2 x/day 1-2 sec hold

## 2019-06-11 NOTE — Therapy (Signed)
[place], [place], [address] Phone: [phone]   Fax:  [phone]  Physical Therapy Treatment  Patient Details  Name: Kimberly Hanson MRN: [id number] Date of Birth: [birth year] Referring Provider (PT): Fae Pippin, [place]   Encounter Date: 06/11/2019  PT End of Session - 06/11/19 1357    Visit Number  3    Number of Visits  12    Date for PT Re-Evaluation  07/06/19    Authorization Type  [employer]    PT Start Time  0200    PT Stop Time  0245    PT Time Calculation (min)  45 min    Activity Tolerance  Patient tolerated treatment well    Behavior During Therapy  [facility] for tasks assessed/performed       Past Medical History:  Diagnosis Date  . Cholelithiasis    On CT 06/26/13  . Diverticulosis    On CT 06/26/13  . Hyperthyroidism   . Multinodular goiter   . Seizures ([place])     Past Surgical History:  Procedure Laterality Date  . ABDOMINAL HYSTERECTOMY  2002  . CESAREAN SECTION     x 2  . COLONOSCOPY N/A 07/04/2013   Procedure: COLONOSCOPY;  Surgeon: Milus Banister, MD;  [facility]: [facility];  Service: Endoscopy;  Laterality: N/A;    There were no vitals filed for this visit.  Subjective Assessment - 06/11/19 1451    Subjective  Doing some better. Tape made the foot /lleg more comfortatble for 2-3 days. Skin fine.  Did exercises    Currently in Pain?  No/denies    Pain Location  Axilla    Pain Orientation  Right;Anterior;Posterior;Lateral    Pain Descriptors / Indicators  Aching    Pain Type  Chronic pain    Pain Onset  More than a month ago    Pain Frequency  Intermittent                       [facility] Adult PT Treatment/Exercise - 06/11/19 0001      Exercises   Exercises  Ankle      Manual Therapy   Manual Therapy  Soft tissue mobilization;Passive ROM    Soft tissue mobilization  with tool to calf ands heel cord    Passive ROM  4 way RT ankle       Ankle Exercises:  Aerobic   Recumbent Bike  L2 5 min      Ankle Exercises: Standing   Heel Raises  Both;20 reps    Toe Raise  20 reps      Ankle Exercises: Seated   BAPS  Sitting;Level 3;15 reps    BAPS Limitations  clock and counter clockwise    Other Seated Ankle Exercises  Green band x 15 4 way             PT Education - 06/11/19 1448    Education Details  Band exercises    Person(s) Educated  Patient    Methods  Explanation;Tactile cues;Handout;Verbal cues    Comprehension  Verbalized understanding       PT Short Term Goals - 06/07/19 1436      PT SHORT TERM GOAL #1   Title  She will report pain decr 20% or more    Status  Achieved      PT SHORT TERM GOAL #2   Title  She will be independent with inital hEP    Status  Achieved      PT SHORT TERM GOAL #3   Title  Patient will increase  rt ankle strength to 4+/5    Baseline  4/5 at best    Status  On-going      PT SHORT TERM GOAL #4   Title  She will improve AROM by 10 degrees in ones with limited ROM    Status  Achieved        PT Long Term Goals - 05/08/19 1405      PT LONG TERM GOAL #1   Title  She will be independent with all HEp    Time  6    Period  Weeks    Status  New      PT LONG TERM GOAL #2   Title  She will be able to walk with no limp with normal foot wear    Time  6    Period  Weeks    Status  New      PT LONG TERM GOAL #3   Title  FOTO score will improve to 40% limited or better    Time  6    Period  Weeks    Status  New            Plan - 06/11/19 1357    Clinical Impression Statement  Still with limp but no pain to start and tolerated green band with resisted exercise. retaped as this made RT lower leg more comfortable.    PT Treatment/Interventions  Taping;Cryotherapy;Iontophoresis 4mg /ml Dexamethasone;Electrical Stimulation;Stair training;Gait training;Therapeutic activities;Therapeutic exercise;Balance training;Patient/family education;Manual lymph drainage;Manual techniques;Passive  range of motion    PT Next Visit Plan  STW, Manual , modalities,  progress strength as tolerated  contrast bath for home.    PT Home Exercise Plan  towel scruches and marble pick ups, isometrics, green band 4 way ankle strength    Consulted and Agree with Plan of Care  Patient       Patient will benefit from skilled therapeutic intervention in order to improve the following deficits and impairments:  Decreased range of motion, Difficulty walking, Pain, Decreased strength, Decreased activity tolerance, Increased edema  Visit Diagnosis: Stiffness of right ankle, not elsewhere classified  Pain in right ankle and joints of right foot  Localized edema  Difficulty in walking, not elsewhere classified     Problem List Patient Active Problem List   Diagnosis Date Noted  . Chest pain   . Bradycardia 09/10/2017  . Chest pressure 09/10/2017  . Lumbar radiculopathy 03/22/2014  . Greater trochanteric bursitis of left hip 03/22/2014  . Benign neoplasm of colon 07/04/2013  . Diverticulosis of colon (without mention of hemorrhage) 07/04/2013  . External hemorrhoids without mention of complication 123XX123  . Hypothyroidism following radioiodine therapy 06/08/2013  . Unspecified constipation 09/19/2012  . Sciatica neuralgia 09/06/2012  . Multinodular goiter 07/13/2012    Darrel Hoover PT 06/11/2019, 2:52 PM  [facility] [address] [place], [place], [address] Phone: [phone]   Fax:  [phone]  Name: Chiamaka Flitcraft MRN: [id number] Date of Birth: 22-Mar-1966

## 2019-06-14 ENCOUNTER — Other Ambulatory Visit: Payer: Self-pay

## 2019-06-14 ENCOUNTER — Ambulatory Visit: Payer: 59

## 2019-06-14 DIAGNOSIS — R262 Difficulty in walking, not elsewhere classified: Secondary | ICD-10-CM

## 2019-06-14 DIAGNOSIS — M25671 Stiffness of right ankle, not elsewhere classified: Secondary | ICD-10-CM

## 2019-06-14 DIAGNOSIS — M25571 Pain in right ankle and joints of right foot: Secondary | ICD-10-CM

## 2019-06-14 DIAGNOSIS — R600 Localized edema: Secondary | ICD-10-CM

## 2019-06-14 NOTE — Therapy (Signed)
[place], [place], [address] Phone: [phone]   Fax:  [phone]  Physical Therapy Treatment  Patient Details  Name: Kimberly Hanson MRN: [id number] Date of Birth: April 30, 1965 Referring Provider (PT): Fae Pippin, [place]   Encounter Date: 06/14/2019  PT End of Session - 06/14/19 1353    Visit Number  4    Number of Visits  12    Date for PT Re-Evaluation  07/06/19    Authorization Type  [employer]    PT Start Time  0153    PT Stop Time  0235    PT Time Calculation (min)  42 min    Activity Tolerance  Patient tolerated treatment well    Behavior During Therapy  [facility] for tasks assessed/performed       Past Medical History:  Diagnosis Date  . Cholelithiasis    On CT 06/26/13  . Diverticulosis    On CT 06/26/13  . Hyperthyroidism   . Multinodular goiter   . Seizures ([place])     Past Surgical History:  Procedure Laterality Date  . ABDOMINAL HYSTERECTOMY  2002  . CESAREAN SECTION     x 2  . COLONOSCOPY N/A 07/04/2013   Procedure: COLONOSCOPY;  Surgeon: Milus Banister, MD;  Location: [facility];  Service: Endoscopy;  Laterality: N/A;    There were no vitals filed for this visit.  Subjective Assessment - 06/14/19 1358    Subjective  Doing better . No new issues. Sore post exercise no pain today    Currently in Pain?  No/denies                       [facility] Adult PT Treatment/Exercise - 06/14/19 0001      Manual Therapy   Soft tissue mobilization  with tool to calf ands heel cord    Passive ROM  4 way RT ankle       Ankle Exercises: Aerobic   Recumbent Bike  L2 5 min      Ankle Exercises: Standing   Heel Raises  Both;20 reps    Toe Raise  20 reps    Braiding (Round Trip)  15 steps RT and LT    Other Standing Ankle Exercises  ba ckwards walking      Ankle Exercises: Seated   Other Seated Ankle Exercises  Green band x 15 4 way               PT Short Term Goals  - 06/07/19 1436      PT SHORT TERM GOAL #1   Title  She will report pain decr 20% or more    Status  Achieved      PT SHORT TERM GOAL #2   Title  She will be independent with inital hEP    Status  Achieved      PT SHORT TERM GOAL #3   Title  Patient will increase  rt ankle strength to 4+/5    Baseline  4/5 at best    Status  On-going      PT SHORT TERM GOAL #4   Title  She will improve AROM by 10 degrees in ones with limited ROM    Status  Achieved        PT Long Term Goals - 05/08/19 1405      PT LONG TERM GOAL #1   Title  She will be independent with all HEp    Time  6    Period  Weeks    Status  New      PT LONG TERM GOAL #2   Title  She will be able to walk with no limp with normal foot wear    Time  6    Period  Weeks    Status  New      PT LONG TERM GOAL #3   Title  FOTO score will improve to 40% limited or better    Time  6    Period  Weeks    Status  New            Plan - 06/14/19 1359    Clinical Impression Statement  Progress with less limp today walking and tolerated the blue band but reports increased effort.   She feels she is progressing.  Still incr pain after on feet for extended periods.    PT Treatment/Interventions  Taping;Cryotherapy;Iontophoresis 4mg /ml Dexamethasone;Electrical Stimulation;Stair training;Gait training;Therapeutic activities;Therapeutic exercise;Balance training;Patient/family education;Manual lymph drainage;Manual techniques;Passive range of motion    PT Next Visit Plan  STW, Manual , modalities,  progress strength as tolerated   closed chain.    PT Home Exercise Plan  towel scruches and marble pick ups, isometrics, green band 4 way ankle strength    Consulted and Agree with Plan of Care  Patient       Patient will benefit from skilled therapeutic intervention in order to improve the following deficits and impairments:  Decreased range of motion, Difficulty walking, Pain, Decreased strength, Decreased activity tolerance,  Increased edema  Visit Diagnosis: Stiffness of right ankle, not elsewhere classified  Pain in right ankle and joints of right foot  Localized edema  Difficulty in walking, not elsewhere classified     Problem List Patient Active Problem List   Diagnosis Date Noted  . Chest pain   . Bradycardia 09/10/2017  . Chest pressure 09/10/2017  . Lumbar radiculopathy 03/22/2014  . Greater trochanteric bursitis of left hip 03/22/2014  . Benign neoplasm of colon 07/04/2013  . Diverticulosis of colon (without mention of hemorrhage) 07/04/2013  . External hemorrhoids without mention of complication [id number]  . Hypothyroidism following radioiodine therapy 06/08/2013  . Unspecified constipation 09/19/2012  . Sciatica neuralgia 09/06/2012  . Multinodular goiter 07/13/2012    Darrel Hoover  PT 06/14/2019, 2:43 PM  [facility] [address] [place], [place], [address] Phone: [phone]   Fax:  [phone]  Name: Kimberly Hanson MRN: [id number] Date of Birth: 1965-08-16

## 2019-06-25 ENCOUNTER — Telehealth: Payer: Self-pay | Admitting: Physical Therapy

## 2019-06-25 ENCOUNTER — Ambulatory Visit: Payer: 59

## 2019-06-25 NOTE — Telephone Encounter (Signed)
Callled about missed appointment today. Unable to leave a message.

## 2019-06-28 ENCOUNTER — Ambulatory Visit: Payer: 59

## 2019-07-02 ENCOUNTER — Ambulatory Visit: Payer: 59

## 2019-07-05 ENCOUNTER — Ambulatory Visit: Payer: 59

## 2019-07-09 ENCOUNTER — Other Ambulatory Visit: Payer: Self-pay

## 2019-07-09 ENCOUNTER — Encounter: Payer: Self-pay | Admitting: Physical Therapy

## 2019-07-09 ENCOUNTER — Ambulatory Visit: Payer: 59 | Attending: Physician Assistant | Admitting: Physical Therapy

## 2019-07-09 DIAGNOSIS — M25571 Pain in right ankle and joints of right foot: Secondary | ICD-10-CM | POA: Insufficient documentation

## 2019-07-09 DIAGNOSIS — R600 Localized edema: Secondary | ICD-10-CM | POA: Diagnosis present

## 2019-07-09 DIAGNOSIS — R262 Difficulty in walking, not elsewhere classified: Secondary | ICD-10-CM | POA: Diagnosis present

## 2019-07-09 DIAGNOSIS — M25671 Stiffness of right ankle, not elsewhere classified: Secondary | ICD-10-CM | POA: Insufficient documentation

## 2019-07-09 NOTE — Therapy (Addendum)
[place], [place], [address] Phone: [phone]   Fax:  [phone]  Physical Therapy Treatment/Discharge  Patient Details  Name: Kimberly Hanson MRN: [id number] Date of Birth: May 10, 1965 Referring Provider (PT): Fae Pippin, [place]   Encounter Date: 07/09/2019  PT End of Session - 07/09/19 1509    Visit Number  5    Number of Visits  12    Date for PT Re-Evaluation  07/06/19    Authorization Type  [employer]    PT Start Time  0923    PT Stop Time  1500    PT Time Calculation (min)  45 min    Activity Tolerance  Patient tolerated treatment well    Behavior During Therapy  [facility] for tasks assessed/performed       Past Medical History:  Diagnosis Date  . Cholelithiasis    On CT 06/26/13  . Diverticulosis    On CT 06/26/13  . Hyperthyroidism   . Multinodular goiter   . Seizures ([place])     Past Surgical History:  Procedure Laterality Date  . ABDOMINAL HYSTERECTOMY  2002  . CESAREAN SECTION     x 2  . COLONOSCOPY N/A 07/04/2013   Procedure: COLONOSCOPY;  Surgeon: Milus Banister, MD;  [facility];  Service: Endoscopy;  Laterality: N/A;    There were no vitals filed for this visit.  Subjective Assessment - 07/09/19 1424    Subjective  Pt arriving to therapy reporting pain in R ankle 4/10. Pt feels she has made progress, but still reporting tenderness and stiffness.    Pertinent History  lumbar laminectomy    Limitations  Standing;Walking    How long can you stand comfortably?  3-4 min    How long can you walk comfortably?  No distance but can walk a block. LT leg goes numb    Diagnostic tests  xray: sprain    Currently in Pain?  Yes    Pain Score  4     Pain Location  Ankle    Pain Orientation  Right    Pain Descriptors / Indicators  Aching;Sore;Tightness    Pain Type  Chronic pain    Pain Onset  More than a month ago                       [facility] Adult PT  Treatment/Exercise - 07/09/19 0001      Manual Therapy   Manual therapy comments  achilles kineso taping for comfort    Soft tissue mobilization  IASTM to achilles and plantar fascia      Ankle Exercises: Standing   Rocker Board  2 minutes;Limitations   both directions   Heel Raises  Both;20 reps    Toe Raise  20 reps    Other Standing Ankle Exercises  Standing, stepping diagnals toward cones, lunging diagnals x 10 each directions      Ankle Exercises: Seated   Other Seated Ankle Exercises  Green band x 15 4 way             PT Education - 07/09/19 1427    Education Details  Pt was instructed in how to use the theraband independently for 4 way ankle resistive exercises.    Person(s) Educated  Patient    Methods  Explanation;Demonstration    Comprehension  Verbalized understanding;Returned demonstration       PT Short Term Goals - 06/07/19 1436  PT SHORT TERM GOAL #1   Title  She will report pain decr 20% or more    Status  Achieved      PT SHORT TERM GOAL #2   Title  She will be independent with inital hEP    Status  Achieved      PT SHORT TERM GOAL #3   Title  Patient will increase  rt ankle strength to 4+/5    Baseline  4/5 at best    Status  On-going      PT SHORT TERM GOAL #4   Title  She will improve AROM by 10 degrees in ones with limited ROM    Status  Achieved        PT Long Term Goals - 07/09/19 1513      PT LONG TERM GOAL #1   Title  She will be independent with all HEp    Time  6    Period  Weeks    Status  On-going      PT LONG TERM GOAL #2   Title  She will be able to walk with no limp with normal foot wear    Time  6    Period  Weeks    Status  On-going      PT LONG TERM GOAL #3   Title  FOTO score will improve to 40% limited or better    Time  6    Status  On-going      PT LONG TERM GOAL #4   Title  Patient will demonstrate a 45% deficit on FOTO     Time  8    Period  Weeks    Status  On-going            Plan -  07/09/19 1510    Clinical Impression Statement  Pt arriving reporting 4/10 pain in R ankle/achilles tendon. Pt reporting compliance with her HEP. Pt tolerating more standing exericses today with no reports in increased pain, IASTM performed using biofreeze and kineso tape applied to achilles for comfort at end of session. Continue skilled PT to progress toward LTG's.    Examination-Activity Limitations  Locomotion Level;Squat;Stairs    Examination-Participation Restrictions  Laundry;Meal Prep;Cleaning;Community Activity    Stability/Clinical Decision Making  Stable/Uncomplicated    Rehab Potential  Good    PT Frequency  2x / week    PT Duration  6 weeks    PT Treatment/Interventions  Taping;Cryotherapy;Iontophoresis 60m/ml Dexamethasone;Electrical Stimulation;Stair training;Gait training;Therapeutic activities;Therapeutic exercise;Balance training;Patient/family education;Manual lymph drainage;Manual techniques;Passive range of motion    PT Next Visit Plan  STW, Manual , modalities,  progress strength as tolerated   closed chain.    PT Home Exercise Plan  towel scruches and marble pick ups, isometrics, green band 4 way ankle strength    Consulted and Agree with Plan of Care  Patient       Patient will benefit from skilled therapeutic intervention in order to improve the following deficits and impairments:  Decreased range of motion, Difficulty walking, Pain, Decreased strength, Decreased activity tolerance, Increased edema  Visit Diagnosis: Stiffness of right ankle, not elsewhere classified  Pain in right ankle and joints of right foot  Localized edema  Difficulty in walking, not elsewhere classified     Problem List Patient Active Problem List   Diagnosis Date Noted  . Chest pain   . Bradycardia 09/10/2017  . Chest pressure 09/10/2017  . Lumbar radiculopathy 03/22/2014  . Greater trochanteric bursitis of left hip  03/22/2014  . Benign neoplasm of colon 07/04/2013  .  Diverticulosis of colon (without mention of hemorrhage) 07/04/2013  . External hemorrhoids without mention of complication [id number]/9009  . Hypothyroidism following radioiodine therapy 06/08/2013  . Unspecified constipation 09/19/2012  . Sciatica neuralgia 09/06/2012  . Multinodular goiter 07/13/2012    Oretha Caprice, MPT 07/09/2019, 3:16 PM  [facility] [address] [place], [place], [address] Phone: [phone]   Fax:  [phone]  Name: Kimberly Hanson MRN: [id number] Date of Birth: Dec 07, 1965  PHYSICAL THERAPY DISCHARGE SUMMARY  Visits from Start of Care: 5  Current functional level related to goals / functional outcomes: See above She canceled then no showed the last 2 appointments then did not return   Remaining deficits: Unknown   Education / Equipment: HEP Plan:                                                    Patient goals were not met. Patient is being discharged due to not returning since the last visit.  ?????    Pearson Forster  PT   08/14/19

## 2019-07-10 ENCOUNTER — Ambulatory Visit: Payer: 59

## 2019-07-10 ENCOUNTER — Ambulatory Visit: Payer: 59 | Attending: Family

## 2019-07-10 DIAGNOSIS — Z23 Encounter for immunization: Secondary | ICD-10-CM

## 2019-07-10 NOTE — Progress Notes (Signed)
   Covid-19 Vaccination Clinic  Name:  Kimberly Hanson    MRN: [id number] DOB: [birth year]  07/10/2019  [name] was observed post Covid-19 immunization for 15 minutes without incident. She was provided with Vaccine Information Sheet and instruction to access the V-Safe system.   [name] was instructed to call 911 with any severe reactions post vaccine: Marland Kitchen Difficulty breathing  . Swelling of face and throat  . A fast heartbeat  . A bad rash all over body  . Dizziness and weakness   Immunizations Administered    Name Date Dose VIS Date Route   Moderna COVID-19 Vaccine 07/10/2019 11:47 AM 0.5 mL 03/06/2019 Intramuscular   Manufacturer: Moderna   Lot: [id number]   [place]:[id number]

## 2019-07-16 ENCOUNTER — Ambulatory Visit: Payer: 59

## 2019-12-24 IMAGING — DX DG ANKLE COMPLETE 3+V*R*
3 series · 3 of 3 positions shown · non-contrast
Comparison: None.

CLINICAL DATA: Right ankle pain after fall 2 days ago.

EXAM:
RIGHT ANKLE - COMPLETE 3+ VIEW

[ankle ap]
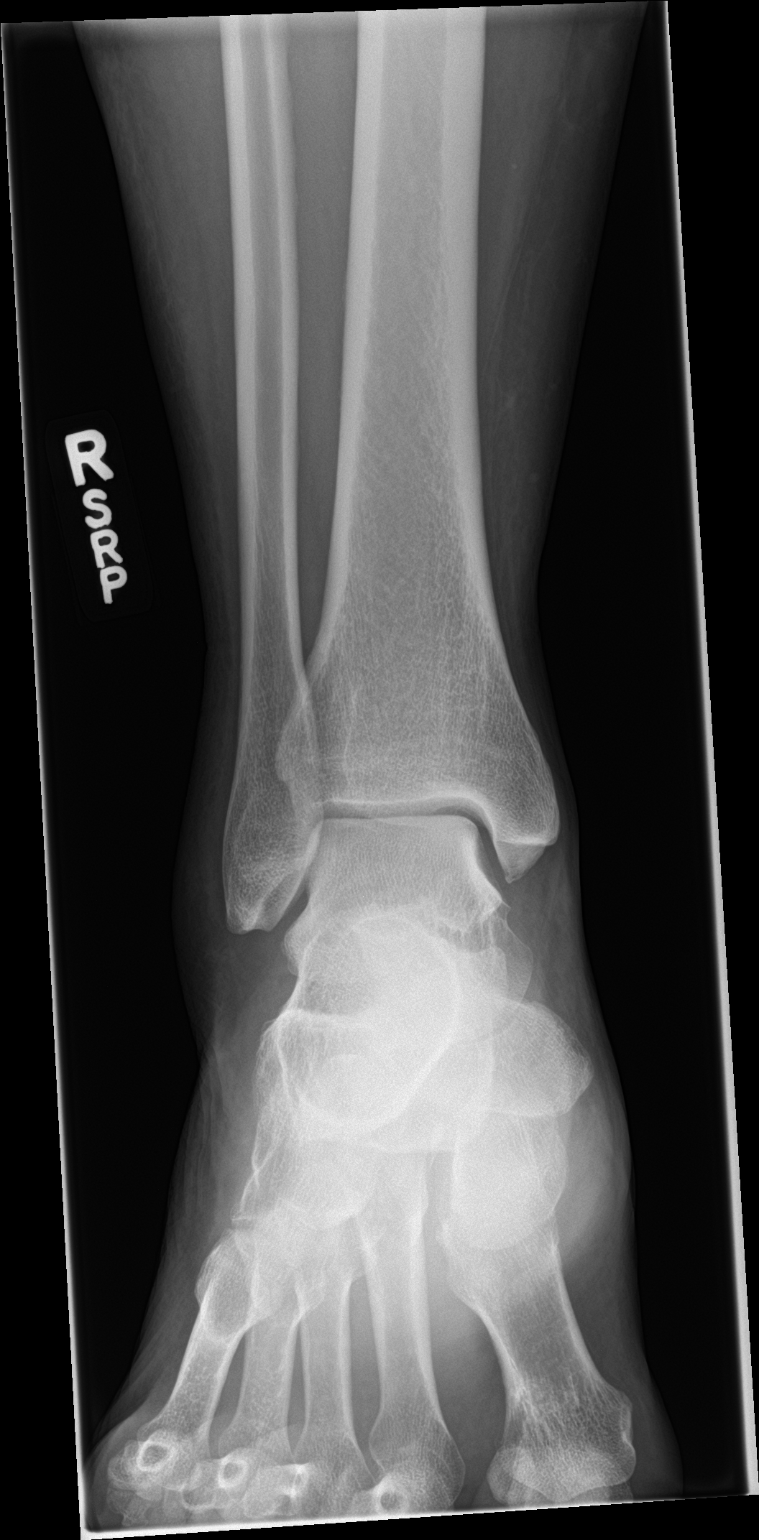

[ankle obl]
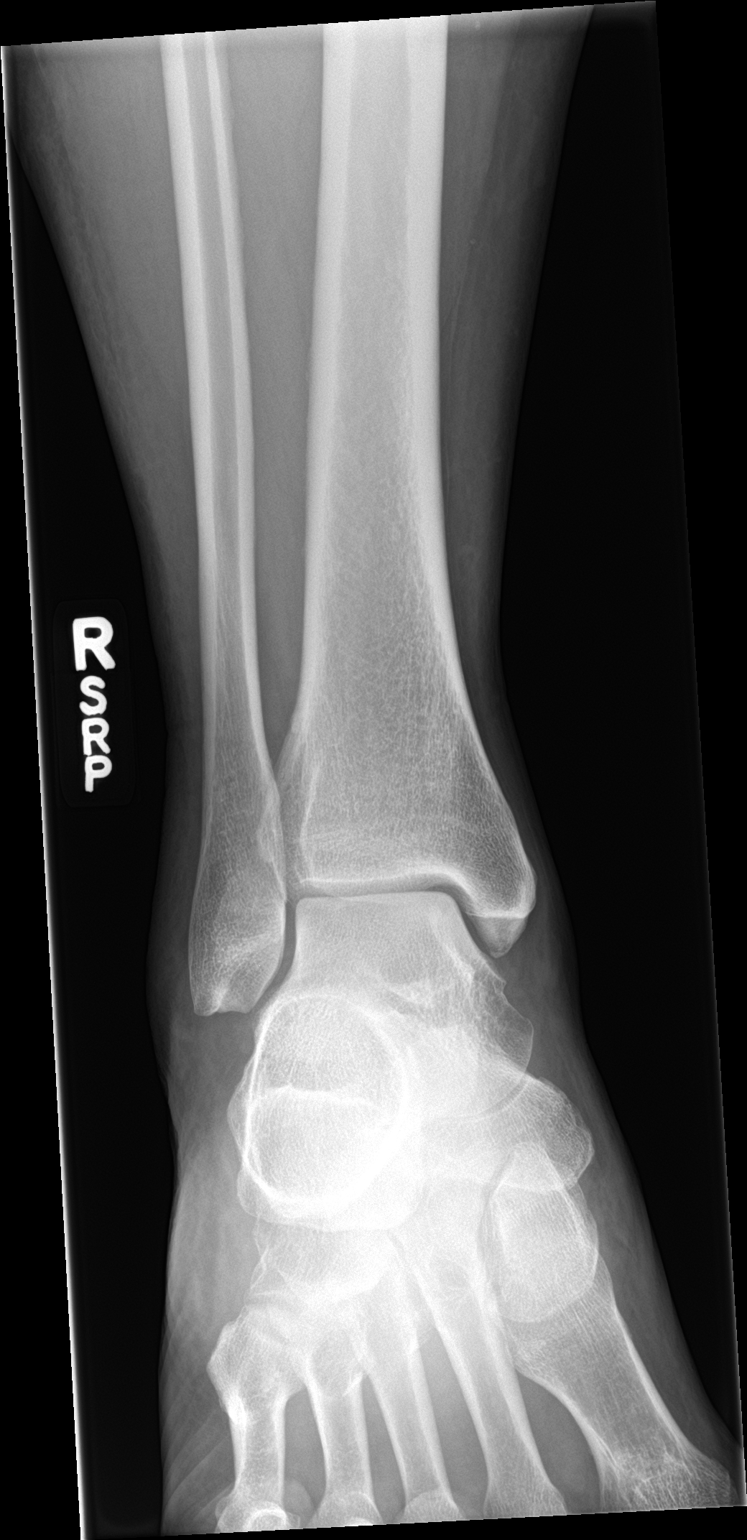

[ankle lat]
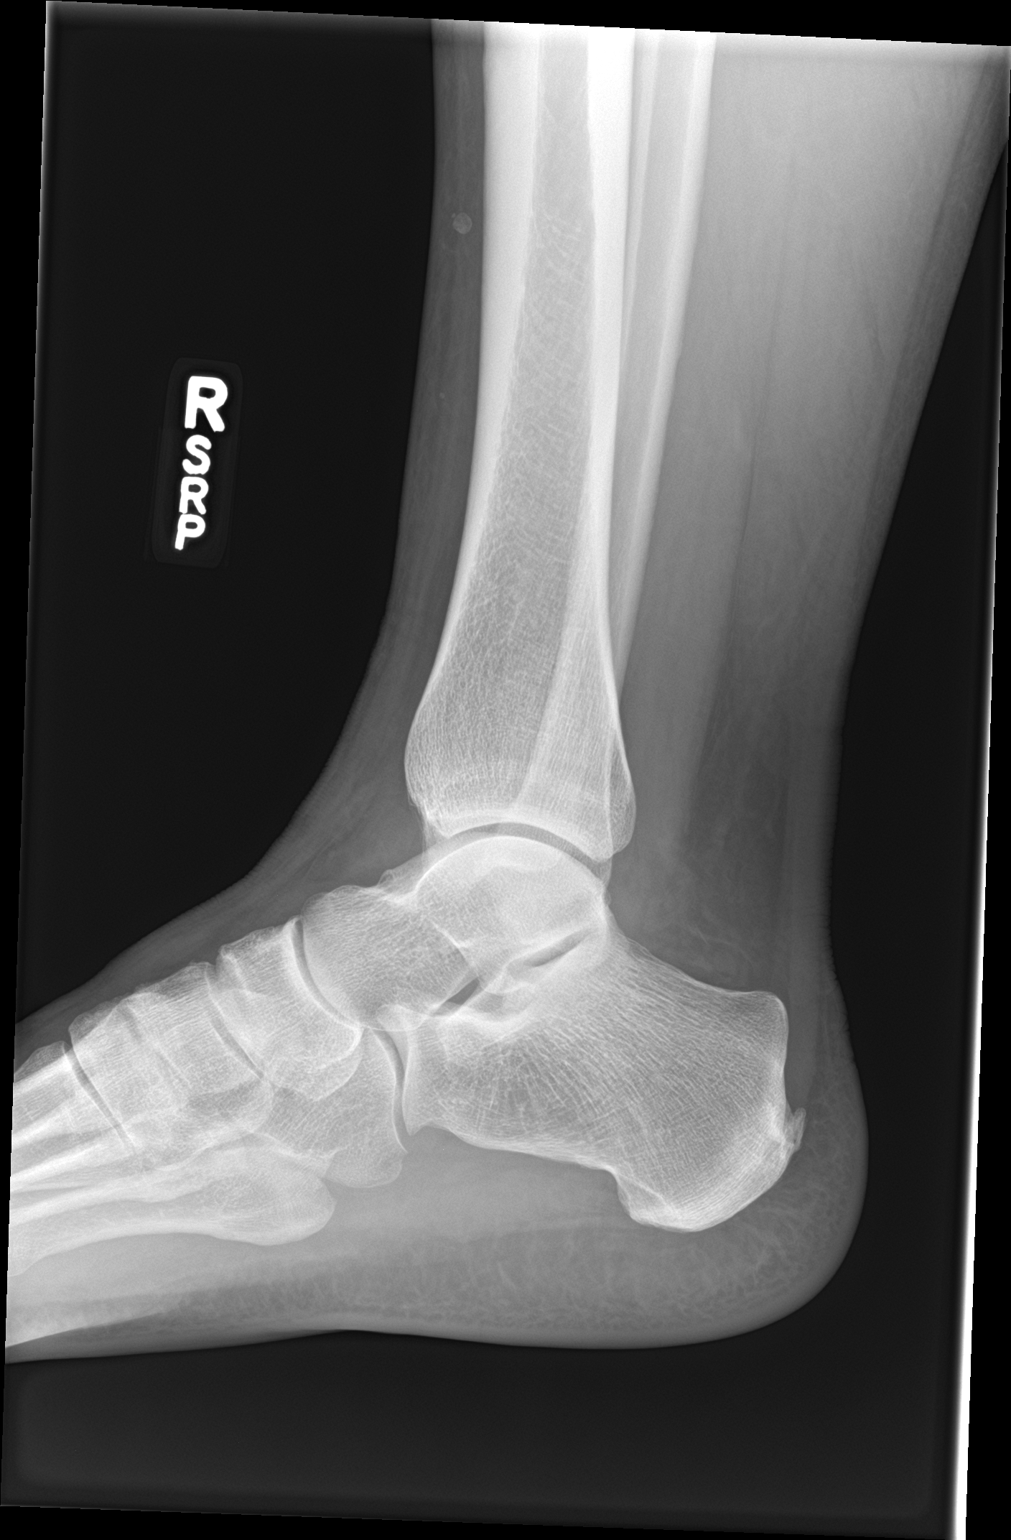

[3 of 3 positions shown; findings below may reference images not displayed]

FINDINGS: No acute fracture or dislocation. The ankle mortise is symmetric.
The talar dome is intact. Small tibiotalar joint effusion. Joint
spaces are preserved. Bone mineralization is normal. Soft tissue
swelling over the lateral malleolus.
IMPRESSION: 1. Soft tissue swelling over lateral malleolus. No acute osseous
abnormality.

## 2021-09-27 ENCOUNTER — Other Ambulatory Visit: Payer: Self-pay

## 2021-09-27 ENCOUNTER — Emergency Department (HOSPITAL_BASED_OUTPATIENT_CLINIC_OR_DEPARTMENT_OTHER)
Admission: EM | Admit: 2021-09-27 | Discharge: 2021-09-27 | Disposition: A | Discharge status: Home / Self Care | Payer: 59 | Attending: Emergency Medicine | Admitting: Emergency Medicine

## 2021-09-27 ENCOUNTER — Emergency Department (HOSPITAL_BASED_OUTPATIENT_CLINIC_OR_DEPARTMENT_OTHER): Payer: 59

## 2021-09-27 ENCOUNTER — Encounter (HOSPITAL_BASED_OUTPATIENT_CLINIC_OR_DEPARTMENT_OTHER): Payer: Self-pay | Admitting: Emergency Medicine

## 2021-09-27 DIAGNOSIS — S8392XA Sprain of unspecified site of left knee, initial encounter: Secondary | ICD-10-CM | POA: Insufficient documentation

## 2021-09-27 DIAGNOSIS — W19XXXA Unspecified fall, initial encounter: Secondary | ICD-10-CM | POA: Insufficient documentation

## 2021-09-27 DIAGNOSIS — M25462 Effusion, left knee: Secondary | ICD-10-CM | POA: Insufficient documentation

## 2021-09-27 DIAGNOSIS — S8992XA Unspecified injury of left lower leg, initial encounter: Secondary | ICD-10-CM | POA: Diagnosis present

## 2021-09-27 DIAGNOSIS — E039 Hypothyroidism, unspecified: Secondary | ICD-10-CM | POA: Insufficient documentation

## 2021-09-27 MED ORDER — OXYCODONE-ACETAMINOPHEN 5-325 MG PO TABS
1.0000 | ORAL_TABLET | Freq: Once | ORAL | Status: AC
  Administered 2021-09-27: 1 via ORAL
  Filled 2021-09-27: qty 1

## 2021-09-27 NOTE — ED Triage Notes (Signed)
Patient states that she fell yesterday on both of her knees.States that her left knee is hurting bad . States that she cannt bear weight on that leg. States that she cannt bend the knee with out help. States that she has taken tylenol ,but did not get any relief. She has also iced her knee with no relief

## 2021-09-30 ENCOUNTER — Encounter: Payer: Self-pay | Admitting: Family Medicine

## 2021-09-30 ENCOUNTER — Ambulatory Visit: Payer: 59 | Admitting: Family Medicine

## 2021-09-30 ENCOUNTER — Ambulatory Visit: Payer: Self-pay

## 2021-09-30 VITALS — BP 130/88 | Ht 66.0 in | Wt 230.0 lb

## 2021-09-30 DIAGNOSIS — S83412A Sprain of medial collateral ligament of left knee, initial encounter: Secondary | ICD-10-CM | POA: Diagnosis not present

## 2021-09-30 DIAGNOSIS — S76112A Strain of left quadriceps muscle, fascia and tendon, initial encounter: Secondary | ICD-10-CM | POA: Insufficient documentation

## 2021-09-30 DIAGNOSIS — M25562 Pain in left knee: Secondary | ICD-10-CM

## 2021-09-30 MED ORDER — PREDNISONE 5 MG PO TABS: ORAL_TABLET | ORAL | 0 refills | Status: DC

## 2021-09-30 NOTE — Assessment & Plan Note (Signed)
Acutely occurring.  Initial injury on 6/25 following a seizure.  Does have changes at the origin of the MCL. -Counseled on home exercise therapy and supportive care. -Hinged knee brace. -Prednisone. -Could consider physical therapy.

## 2021-09-30 NOTE — Patient Instructions (Signed)
Nice to meet you Please use the brace  Please use the crutches to help with weight bearing.  Please use heat on top of the knee and ice on the side   Please try range of motion  Please send me a message in MyChart with any questions or updates.  Please see me back in 2-3 weeks.   --Dr. Raeford Razor

## 2021-09-30 NOTE — Progress Notes (Signed)
  Kimberly Hanson - 56 y.o. female MRN [id number]  Date of birth: 1966/01/17  SUBJECTIVE:  Including CC & ROS.  No chief complaint on file.   Kimberly Hanson is a 56 y.o. female that is presenting with acute left knee pain following a seizure.  She is unsure the exact mechanism of the injury.  She is having limited extension and flexion.  Has a history of MPFL reconstruction on the left knee from years ago.  Having pain with weightbearing.  Review of the emergency department note from 6/25 shows she was provided crutches and oxycodone. Independent review of the left knee x-ray from 6/25 shows joint effusion.  Review of Systems See HPI   HISTORY: Past Medical, Surgical, Social, and Family History Reviewed & Updated per EMR.   Pertinent Historical Findings include:  Past Medical History:  Diagnosis Date   Cholelithiasis    On CT 06/26/13   Diverticulosis    On CT 06/26/13   Hyperthyroidism    Multinodular goiter    Seizures ([place])     Past Surgical History:  Procedure Laterality Date   ABDOMINAL HYSTERECTOMY  2002   CESAREAN SECTION     x 2   COLONOSCOPY N/A 07/04/2013   Procedure: COLONOSCOPY;  Surgeon: Milus Banister, MD;  [facility];  Service: Endoscopy;  Laterality: N/A;     PHYSICAL EXAM:  VS: BP 130/88 (BP Location: Right Arm, Patient Position: Sitting, Cuff Size: Normal)   Ht '5\' 6"'$  (1.676 m)   Wt 230 lb (104.3 kg)   BMI 37.12 kg/m  Physical Exam Gen: NAD, alert, cooperative with exam, well-appearing MSK:  Neurovascularly intact    Limited ultrasound: Left knee:  Trace effusion in the suprapatellar pouch. Increased hyperemia associated with the quadriceps tendon with mild thickening at the distal portion. Normal-appearing patellar tendon. Hypoechoic change at the origin of the MCL with hyperemia. Mild medial joint space narrowing. Increased hyperemia over the lateral meniscus  Summary: Findings most consistent with quad tendon strain and MCL  tear.  Ultrasound and interpretation by Clearance Coots, MD    ASSESSMENT & PLAN:   Sprain of medial collateral ligament of left knee Acutely occurring.  Initial injury on 6/25 following a seizure.  Does have changes at the origin of the MCL. -Counseled on home exercise therapy and supportive care. -Hinged knee brace. -Prednisone. -Could consider physical therapy.  Strain of left quadriceps tendon Acutely occurring.  Initial injury on 6/25.  Is able to hold knee in extension against gravity. -Counseled on home exercise therapy and supportive care. -Could consider physical therapy.

## 2021-09-30 NOTE — Assessment & Plan Note (Signed)
Acutely occurring.  Initial injury on 6/25.  Is able to hold knee in extension against gravity. -Counseled on home exercise therapy and supportive care. -Could consider physical therapy.

## 2021-09-30 NOTE — Addendum Note (Signed)
Addended by: Cresenciano Lick on: 09/30/2021 02:39 PM   Modules accepted: Orders

## 2021-10-21 ENCOUNTER — Ambulatory Visit (INDEPENDENT_AMBULATORY_CARE_PROVIDER_SITE_OTHER): Payer: 59 | Admitting: Family Medicine

## 2021-10-21 ENCOUNTER — Encounter: Payer: Self-pay | Admitting: Family Medicine

## 2021-10-21 VITALS — BP 122/80 | Ht 66.0 in | Wt 230.0 lb

## 2021-10-21 DIAGNOSIS — S76112D Strain of left quadriceps muscle, fascia and tendon, subsequent encounter: Secondary | ICD-10-CM | POA: Diagnosis not present

## 2021-10-21 DIAGNOSIS — S83412D Sprain of medial collateral ligament of left knee, subsequent encounter: Secondary | ICD-10-CM

## 2021-10-21 NOTE — Assessment & Plan Note (Signed)
Having tenderness and pain on the medial aspect.  Does have a component of the meniscus being irritated. -Counseled on home exercise therapy and supportive care. -Counseled on hinged knee brace and knee immobilizer. -Could consider injection or further imaging.

## 2021-10-21 NOTE — Patient Instructions (Signed)
Good to see you  Please continue heat and ice  You can use the knee immobilizer as needed  Please try the ace wrap  Please send me a message in MyChart with any questions or updates.  Please see me back in 3 weeks.   --Dr. Raeford Razor

## 2021-10-21 NOTE — Assessment & Plan Note (Signed)
Slowly regaining the strength and extension.  Continues to improve with range of motion. -Counseled on home exercise therapy and supportive care. -Counseled on knee immobilizer and hinged knee brace. -Could consider physical therapy

## 2021-10-21 NOTE — Progress Notes (Signed)
  Kimberly Hanson - 56 y.o. female MRN [id number]  Date of birth: 16-Jul-1965  SUBJECTIVE:  Including CC & ROS.  No chief complaint on file.   Kimberly Hanson is a 56 y.o. female that is following up for her left knee injury.  Continues to have pain over the medial aspect as well as the proximal aspect.  Getting improvement with range of motion.  Still has pain with weightbearing and is using crutches.   Review of Systems See HPI   HISTORY: Past Medical, Surgical, Social, and Family History Reviewed & Updated per EMR.   Pertinent Historical Findings include:  Past Medical History:  Diagnosis Date   Cholelithiasis    On CT 06/26/13   Diverticulosis    On CT 06/26/13   Hyperthyroidism    Multinodular goiter    Seizures ([place])     Past Surgical History:  Procedure Laterality Date   ABDOMINAL HYSTERECTOMY  2002   CESAREAN SECTION     x 2   COLONOSCOPY N/A 07/04/2013   Procedure: COLONOSCOPY;  Surgeon: Milus Banister, MD;  [facility];  Service: Endoscopy;  Laterality: N/A;     PHYSICAL EXAM:  VS: BP 122/80 (BP Location: Left Arm, Patient Position: Sitting)   Ht '5\' 6"'$  (1.676 m)   Wt 230 lb (104.3 kg)   BMI 37.12 kg/m  Physical Exam Gen: NAD, alert, cooperative with exam, well-appearing MSK:  Neurovascularly intact       ASSESSMENT & PLAN:   Strain of left quadriceps tendon Slowly regaining the strength and extension.  Continues to improve with range of motion. -Counseled on home exercise therapy and supportive care. -Counseled on knee immobilizer and hinged knee brace. -Could consider physical therapy  Sprain of medial collateral ligament of left knee Having tenderness and pain on the medial aspect.  Does have a component of the meniscus being irritated. -Counseled on home exercise therapy and supportive care. -Counseled on hinged knee brace and knee immobilizer. -Could consider injection or further imaging.

## 2021-11-11 ENCOUNTER — Ambulatory Visit (INDEPENDENT_AMBULATORY_CARE_PROVIDER_SITE_OTHER): Payer: Commercial Managed Care - HMO | Admitting: Family Medicine

## 2021-11-11 VITALS — BP 112/70 | Ht 66.0 in | Wt 224.0 lb

## 2021-11-11 DIAGNOSIS — S83242D Other tear of medial meniscus, current injury, left knee, subsequent encounter: Secondary | ICD-10-CM | POA: Diagnosis not present

## 2021-11-11 DIAGNOSIS — M179 Osteoarthritis of knee, unspecified: Secondary | ICD-10-CM | POA: Insufficient documentation

## 2021-11-11 DIAGNOSIS — S83242A Other tear of medial meniscus, current injury, left knee, initial encounter: Secondary | ICD-10-CM | POA: Insufficient documentation

## 2021-11-11 MED ORDER — PREDNISONE 5 MG PO TABS: ORAL_TABLET | ORAL | 0 refills | Status: AC

## 2021-11-11 NOTE — Assessment & Plan Note (Signed)
Initial injury on 6/20.  Had a seizure which was the inciting event and unsure of the exact mechanism of her injury.  She has been under greater than 6 weeks of physician directed home exercise therapy.  Still unable to bear weight.  We have tried medications. -Counseled on home exercise therapy and supportive care. -Prednisone. -MRI of the left knee to evaluate for meniscal tear and for presurgical planning.

## 2021-11-11 NOTE — Progress Notes (Signed)
  Kimberly Hanson - 56 y.o. female MRN [id number]  Date of birth: 10/14/1965  SUBJECTIVE:  Including CC & ROS.  No chief complaint on file.   Kimberly Hanson is a 56 y.o. female that is following up for her left knee pain.  She initially had the injury from a seizure.  She continues to have pain.  She was unable to bear weight on the knee.  She has continued limited range of motion.   Review of Systems See HPI   HISTORY: Past Medical, Surgical, Social, and Family History Reviewed & Updated per EMR.   Pertinent Historical Findings include:  Past Medical History:  Diagnosis Date   Cholelithiasis    On CT 06/26/13   Diverticulosis    On CT 06/26/13   Hyperthyroidism    Multinodular goiter    Seizures ([place])     Past Surgical History:  Procedure Laterality Date   ABDOMINAL HYSTERECTOMY  2002   CESAREAN SECTION     x 2   COLONOSCOPY N/A 07/04/2013   Procedure: COLONOSCOPY;  Surgeon: Milus Banister, MD;  [facility];  Service: Endoscopy;  Laterality: N/A;     PHYSICAL EXAM:  VS: BP 112/70   Ht '5\' 6"'$  (1.676 m)   Wt 224 lb (101.6 kg)   BMI 36.15 kg/m  Physical Exam Gen: NAD, alert, cooperative with exam, well-appearing MSK:  Left knee: Limited flexion and extension. Positive Murray's test. Unable to bear weight without a crutch. Neurovascularly intact       ASSESSMENT & PLAN:   Acute medial meniscus tear of left knee Initial injury on 6/20.  Had a seizure which was the inciting event and unsure of the exact mechanism of her injury.  She has been under greater than 6 weeks of physician directed home exercise therapy.  Still unable to bear weight.  We have tried medications. -Counseled on home exercise therapy and supportive care. -Prednisone. -MRI of the left knee to evaluate for meniscal tear and for presurgical planning.

## 2021-11-11 NOTE — Patient Instructions (Signed)
Good to see you Please continue the brace  We will obtain the MRI from [facility] Please send me a message in [place] with any questions or updates.  We will set up a virtual visit once the MRI is resulted..   --Dr. Raeford Razor

## 2021-12-12 ENCOUNTER — Other Ambulatory Visit: Payer: Medicaid Other

## 2021-12-15 ENCOUNTER — Ambulatory Visit (INDEPENDENT_AMBULATORY_CARE_PROVIDER_SITE_OTHER): Payer: Commercial Managed Care - HMO

## 2021-12-15 DIAGNOSIS — S83242D Other tear of medial meniscus, current injury, left knee, subsequent encounter: Secondary | ICD-10-CM | POA: Diagnosis not present

## 2021-12-17 ENCOUNTER — Telehealth (INDEPENDENT_AMBULATORY_CARE_PROVIDER_SITE_OTHER): Payer: Commercial Managed Care - HMO | Admitting: Family Medicine

## 2021-12-17 ENCOUNTER — Encounter: Payer: Self-pay | Admitting: Family Medicine

## 2021-12-17 VITALS — Ht 66.0 in | Wt 224.0 lb

## 2021-12-17 DIAGNOSIS — M1712 Unilateral primary osteoarthritis, left knee: Secondary | ICD-10-CM | POA: Diagnosis not present

## 2021-12-17 DIAGNOSIS — S76112D Strain of left quadriceps muscle, fascia and tendon, subsequent encounter: Secondary | ICD-10-CM

## 2021-12-17 NOTE — Progress Notes (Signed)
Virtual Visit via Video Note  I connected with Kimberly Hanson on 12/17/21 at  1:30 PM EDT by a video enabled telemedicine application and verified that I am speaking with the correct person using two identifiers.  Location: Patient: home Provider: office   I discussed the limitations of evaluation and management by telemedicine and the availability of in person appointments. The patient expressed understanding and agreed to proceed.  History of Present Illness:  Kimberly Hanson is a 56 year old female that is following up after the MRI of the left knee.  This was demonstrating a moderate distal quadriceps tendinosis without tear and a full-thickness cartilage defect of the lateral tibial plateau and a small intra-articular loose body.  There is thickening at the [facility].   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 -Counseled on home exercise therapy and supportive care. -Referral to physical therapy - Could consider shockwave therapy   Osteoarthritis of left knee MRI was demonstrating a chondral defect of the lateral tibial plateau with moderate patellofemoral degenerative changes as well as a loose body in the posterior aspect of the knee. -Counseled on home exercise therapy and supportive care. -Proceed gel injection.  Follow Up Instructions:    I discussed the assessment and treatment plan with the patient. The patient was provided an opportunity to ask questions and all were answered. The patient agreed with the plan and demonstrated an understanding of the instructions.   The patient was advised to call back or seek an in-person evaluation if the symptoms worsen or if the condition fails to improve as anticipated.     Clearance Coots, MD

## 2021-12-17 NOTE — Assessment & Plan Note (Signed)
Appreciated on recent MRI. -Counseled on home exercise therapy and supportive care. -Referral to physical therapy - Could consider shockwave therapy

## 2021-12-17 NOTE — Assessment & Plan Note (Signed)
MRI was demonstrating a chondral defect of the lateral tibial plateau with moderate patellofemoral degenerative changes as well as a loose body in the posterior aspect of the knee. -Counseled on home exercise therapy and supportive care. -Proceed gel injection.

## 2021-12-21 ENCOUNTER — Telehealth: Payer: Self-pay | Admitting: Family Medicine

## 2021-12-21 NOTE — Telephone Encounter (Signed)
Submitted request to [facility] one for  left knee gel injection today. Uploaded last office note to portal.

## 2021-12-23 ENCOUNTER — Other Ambulatory Visit: Payer: Self-pay

## 2021-12-23 ENCOUNTER — Encounter: Payer: Self-pay | Admitting: Physical Therapy

## 2021-12-23 ENCOUNTER — Ambulatory Visit: Payer: Commercial Managed Care - HMO | Attending: Family Medicine | Admitting: Physical Therapy

## 2021-12-23 DIAGNOSIS — S76112D Strain of left quadriceps muscle, fascia and tendon, subsequent encounter: Secondary | ICD-10-CM | POA: Diagnosis not present

## 2021-12-23 DIAGNOSIS — M25562 Pain in left knee: Secondary | ICD-10-CM

## 2021-12-23 DIAGNOSIS — M6281 Muscle weakness (generalized): Secondary | ICD-10-CM | POA: Diagnosis present

## 2021-12-23 DIAGNOSIS — M1712 Unilateral primary osteoarthritis, left knee: Secondary | ICD-10-CM | POA: Insufficient documentation

## 2021-12-23 DIAGNOSIS — R262 Difficulty in walking, not elsewhere classified: Secondary | ICD-10-CM | POA: Diagnosis not present

## 2021-12-23 NOTE — Therapy (Signed)
OUTPATIENT PHYSICAL THERAPY LOWER EXTREMITY EVALUATION   Patient Name: Kimberly Hanson MRN: [id number] DOB:1965-06-13, 56 y.o., female Today's Date: 12/23/2021   PT End of Session - 12/23/21 1427     Visit Number 1    Number of Visits 12    Date for PT Re-Evaluation 02/03/22    Authorization Type [place] Medicaid [employer]    PT Start Time 1315    PT Stop Time 1400    PT Time Calculation (min) 45 min    Activity Tolerance Patient tolerated treatment well    Behavior During Therapy [facility] for tasks assessed/performed             Past Medical History:  Diagnosis Date   Cholelithiasis    On CT 06/26/13   Diverticulosis    On CT 06/26/13   Hyperthyroidism    Multinodular goiter    Seizures ([place])    Past Surgical History:  Procedure Laterality Date   ABDOMINAL HYSTERECTOMY  2002   CESAREAN SECTION     x 2   COLONOSCOPY N/A 07/04/2013   Procedure: COLONOSCOPY;  Surgeon: Milus Banister, MD;  Location: [facility];  Service: Endoscopy;  Laterality: N/A;   Patient Active Problem List   Diagnosis Date Noted   OA (osteoarthritis) of knee 11/11/2021   Strain of left quadriceps tendon 09/30/2021   Sprain of medial collateral ligament of left knee 09/30/2021   Chest pain    Bradycardia 09/10/2017   Chest pressure 09/10/2017   Lumbar radiculopathy 03/22/2014   Greater trochanteric bursitis of left hip 03/22/2014   Benign neoplasm of colon 07/04/2013   Diverticulosis of colon (without mention of hemorrhage) 07/04/2013   External hemorrhoids without mention of complication 19/41/7408   Hypothyroidism following radioiodine therapy 06/08/2013   Unspecified constipation 09/19/2012   Sciatica neuralgia 09/06/2012   Multinodular goiter 07/13/2012     REFERRING PROVIDER: Clearance Coots  REFERRING DIAG:  X44.818H (ICD-10-CM) - Strain of left quadriceps tendon, subsequent encounter  M17.12 (ICD-10-CM) - Primary osteoarthritis of left knee    THERAPY DIAG:  Difficulty in walking,  not elsewhere classified  Acute pain of left knee  Muscle weakness (generalized)  Rationale for Evaluation and Treatment Rehabilitation  ONSET DATE: July 2023  SUBJECTIVE:   SUBJECTIVE STATEMENT: Pt had a seizure 10 weeks ago and fell onto her knees. She had a left quad tendon strain and medial meniscus tear. She was on crutches and in knee immobilizer x 8 weeks. She is now wearing velcro knee brace and ambulating with a cane. She states there was a new finding of bone chips in Lt patella and MD may perform injections. She states she feels "achey" all the time and has increased pain with wt bearing. Pain decreases with rest, ice, soaking in the tub  PERTINENT HISTORY: Pt had Lt knee injury 20 years ago with patella stabilization surgery  PAIN:  Are you having pain? Yes: NPRS scale: 6/10 Pain location: Lt knee Pain description: throb, ache Aggravating factors: wt bearing, wt shifting Relieving factors: ice, rest  PRECAUTIONS: None  WEIGHT BEARING RESTRICTIONS No  FALLS:  Has patient fallen in last 6 months? Yes. Number of falls 1 with Lt quad tendon injury  LIVING ENVIRONMENT: Lives with: lives with their family Lives in: House/apartment Stairs:  able to live on ground level Has following equipment at home: Single point cane and Crutches  OCCUPATION: on disability due to back issues  PLOF: Independent  PATIENT GOALS improve gait and Lt knee mobility, decrease pain  OBJECTIVE:   DIAGNOSTIC FINDINGS:  Lt knee MRI 1. 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 2. Mild tricompartmental osteoarthritis with a focal full-thickness cartilage defect of the lateral tibial plateau measuring 10 x 3 mm. 3. Small knee joint effusion with two small intra-articular loose bodies. 4. Thickening of the MCL, likely sequela of prior sprain. 5. Intact menisci.  PATIENT SURVEYS:  LEFS 12.5%  COGNITION:  Overall cognitive status: Within functional limits for tasks  assessed     SENSATION: WFL    PALPATION: TTP Lt knee lateral joint line  LOWER EXTREMITY ROM:  Active ROM Right eval Left eval  Hip flexion    Hip extension    Hip abduction    Hip adduction    Hip internal rotation    Hip external rotation    Knee flexion 135 105  Knee extension 0 0  Ankle dorsiflexion    Ankle plantarflexion    Ankle inversion    Ankle eversion     (Blank rows = not tested)  LOWER EXTREMITY MMT:  MMT Right eval Left eval  Hip flexion 4 3-  Hip extension 4 3-  Hip abduction 4 3-  Hip adduction    Hip internal rotation    Hip external rotation    Knee flexion 4 3  Knee extension 4 3-  Ankle dorsiflexion    Ankle plantarflexion    Ankle inversion    Ankle eversion     (Blank rows = not tested)   FUNCTIONAL TESTS:  Unable to perform SLS on Lt LE due to pain  GAIT: Distance walked: 51' Assistive device utilized: Single point cane Level of assistance: Modified independence Comments: antalgic gait, decreased stance time on Lt, toe walking on Lt    TODAY'S TREATMENT: Quad set Lt 5 x 3 sec hold Lt Hip abd supine x 10 Lt heel slide x 5 Seated Lt knee extension/flexion AROM x 10 Gastroc stretch seated with strap 3 x 30 seconds Standing wt shifts laterally and A/P   PATIENT EDUCATION:  Education details: PT POC and goals, HEP Person educated: Patient Education method: Explanation, Demonstration, and Handouts Education comprehension: verbalized understanding and returned demonstration   HOME EXERCISE PROGRAM: Access Code: [email or web address] URL: [email or web address] Date: 12/23/2021 Prepared by: Isabelle Course  Exercises - Seated Gastroc Stretch with Strap  - 1 x daily - 7 x weekly - 1 sets - 3 reps - 20-30 seconds hold - Supine Heel Slide  - 1 x daily - 7 x weekly - 2 sets - 10 reps - Supine Quad Set  - 1 x daily - 7 x weekly - 2 sets - 10 reps - 3 seconds hold - Supine Hip Abduction  - 1 x daily - 7 x weekly - 2 sets  - 10 reps - Seated Knee Extension AROM  - 1 x daily - 7 x weekly - 2 sets - 10 reps - Side to Side Weight Shift with Counter Support  - 1 x daily - 7 x weekly - 2 sets - 10 reps - Staggered Stance Forward Backward Weight Shift with Counter Support  - 1 x daily - 7 x weekly - 2 sets - 10 reps  ASSESSMENT:  CLINICAL IMPRESSION: Patient is a  56 y.o. female who was seen today for physical therapy evaluation and treatment for Lt quad tendinosis and Lt knee arthritis. Pt presents with decreased strength, ROM, increased pain, impaired gait, impaired balance and decreased functional mobility. Pt will benefit from skilled PT to address deficits  and improve functional mobility.   OBJECTIVE IMPAIRMENTS Abnormal gait, decreased activity tolerance, decreased balance, decreased mobility, difficulty walking, decreased ROM, decreased strength, and pain.   ACTIVITY LIMITATIONS standing, squatting, stairs, and locomotion level  PARTICIPATION LIMITATIONS: community activity and yard work  PERSONAL FACTORS Time since onset of injury/illness/exacerbation are also affecting patient's functional outcome.   REHAB POTENTIAL: Good  CLINICAL DECISION MAKING: Evolving/moderate complexity  EVALUATION COMPLEXITY: Moderate  GOALS: Goals reviewed with patient? Yes  SHORT TERM GOALS: Target date: 01/06/2022   Patient will be independent with initial HEP. Baseline: pt does not have HEP Goal status: INITIAL   LONG TERM GOALS: Target date: 02/03/2022   Patient will be independent with advanced/ongoing HEP to improve outcomes and carryover.  Baseline: pt does not have HEP Goal status: INITIAL  2.  Patient will report at least 75% improvement in left knee pain to improve QOL. Baseline: pain 6/10 Goal status: INITIAL  3.  Patient will demonstrate improved Left knee AROM to >/= 115-125 deg to allow for normal gait and stair mechanics. Baseline: 105 Goal status: INITIAL  4.  Patient will demonstrate improved  functional LE strength as demonstrated by 4+/5 hip and knee strength bilat. Baseline: 3-/5 on Lt, 4/5 Rt Goal status: INITIAL  5.  Patient will be able to ambulate 600' with LRAD and normal gait pattern without increased pain to access community.  Baseline: 23' with SPC with abnormal gait pattern Goal status: INITIAL  6.  Patient will report 50% on LEFS  to demonstrate improved functional ability. Baseline: 12.5% Goal status: INITIAL     PLAN: PT FREQUENCY: 2x/week  PT DURATION: 6 weeks  PLANNED INTERVENTIONS: Therapeutic exercises, Therapeutic activity, Neuromuscular re-education, Balance training, Gait training, Patient/Family education, Self Care, Joint mobilization, Stair training, DME instructions, Aquatic Therapy, Dry Needling, Cryotherapy, Moist heat, Taping, Manual therapy, and Re-evaluation  PLAN FOR NEXT SESSION: assess and progress HEP, gait, balance, knee strength and ROM   Dajion Bickford, PT 12/23/2021, 2:29 PM

## 2021-12-24 NOTE — Telephone Encounter (Signed)
Update from portal: medication not covered by health plan.

## 2021-12-28 NOTE — Therapy (Signed)
OUTPATIENT PHYSICAL THERAPY TREATMENT   Patient Name: Kimberly Hanson MRN: [id number] DOB:December 02, 1965, 56 y.o., female Today's Date: 12/29/2021   PT End of Session - 12/29/21 1356     Visit Number 2    Number of Visits 12    Date for PT Re-Evaluation 02/03/22    Authorization Type [place] Medicaid [employer]    PT Start Time 1354    PT Stop Time 1440    PT Time Calculation (min) 46 min    Activity Tolerance Patient tolerated treatment well    Behavior During Therapy [facility] for tasks assessed/performed              Past Medical History:  Diagnosis Date   Cholelithiasis    On CT 06/26/13   Diverticulosis    On CT 06/26/13   Hyperthyroidism    Multinodular goiter    Seizures ([place])    Past Surgical History:  Procedure Laterality Date   ABDOMINAL HYSTERECTOMY  2002   CESAREAN SECTION     x 2   COLONOSCOPY N/A 07/04/2013   Procedure: COLONOSCOPY;  Surgeon: Milus Banister, MD;  Location: [facility];  Service: Endoscopy;  Laterality: N/A;   Patient Active Problem List   Diagnosis Date Noted   OA (osteoarthritis) of knee 11/11/2021   Strain of left quadriceps tendon 09/30/2021   Sprain of medial collateral ligament of left knee 09/30/2021   Chest pain    Bradycardia 09/10/2017   Chest pressure 09/10/2017   Lumbar radiculopathy 03/22/2014   Greater trochanteric bursitis of left hip 03/22/2014   Benign neoplasm of colon 07/04/2013   Diverticulosis of colon (without mention of hemorrhage) 07/04/2013   External hemorrhoids without mention of complication 99/37/1696   Hypothyroidism following radioiodine therapy 06/08/2013   Unspecified constipation 09/19/2012   Sciatica neuralgia 09/06/2012   Multinodular goiter 07/13/2012     REFERRING PROVIDER: Clearance Coots  REFERRING DIAG:  V89.381O (ICD-10-CM) - Strain of left quadriceps tendon, subsequent encounter  M17.12 (ICD-10-CM) - Primary osteoarthritis of left knee    THERAPY DIAG:  Difficulty in walking, not elsewhere  classified  Acute pain of left knee  Muscle weakness (generalized)  Rationale for Evaluation and Treatment Rehabilitation  ONSET DATE: July 2023  SUBJECTIVE:   SUBJECTIVE STATEMENT: Left knee aches a lot.    PAIN:  Are you having pain? Yes: NPRS scale: 6/10 Pain location: Lt knee Pain description: throb, ache Aggravating factors: wt bearing, wt shifting Relieving factors: ice, rest   PERTINENT HISTORY: Pt had Lt knee injury 20 years ago with patella stabilization surgery  PRECAUTIONS: None  WEIGHT BEARING RESTRICTIONS No  FALLS:  Has patient fallen in last 6 months? Yes. Number of falls 1 with Lt quad tendon injury  LIVING ENVIRONMENT: Lives with: lives with their family Lives in: House/apartment Stairs:  able to live on ground level Has following equipment at home: Single point cane and Crutches  OCCUPATION: on disability due to back issues  PLOF: Independent  PATIENT GOALS improve gait and Lt knee mobility, decrease pain   OBJECTIVE:   DIAGNOSTIC FINDINGS:  Lt knee MRI 1. 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 2. Mild tricompartmental osteoarthritis with a focal full-thickness cartilage defect of the lateral tibial plateau measuring 10 x 3 mm. 3. Small knee joint effusion with two small intra-articular loose bodies. 4. Thickening of the MCL, likely sequela of prior sprain. 5. Intact menisci.  PATIENT SURVEYS:  LEFS 12.5%  COGNITION:  Overall cognitive status: Within functional limits for tasks assessed  SENSATION: WFL    PALPATION: TTP Lt knee lateral joint line  LOWER EXTREMITY ROM:  Active ROM Right eval Left eval  Hip flexion    Hip extension    Hip abduction    Hip adduction    Hip internal rotation    Hip external rotation    Knee flexion 135 105  Knee extension 0 0  Ankle dorsiflexion    Ankle plantarflexion    Ankle inversion    Ankle eversion     (Blank rows = not tested)  LOWER EXTREMITY MMT:  MMT  Right eval Left eval  Hip flexion 4 3-  Hip extension 4 3-  Hip abduction 4 3-  Hip adduction    Hip internal rotation    Hip external rotation    Knee flexion 4 3  Knee extension 4 3-  Ankle dorsiflexion    Ankle plantarflexion    Ankle inversion    Ankle eversion     (Blank rows = not tested)   FUNCTIONAL TESTS:  Unable to perform SLS on Lt LE due to pain  GAIT: Distance walked: 66' Assistive device utilized: Single point cane Level of assistance: Modified independence Comments: antalgic gait, decreased stance time on Lt, toe walking on Lt    TODAY'S TREATMENT: 12/29/21 Nustep L 5 x 6 min    Weight shifting: side to side and in semi tandem stance bil with active quad contraction and DF x10 each direction TKE with ball on wall 5 sec x 10   Seated Lt knee extension/flexion AROM x 10   Gastroc stretch seated with strap 3 x 30 seconds  Standing hip ABD, ext, flexion, marching x 10 ea bil   Gait: using RW to encourage equal WB and full step/stride length. 100 ft    12/23/21 Quad set Lt 5 x 3 sec hold Lt Hip abd supine x 10 Lt heel slide x 5 Seated Lt knee extension/flexion AROM x 10 Gastroc stretch seated with strap 3 x 30 seconds Standing wt shifts laterally and A/P   PATIENT EDUCATION:  Education details: HEP update - standing hip and gait safety with discussion of using bil crutches to normalize pattern  Person educated: Patient Education method: Explanation, Demonstration, and Handouts Education comprehension: verbalized understanding and returned demonstration   HOME EXERCISE PROGRAM: Access Code: [email or web address] URL: [email or web address] Date: 12/29/2021 Prepared by: Almyra Free  Exercises - Seated Gastroc Stretch with Strap  - 1 x daily - 7 x weekly - 1 sets - 3 reps - 20-30 seconds hold - Supine Heel Slide  - 1 x daily - 7 x weekly - 2 sets - 10 reps - Supine Quad Set  - 1 x daily - 7 x weekly - 2 sets - 10 reps - 3 seconds hold - Seated Knee  Extension AROM  - 1 x daily - 7 x weekly - 2 sets - 10 reps - Side to Side Weight Shift with Counter Support  - 1 x daily - 7 x weekly - 2 sets - 10 reps - Staggered Stance Forward Backward Weight Shift with Counter Support  - 1 x daily - 7 x weekly - 2 sets - 10 reps - Standing Hip Flexion with Counter Support  - 1 x daily - 7 x weekly - 3 sets - 10 reps - Standing Hip Abduction with Counter Support  - 1 x daily - 7 x weekly - 3 sets - 10 reps - Standing Hip Extension with Counter Support  - 1 x  daily - 7 x weekly - 3 sets - 10 reps - Standing March with Counter Support  - 1 x daily - 7 x weekly - 3 sets - 10 reps  ASSESSMENT:  CLINICAL IMPRESSION:  Today we focused on gait activities initially with pre-gait wt shifting and then utilizing RW to equalize WBing and step length. Beth did very well with this and was encouraged to use her bil crutches at home to practice correct form. She was able to tolerate standing TE without increased pain. Initially did right step out with hip ABD, but progressed quickly to hip regular hip ABD. She would benefit from working on sit to stands and equalizing wt shift here as well. Beth continues to demonstrate potential for improvement and would benefit from continued skilled therapy to address impairments.    OBJECTIVE IMPAIRMENTS Abnormal gait, decreased activity tolerance, decreased balance, decreased mobility, difficulty walking, decreased ROM, decreased strength, and pain.   ACTIVITY LIMITATIONS standing, squatting, stairs, and locomotion level  PARTICIPATION LIMITATIONS: community activity and yard work  PERSONAL FACTORS Time since onset of injury/illness/exacerbation are also affecting patient's functional outcome.   REHAB POTENTIAL: Good  CLINICAL DECISION MAKING: Evolving/moderate complexity  EVALUATION COMPLEXITY: Moderate  GOALS: Goals reviewed with patient? Yes  SHORT TERM GOALS: Target date: 01/06/2022   Patient will be independent with  initial HEP. Baseline: pt does not have HEP Goal status: INITIAL   LONG TERM GOALS: Target date: 02/03/2022   Patient will be independent with advanced/ongoing HEP to improve outcomes and carryover.  Baseline: pt does not have HEP Goal status: INITIAL  2.  Patient will report at least 75% improvement in left knee pain to improve QOL. Baseline: pain 6/10 Goal status: INITIAL  3.  Patient will demonstrate improved Left knee AROM to >/= 115-125 deg to allow for normal gait and stair mechanics. Baseline: 105 Goal status: INITIAL  4.  Patient will demonstrate improved functional LE strength as demonstrated by 4+/5 hip and knee strength bilat. Baseline: 3-/5 on Lt, 4/5 Rt Goal status: INITIAL  5.  Patient will be able to ambulate 600' with LRAD and normal gait pattern without increased pain to access community.  Baseline: 43' with SPC with abnormal gait pattern Goal status: INITIAL  6.  Patient will report 50% on LEFS  to demonstrate improved functional ability. Baseline: 12.5% Goal status: INITIAL     PLAN: PT FREQUENCY: 2x/week  PT DURATION: 6 weeks  PLANNED INTERVENTIONS: Therapeutic exercises, Therapeutic activity, Neuromuscular re-education, Balance training, Gait training, Patient/Family education, Self Care, Joint mobilization, Stair training, DME instructions, Aquatic Therapy, Dry Needling, Cryotherapy, Moist heat, Taping, Manual therapy, and Re-evaluation  PLAN FOR NEXT SESSION: continue to work on gait, hip and knee strength, ROM and balance.    Jaelyn Cloninger, PT 12/29/2021, 2:52 PM

## 2021-12-28 NOTE — Telephone Encounter (Signed)
Kimberly Panning, do you know if any gel injections are covered by her plan?

## 2021-12-29 ENCOUNTER — Encounter: Payer: Self-pay | Admitting: Physical Therapy

## 2021-12-29 ENCOUNTER — Ambulatory Visit: Payer: Commercial Managed Care - HMO | Admitting: Physical Therapy

## 2021-12-29 DIAGNOSIS — R262 Difficulty in walking, not elsewhere classified: Secondary | ICD-10-CM | POA: Diagnosis not present

## 2021-12-29 DIAGNOSIS — M6281 Muscle weakness (generalized): Secondary | ICD-10-CM

## 2021-12-29 DIAGNOSIS — M25562 Pain in left knee: Secondary | ICD-10-CM

## 2021-12-31 ENCOUNTER — Ambulatory Visit: Payer: Commercial Managed Care - HMO

## 2021-12-31 DIAGNOSIS — M25562 Pain in left knee: Secondary | ICD-10-CM

## 2021-12-31 DIAGNOSIS — R262 Difficulty in walking, not elsewhere classified: Secondary | ICD-10-CM

## 2021-12-31 DIAGNOSIS — M6281 Muscle weakness (generalized): Secondary | ICD-10-CM

## 2021-12-31 NOTE — Therapy (Signed)
OUTPATIENT PHYSICAL THERAPY TREATMENT   Patient Name: Kimberly Hanson MRN: [id number] DOB:1965-10-15, 56 y.o., female Today's Date: 12/31/2021      Past Medical History:  Diagnosis Date   Cholelithiasis    On CT 06/26/13   Diverticulosis    On CT 06/26/13   Hyperthyroidism    Multinodular goiter    Seizures ([place])    Past Surgical History:  Procedure Laterality Date   ABDOMINAL HYSTERECTOMY  2002   CESAREAN SECTION     x 2   COLONOSCOPY N/A 07/04/2013   Procedure: COLONOSCOPY;  Surgeon: Milus Banister, MD;  Location: [facility];  Service: Endoscopy;  Laterality: N/A;   Patient Active Problem List   Diagnosis Date Noted   OA (osteoarthritis) of knee 11/11/2021   Strain of left quadriceps tendon 09/30/2021   Sprain of medial collateral ligament of left knee 09/30/2021   Chest pain    Bradycardia 09/10/2017   Chest pressure 09/10/2017   Lumbar radiculopathy 03/22/2014   Greater trochanteric bursitis of left hip 03/22/2014   Benign neoplasm of colon 07/04/2013   Diverticulosis of colon (without mention of hemorrhage) 07/04/2013   External hemorrhoids without mention of complication 42/70/6237   Hypothyroidism following radioiodine therapy 06/08/2013   Unspecified constipation 09/19/2012   Sciatica neuralgia 09/06/2012   Multinodular goiter 07/13/2012     REFERRING PROVIDER: Clearance Coots  REFERRING DIAG:  S28.315V (ICD-10-CM) - Strain of left quadriceps tendon, subsequent encounter  M17.12 (ICD-10-CM) - Primary osteoarthritis of left knee    THERAPY DIAG:  Difficulty in walking, not elsewhere classified  Acute pain of left knee  Muscle weakness (generalized)  Rationale for Evaluation and Treatment Rehabilitation  ONSET DATE: July 2023  SUBJECTIVE:   SUBJECTIVE STATEMENT: Sore today after last session.   PAIN:  Are you having pain? Yes: NPRS scale: 6/10 Pain location: Lt knee Pain description: throb, ache Aggravating factors: wt bearing, wt  shifting Relieving factors: ice, rest   PERTINENT HISTORY: Pt had Lt knee injury 20 years ago with patella stabilization surgery  PRECAUTIONS: None  WEIGHT BEARING RESTRICTIONS No  FALLS:  Has patient fallen in last 6 months? Yes. Number of falls 1 with Lt quad tendon injury  LIVING ENVIRONMENT: Lives with: lives with their family Lives in: House/apartment Stairs:  able to live on ground level Has following equipment at home: Single point cane and Crutches  OCCUPATION: on disability due to back issues  PLOF: Independent  PATIENT GOALS improve gait and Lt knee mobility, decrease pain   OBJECTIVE:   DIAGNOSTIC FINDINGS:  Lt knee MRI 1. 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 2. Mild tricompartmental osteoarthritis with a focal full-thickness cartilage defect of the lateral tibial plateau measuring 10 x 3 mm. 3. Small knee joint effusion with two small intra-articular loose bodies. 4. Thickening of the MCL, likely sequela of prior sprain. 5. Intact menisci.  PATIENT SURVEYS:  LEFS 12.5%  COGNITION:  Overall cognitive status: Within functional limits for tasks assessed     SENSATION: WFL    PALPATION: TTP Lt knee lateral joint line  LOWER EXTREMITY ROM:  Active ROM Right eval Left eval  Hip flexion    Hip extension    Hip abduction    Hip adduction    Hip internal rotation    Hip external rotation    Knee flexion 135 105  Knee extension 0 0  Ankle dorsiflexion    Ankle plantarflexion    Ankle inversion    Ankle eversion     (Blank  rows = not tested)  LOWER EXTREMITY MMT:  MMT Right eval Left eval  Hip flexion 4 3-  Hip extension 4 3-  Hip abduction 4 3-  Hip adduction    Hip internal rotation    Hip external rotation    Knee flexion 4 3  Knee extension 4 3-  Ankle dorsiflexion    Ankle plantarflexion    Ankle inversion    Ankle eversion     (Blank rows = not tested)   FUNCTIONAL TESTS:  Unable to perform SLS on Lt LE  due to pain  GAIT: Distance walked: 20' Assistive device utilized: Single point cane Level of assistance: Modified independence Comments: antalgic gait, decreased stance time on Lt, toe walking on Lt    TODAY'S TREATMENT: 12/31/21 Nustep L5x63mn LE only Runner stretch x 30 sec bil Church pew x 20  Retro step x 20 L quad stretch prone with strap 2x30" Prone HS curls x 10 L LE Prone hip extension 2 x 10 LLE - very challenging STS x 10 with emphasis on slow eccentric control to seat Manual Therapy: STM to L VL, ITB, medial joint line  Gait Training: 90 ft no AD, SBA, cues for heel strike  12/29/21 Nustep L 5 x 6 min    Weight shifting: side to side and in semi tandem stance bil with active quad contraction and DF x10 each direction TKE with ball on wall 5 sec x 10   Seated Lt knee extension/flexion AROM x 10   Gastroc stretch seated with strap 3 x 30 seconds  Standing hip ABD, ext, flexion, marching x 10 ea bil   Gait: using RW to encourage equal WB and full step/stride length. 100 ft    12/23/21 Quad set Lt 5 x 3 sec hold Lt Hip abd supine x 10 Lt heel slide x 5 Seated Lt knee extension/flexion AROM x 10 Gastroc stretch seated with strap 3 x 30 seconds Standing wt shifts laterally and A/P   PATIENT EDUCATION:  Education details: HEP update  Person educated: Patient Education method: Explanation, Demonstration, and Handouts Education comprehension: verbalized understanding and returned demonstration   HOME EXERCISE PROGRAM: Access Code: [email or web address]: [email or web address] Date: 12/31/2021 Prepared by: BClarene Essex Exercises - Seated Gastroc Stretch with Strap  - 1 x daily - 7 x weekly - 1 sets - 3 reps - 20-30 seconds hold - Supine Heel Slide  - 1 x daily - 7 x weekly - 2 sets - 10 reps - Supine Quad Set  - 1 x daily - 7 x weekly - 2 sets - 10 reps - 3 seconds hold - Seated Knee Extension AROM  - 1 x daily - 7 x weekly - 2 sets - 10 reps -  Side to Side Weight Shift with Counter Support  - 1 x daily - 7 x weekly - 2 sets - 10 reps - Staggered Stance Forward Backward Weight Shift with Counter Support  - 1 x daily - 7 x weekly - 2 sets - 10 reps - Standing Hip Flexion with Counter Support  - 1 x daily - 7 x weekly - 3 sets - 10 reps - Standing Hip Abduction with Counter Support  - 1 x daily - 7 x weekly - 3 sets - 10 reps - Standing Hip Extension with Counter Support  - 1 x daily - 7 x weekly - 3 sets - 10 reps - Standing March with Counter Support  - 1 x daily - 7  x weekly - 3 sets - 10 reps - Prone Quadriceps Stretch with Strap  - 1 x daily - 7 x weekly - 3 sets - 30 sec hold - Prone Hip Extension  - 1 x daily - 7 x weekly - 3 sets - 10 reps  ASSESSMENT:  CLINICAL IMPRESSION: Beth tolerated the progression of TE well this session. Continued working on WB exercises to improve gait mechanics and stability during gait. Added quad stretch to HEP to reduce taut band along the VL. Pt had difficulty with prone hip ext so we also added this to HEP. She was very tender in the lateral portion of the quads.  OBJECTIVE IMPAIRMENTS Abnormal gait, decreased activity tolerance, decreased balance, decreased mobility, difficulty walking, decreased ROM, decreased strength, and pain.   ACTIVITY LIMITATIONS standing, squatting, stairs, and locomotion level  PARTICIPATION LIMITATIONS: community activity and yard work  PERSONAL FACTORS Time since onset of injury/illness/exacerbation are also affecting patient's functional outcome.   REHAB POTENTIAL: Good  CLINICAL DECISION MAKING: Evolving/moderate complexity  EVALUATION COMPLEXITY: Moderate  GOALS: Goals reviewed with patient? Yes  SHORT TERM GOALS: Target date: 01/06/2022   Patient will be independent with initial HEP. Baseline: pt does not have HEP Goal status: IN PROGRESS   LONG TERM GOALS: Target date: 02/03/2022   Patient will be independent with advanced/ongoing HEP to improve  outcomes and carryover.  Baseline: pt does not have HEP Goal status: IN PROGRESS  2.  Patient will report at least 75% improvement in left knee pain to improve QOL. Baseline: pain 6/10 Goal status: IN PROGRESS  3.  Patient will demonstrate improved Left knee AROM to >/= 115-125 deg to allow for normal gait and stair mechanics. Baseline: 105 Goal status: IN PROGRESS  4.  Patient will demonstrate improved functional LE strength as demonstrated by 4+/5 hip and knee strength bilat. Baseline: 3-/5 on Lt, 4/5 Rt Goal status: IN PROGRESS  5.  Patient will be able to ambulate 600' with LRAD and normal gait pattern without increased pain to access community.  Baseline: 69' with SPC with abnormal gait pattern Goal status: IN PROGRESS  6.  Patient will report 50% on LEFS  to demonstrate improved functional ability. Baseline: 12.5% Goal status: IN PROGRESS     PLAN: PT FREQUENCY: 2x/week  PT DURATION: 6 weeks  PLANNED INTERVENTIONS: Therapeutic exercises, Therapeutic activity, Neuromuscular re-education, Balance training, Gait training, Patient/Family education, Self Care, Joint mobilization, Stair training, DME instructions, Aquatic Therapy, Dry Needling, Cryotherapy, Moist heat, Taping, Manual therapy, and Re-evaluation  PLAN FOR NEXT SESSION: continue to work on gait, hip and knee strength, ROM and balance.    Artist Pais, PTA 12/31/2021, 1:28 PM

## 2022-01-04 NOTE — Therapy (Signed)
OUTPATIENT PHYSICAL THERAPY TREATMENT   Patient Name: Kimberly Hanson MRN: [id number] DOB:01-10-1966, 56 y.o., female Today's Date: 01/05/2022   PT End of Session - 01/05/22 1313     Visit Number 4    Number of Visits 12    Date for PT Re-Evaluation 02/03/22    Authorization Type [place] [employer]    PT Start Time 1310    PT Stop Time 6010    PT Time Calculation (min) 38 min    Activity Tolerance Patient tolerated treatment well    Behavior During Therapy [facility] for tasks assessed/performed               Past Medical History:  Diagnosis Date   Cholelithiasis    On CT 06/26/13   Diverticulosis    On CT 06/26/13   Hyperthyroidism    Multinodular goiter    Seizures ([place])    Past Surgical History:  Procedure Laterality Date   ABDOMINAL HYSTERECTOMY  2002   CESAREAN SECTION     x 2   COLONOSCOPY N/A 07/04/2013   Procedure: COLONOSCOPY;  Surgeon: Milus Banister, MD;  Location: [facility];  Service: Endoscopy;  Laterality: N/A;   Patient Active Problem List   Diagnosis Date Noted   OA (osteoarthritis) of knee 11/11/2021   Strain of left quadriceps tendon 09/30/2021   Sprain of medial collateral ligament of left knee 09/30/2021   Chest pain    Bradycardia 09/10/2017   Chest pressure 09/10/2017   Lumbar radiculopathy 03/22/2014   Greater trochanteric bursitis of left hip 03/22/2014   Benign neoplasm of colon 07/04/2013   Diverticulosis of colon (without mention of hemorrhage) 07/04/2013   External hemorrhoids without mention of complication 93/23/5573   Hypothyroidism following radioiodine therapy 06/08/2013   Unspecified constipation 09/19/2012   Sciatica neuralgia 09/06/2012   Multinodular goiter 07/13/2012     REFERRING PROVIDER: Clearance Coots  REFERRING DIAG:  U20.254Y (ICD-10-CM) - Strain of left quadriceps tendon, subsequent encounter  M17.12 (ICD-10-CM) - Primary osteoarthritis of left knee    THERAPY DIAG:  Difficulty in walking, not elsewhere  classified  Acute pain of left knee  Muscle weakness (generalized)  Rationale for Evaluation and Treatment Rehabilitation  ONSET DATE: July 2023  SUBJECTIVE:   SUBJECTIVE STATEMENT: I see a difference already. I am standing up straighter and I can put more pressure on it.   PAIN:  Are you having pain? Yes: NPRS scale: 4/10 Pain location: Lt knee Pain description: throb, ache Aggravating factors: wt bearing, wt shifting Relieving factors: ice, rest   PERTINENT HISTORY: Pt had Lt knee injury 20 years ago with patella stabilization surgery  PRECAUTIONS: None  WEIGHT BEARING RESTRICTIONS No  FALLS:  Has patient fallen in last 6 months? Yes. Number of falls 1 with Lt quad tendon injury  LIVING ENVIRONMENT: Lives with: lives with their family Lives in: House/apartment Stairs:  able to live on ground level Has following equipment at home: Single point cane and Crutches  OCCUPATION: on disability due to back issues  PLOF: Independent  PATIENT GOALS improve gait and Lt knee mobility, decrease pain   OBJECTIVE:   DIAGNOSTIC FINDINGS:  Lt knee MRI 1. 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 2. Mild tricompartmental osteoarthritis with a focal full-thickness cartilage defect of the lateral tibial plateau measuring 10 x 3 mm. 3. Small knee joint effusion with two small intra-articular loose bodies. 4. Thickening of the MCL, likely sequela of prior sprain. 5. Intact menisci.  PATIENT SURVEYS:  LEFS 12.5%  COGNITION:  Overall cognitive status: Within functional limits for tasks assessed     SENSATION: WFL    PALPATION: TTP Lt knee lateral joint line  LOWER EXTREMITY ROM:  Active ROM Right eval Left eval  Hip flexion    Hip extension    Hip abduction    Hip adduction    Hip internal rotation    Hip external rotation    Knee flexion 135 105  Knee extension 0 0  Ankle dorsiflexion    Ankle plantarflexion    Ankle inversion    Ankle  eversion     (Blank rows = not tested)  LOWER EXTREMITY MMT:  MMT Right eval Left eval  Hip flexion 4 3-  Hip extension 4 3-  Hip abduction 4 3-  Hip adduction    Hip internal rotation    Hip external rotation    Knee flexion 4 3  Knee extension 4 3-  Ankle dorsiflexion    Ankle plantarflexion    Ankle inversion    Ankle eversion     (Blank rows = not tested)   FUNCTIONAL TESTS:  Unable to perform SLS on Lt LE due to pain  GAIT: Distance walked: 43' Assistive device utilized: Single point cane Level of assistance: Modified independence Comments: antalgic gait, decreased stance time on Lt, toe walking on Lt    TODAY'S TREATMENT:  01/05/22 Nustep L5x35mn LE only Standing hip flexor stretch left x 30 sec. Seated hip flexor stretch bil  x 30 sec   Church pew x 20 fwd and bwd   Functional squat x 8   STS x 10 with emphasis on slow eccentric control to seat   Seated knee rotation x 20  Manual Therapy: IASTM to left quads, VMO, VLO and ITB Skilled palpation and monitoring of soft tissues during DN Trigger Point Dry-Needling  Treatment instructions: Expect mild to moderate muscle soreness. S/S of pneumothorax if dry needled over a lung field, and to seek immediate medical attention should they occur. Patient verbalized understanding of these instructions and education.  Patient Consent Given: Yes Education handout provided: Yes Muscles treated: left lateral quads Electrical stimulation performed: No Parameters: N/A Treatment response/outcome: Twitch Response Elicited and Palpable Increase in Muscle Length   12/31/21 Nustep L5x636m LE only Runner stretch x 30 sec bil Church pew x 20  Retro step x 20 L quad stretch prone with strap 2x30" Prone HS curls x 10 L LE Prone hip extension 2 x 10 LLE - very challenging STS x 10 with emphasis on slow eccentric control to seat Manual Therapy: STM to L VL, ITB, medial joint line  Gait Training: 90 ft no AD, SBA, cues  for heel strike  12/29/21 Nustep L 5 x 6 min    Weight shifting: side to side and in semi tandem stance bil with active quad contraction and DF x10 each direction TKE with ball on wall 5 sec x 10   Seated Lt knee extension/flexion AROM x 10   Gastroc stretch seated with strap 3 x 30 seconds  Standing hip ABD, ext, flexion, marching x 10 ea bil   Gait: using RW to encourage equal WB and full step/stride length. 100 ft    12/23/21 Quad set Lt 5 x 3 sec hold Lt Hip abd supine x 10 Lt heel slide x 5 Seated Lt knee extension/flexion AROM x 10 Gastroc stretch seated with strap 3 x 30 seconds Standing wt shifts laterally and A/P   PATIENT EDUCATION:  Education  details: HEP update  Person educated: Patient Education method: Explanation, Demonstration, and Handouts Education comprehension: verbalized understanding and returned demonstration   HOME EXERCISE PROGRAM: Access Code: [email or web address] URL: [email or web address] Date: 01/05/2022 Prepared by: Almyra Free  Exercises - Seated Gastroc Stretch with Strap  - 1 x daily - 7 x weekly - 1 sets - 3 reps - 20-30 seconds hold - Supine Heel Slide  - 1 x daily - 7 x weekly - 2 sets - 10 reps - Supine Quad Set  - 1 x daily - 7 x weekly - 2 sets - 10 reps - 3 seconds hold - Seated Knee Extension AROM  - 1 x daily - 7 x weekly - 2 sets - 10 reps - Side to Side Weight Shift with Counter Support  - 1 x daily - 7 x weekly - 2 sets - 10 reps - Staggered Stance Forward Backward Weight Shift with Counter Support  - 1 x daily - 7 x weekly - 2 sets - 10 reps - Standing Hip Flexion with Counter Support  - 1 x daily - 7 x weekly - 3 sets - 10 reps - Standing Hip Abduction with Counter Support  - 1 x daily - 7 x weekly - 3 sets - 10 reps - Standing Hip Extension with Counter Support  - 1 x daily - 7 x weekly - 3 sets - 10 reps - Standing March with Counter Support  - 1 x daily - 7 x weekly - 3 sets - 10 reps - Prone Quadriceps Stretch with Strap  - 1  x daily - 7 x weekly - 3 sets - 30 sec hold - Prone Hip Extension  - 1 x daily - 7 x weekly - 3 sets - 10 reps - Seated Hip Flexor Stretch  - 2 x daily - 7 x weekly - 1 sets - 3 reps - 30-60 sec hold ASSESSMENT:  CLINICAL IMPRESSION: Eustaquio Maize presents with reports of improved function. She states she is able to stand up straighter and put more weight through the left knee when walking. HEP progressed with a seated hip flexor stretch. Still very tight in left lateral quads and ITB. Very good response to IASTM and DN with palpable decreased tension after. Beth continues to demonstrate potential for improvement and would benefit from continued skilled therapy to address impairments.    OBJECTIVE IMPAIRMENTS Abnormal gait, decreased activity tolerance, decreased balance, decreased mobility, difficulty walking, decreased ROM, decreased strength, and pain.   ACTIVITY LIMITATIONS standing, squatting, stairs, and locomotion level  PARTICIPATION LIMITATIONS: community activity and yard work  PERSONAL FACTORS Time since onset of injury/illness/exacerbation are also affecting patient's functional outcome.   REHAB POTENTIAL: Good  CLINICAL DECISION MAKING: Evolving/moderate complexity  EVALUATION COMPLEXITY: Moderate  GOALS: Goals reviewed with patient? Yes  SHORT TERM GOALS: Target date: 01/06/2022   Patient will be independent with initial HEP. Baseline: pt does not have HEP Goal status: IN PROGRESS   LONG TERM GOALS: Target date: 02/03/2022   Patient will be independent with advanced/ongoing HEP to improve outcomes and carryover.  Baseline: pt does not have HEP Goal status: IN PROGRESS  2.  Patient will report at least 75% improvement in left knee pain to improve QOL. Baseline: pain 6/10 Goal status: IN PROGRESS  3.  Patient will demonstrate improved Left knee AROM to >/= 115-125 deg to allow for normal gait and stair mechanics. Baseline: 105 Goal status: IN PROGRESS  4.  Patient will  demonstrate improved functional LE strength  as demonstrated by 4+/5 hip and knee strength bilat. Baseline: 3-/5 on Lt, 4/5 Rt Goal status: IN PROGRESS  5.  Patient will be able to ambulate 600' with LRAD and normal gait pattern without increased pain to access community.  Baseline: 58' with SPC with abnormal gait pattern Goal status: IN PROGRESS  6.  Patient will report 50% on LEFS  to demonstrate improved functional ability. Baseline: 12.5% Goal status: IN PROGRESS     PLAN: PT FREQUENCY: 2x/week  PT DURATION: 6 weeks  PLANNED INTERVENTIONS: Therapeutic exercises, Therapeutic activity, Neuromuscular re-education, Balance training, Gait training, Patient/Family education, Self Care, Joint mobilization, Stair training, DME instructions, Aquatic Therapy, Dry Needling, Cryotherapy, Moist heat, Taping, Manual therapy, and Re-evaluation  PLAN FOR NEXT SESSION: Assess DN, STM and continue as indicated,  continue to work on gait, hip and knee strength, ROM and balance.    Taryll Reichenberger, PT 01/05/2022, 1:56 PM

## 2022-01-05 ENCOUNTER — Encounter: Payer: Self-pay | Admitting: Physical Therapy

## 2022-01-05 ENCOUNTER — Ambulatory Visit: Payer: Commercial Managed Care - HMO | Attending: Family Medicine | Admitting: Physical Therapy

## 2022-01-05 DIAGNOSIS — R262 Difficulty in walking, not elsewhere classified: Secondary | ICD-10-CM | POA: Diagnosis present

## 2022-01-05 DIAGNOSIS — M25562 Pain in left knee: Secondary | ICD-10-CM | POA: Insufficient documentation

## 2022-01-05 DIAGNOSIS — M6281 Muscle weakness (generalized): Secondary | ICD-10-CM | POA: Insufficient documentation

## 2022-01-07 ENCOUNTER — Ambulatory Visit: Payer: Commercial Managed Care - HMO | Admitting: Physical Therapy

## 2022-01-12 ENCOUNTER — Ambulatory Visit: Payer: Commercial Managed Care - HMO

## 2022-01-12 DIAGNOSIS — M25562 Pain in left knee: Secondary | ICD-10-CM

## 2022-01-12 DIAGNOSIS — R262 Difficulty in walking, not elsewhere classified: Secondary | ICD-10-CM

## 2022-01-12 DIAGNOSIS — M6281 Muscle weakness (generalized): Secondary | ICD-10-CM

## 2022-01-12 NOTE — Therapy (Signed)
OUTPATIENT PHYSICAL THERAPY TREATMENT   Patient Name: Kimberly Hanson MRN: [id number] DOB:Oct 03, 1965, 56 y.o., female Today's Date: 01/12/2022   PT End of Session - 01/12/22 1401     Visit Number 5    Number of Visits 12    Date for PT Re-Evaluation 02/03/22    Authorization Type [place] [employer]    PT Start Time 1325   pt late   PT Stop Time 1358    PT Time Calculation (min) 33 min    Activity Tolerance Patient tolerated treatment well    Behavior During Therapy [facility] for tasks assessed/performed                Past Medical History:  Diagnosis Date   Cholelithiasis    On CT 06/26/13   Diverticulosis    On CT 06/26/13   Hyperthyroidism    Multinodular goiter    Seizures ([place])    Past Surgical History:  Procedure Laterality Date   ABDOMINAL HYSTERECTOMY  2002   CESAREAN SECTION     x 2   COLONOSCOPY N/A 07/04/2013   Procedure: COLONOSCOPY;  Surgeon: Milus Banister, MD;  Location: [facility];  Service: Endoscopy;  Laterality: N/A;   Patient Active Problem List   Diagnosis Date Noted   OA (osteoarthritis) of knee 11/11/2021   Strain of left quadriceps tendon 09/30/2021   Sprain of medial collateral ligament of left knee 09/30/2021   Chest pain    Bradycardia 09/10/2017   Chest pressure 09/10/2017   Lumbar radiculopathy 03/22/2014   Greater trochanteric bursitis of left hip 03/22/2014   Benign neoplasm of colon 07/04/2013   Diverticulosis of colon (without mention of hemorrhage) 07/04/2013   External hemorrhoids without mention of complication 78/29/5621   Hypothyroidism following radioiodine therapy 06/08/2013   Unspecified constipation 09/19/2012   Sciatica neuralgia 09/06/2012   Multinodular goiter 07/13/2012     REFERRING PROVIDER: Clearance Coots  REFERRING DIAG:  H08.657Q (ICD-10-CM) - Strain of left quadriceps tendon, subsequent encounter  M17.12 (ICD-10-CM) - Primary osteoarthritis of left knee    THERAPY DIAG:  Difficulty in walking,  not elsewhere classified  Acute pain of left knee  Muscle weakness (generalized)  Rationale for Evaluation and Treatment Rehabilitation  ONSET DATE: July 2023  SUBJECTIVE:   SUBJECTIVE STATEMENT: DN made a difference, definitely feels more loose in quads.   PAIN:  Are you having pain? Yes: NPRS scale: 5/10 Pain location: Lt knee Pain description: throb, ache Aggravating factors: wt bearing, wt shifting Relieving factors: ice, rest   PERTINENT HISTORY: Pt had Lt knee injury 20 years ago with patella stabilization surgery  PRECAUTIONS: None  WEIGHT BEARING RESTRICTIONS No  FALLS:  Has patient fallen in last 6 months? Yes. Number of falls 1 with Lt quad tendon injury  LIVING ENVIRONMENT: Lives with: lives with their family Lives in: House/apartment Stairs:  able to live on ground level Has following equipment at home: Single point cane and Crutches  OCCUPATION: on disability due to back issues  PLOF: Independent  PATIENT GOALS improve gait and Lt knee mobility, decrease pain   OBJECTIVE:   DIAGNOSTIC FINDINGS:  Lt knee MRI 1. 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 2. Mild tricompartmental osteoarthritis with a focal full-thickness cartilage defect of the lateral tibial plateau measuring 10 x 3 mm. 3. Small knee joint effusion with two small intra-articular loose bodies. 4. Thickening of the MCL, likely sequela of prior sprain. 5. Intact menisci.  PATIENT SURVEYS:  LEFS 12.5%  COGNITION:  Overall cognitive  status: Within functional limits for tasks assessed     SENSATION: WFL    PALPATION: TTP Lt knee lateral joint line  LOWER EXTREMITY ROM:  Active ROM Right eval Left eval  Hip flexion    Hip extension    Hip abduction    Hip adduction    Hip internal rotation    Hip external rotation    Knee flexion 135 105  Knee extension 0 0  Ankle dorsiflexion    Ankle plantarflexion    Ankle inversion    Ankle eversion     (Blank  rows = not tested)  LOWER EXTREMITY MMT:  MMT Right eval Left eval  Hip flexion 4 3-  Hip extension 4 3-  Hip abduction 4 3-  Hip adduction    Hip internal rotation    Hip external rotation    Knee flexion 4 3  Knee extension 4 3-  Ankle dorsiflexion    Ankle plantarflexion    Ankle inversion    Ankle eversion     (Blank rows = not tested)   FUNCTIONAL TESTS:  Unable to perform SLS on Lt LE due to pain  GAIT: Distance walked: 51' Assistive device utilized: Single point cane Level of assistance: Modified independence Comments: antalgic gait, decreased stance time on Lt, toe walking on Lt    TODAY'S TREATMENT: 01/12/22 Therapeutic Exercise: Nustep L5x58mn Prone quad stretch w/ strap 2x30 sec Fwd L step ups on 4' step x 10 L SLS with opp LE reach to colored dots half circle pattern to R side  4x  Manual Therapy: IASTM s/s tools to L quads, VL focused and ITB  01/05/22 Nustep L5x660m LE only Standing hip flexor stretch left x 30 sec. Seated hip flexor stretch bil  x 30 sec   Church pew x 20 fwd and bwd   Functional squat x 8   STS x 10 with emphasis on slow eccentric control to seat   Seated knee rotation x 20  Manual Therapy: IASTM to left quads, VMO, VLO and ITB Skilled palpation and monitoring of soft tissues during DN Trigger Point Dry-Needling  Treatment instructions: Expect mild to moderate muscle soreness. S/S of pneumothorax if dry needled over a lung field, and to seek immediate medical attention should they occur. Patient verbalized understanding of these instructions and education.  Patient Consent Given: Yes Education handout provided: Yes Muscles treated: left lateral quads Electrical stimulation performed: No Parameters: N/A Treatment response/outcome: Twitch Response Elicited and Palpable Increase in Muscle Length   12/31/21 Nustep L5x6m68mLE only Runner stretch x 30 sec bil Church pew x 20  Retro step x 20 L quad stretch prone with  strap 2x30" Prone HS curls x 10 L LE Prone hip extension 2 x 10 LLE - very challenging STS x 10 with emphasis on slow eccentric control to seat Manual Therapy: STM to L VL, ITB, medial joint line  Gait Training: 90 ft no AD, SBA, cues for heel strike   PATIENT EDUCATION:  Education details: HEP update  Person educated: Patient Education method: Explanation, Demonstration, and Handouts Education comprehension: verbalized understanding and returned demonstration   HOME EXERCISE PROGRAM: Access Code: [email or web address]: [email or web address] Date: 01/05/2022 Prepared by: JulAlmyra Freexercises - Seated Gastroc Stretch with Strap  - 1 x daily - 7 x weekly - 1 sets - 3 reps - 20-30 seconds hold - Supine Heel Slide  - 1 x daily - 7 x weekly - 2 sets - 10 reps - Supine Quad  Set  - 1 x daily - 7 x weekly - 2 sets - 10 reps - 3 seconds hold - Seated Knee Extension AROM  - 1 x daily - 7 x weekly - 2 sets - 10 reps - Side to Side Weight Shift with Counter Support  - 1 x daily - 7 x weekly - 2 sets - 10 reps - Staggered Stance Forward Backward Weight Shift with Counter Support  - 1 x daily - 7 x weekly - 2 sets - 10 reps - Standing Hip Flexion with Counter Support  - 1 x daily - 7 x weekly - 3 sets - 10 reps - Standing Hip Abduction with Counter Support  - 1 x daily - 7 x weekly - 3 sets - 10 reps - Standing Hip Extension with Counter Support  - 1 x daily - 7 x weekly - 3 sets - 10 reps - Standing March with Counter Support  - 1 x daily - 7 x weekly - 3 sets - 10 reps - Prone Quadriceps Stretch with Strap  - 1 x daily - 7 x weekly - 3 sets - 30 sec hold - Prone Hip Extension  - 1 x daily - 7 x weekly - 3 sets - 10 reps - Seated Hip Flexor Stretch  - 2 x daily - 7 x weekly - 1 sets - 3 reps - 30-60 sec hold  ASSESSMENT:  CLINICAL IMPRESSION: Improvements were made with tension along lateral quads since last visit. Still trigger points found along the VL and ITB. She denies any  questions with HEP, now met STG #1. Pt demonstrates decreased stability and confidence standing on LLE alone with exercises. To improve stability with gait and ADLs she would benefit from more work on eccentric quad strengthening and proprioceptive exercises to improve stability.    OBJECTIVE IMPAIRMENTS Abnormal gait, decreased activity tolerance, decreased balance, decreased mobility, difficulty walking, decreased ROM, decreased strength, and pain.   ACTIVITY LIMITATIONS standing, squatting, stairs, and locomotion level  PARTICIPATION LIMITATIONS: community activity and yard work  PERSONAL FACTORS Time since onset of injury/illness/exacerbation are also affecting patient's functional outcome.   REHAB POTENTIAL: Good  CLINICAL DECISION MAKING: Evolving/moderate complexity  EVALUATION COMPLEXITY: Moderate  GOALS: Goals reviewed with patient? Yes  SHORT TERM GOALS: Target date: 01/06/2022   Patient will be independent with initial HEP. Baseline: pt does not have HEP Goal status: MET - 01/12/22   LONG TERM GOALS: Target date: 02/03/2022   Patient will be independent with advanced/ongoing HEP to improve outcomes and carryover.  Baseline: pt does not have HEP Goal status: IN PROGRESS  2.  Patient will report at least 75% improvement in left knee pain to improve QOL. Baseline: pain 6/10 Goal status: IN PROGRESS  3.  Patient will demonstrate improved Left knee AROM to >/= 115-125 deg to allow for normal gait and stair mechanics. Baseline: 105 Goal status: IN PROGRESS  4.  Patient will demonstrate improved functional LE strength as demonstrated by 4+/5 hip and knee strength bilat. Baseline: 3-/5 on Lt, 4/5 Rt Goal status: IN PROGRESS  5.  Patient will be able to ambulate 600' with LRAD and normal gait pattern without increased pain to access community.  Baseline: 57' with SPC with abnormal gait pattern Goal status: IN PROGRESS  6.  Patient will report 50% on LEFS  to  demonstrate improved functional ability. Baseline: 12.5% Goal status: IN PROGRESS     PLAN: PT FREQUENCY: 2x/week  PT DURATION: 6 weeks  PLANNED INTERVENTIONS: Therapeutic exercises, Therapeutic activity, Neuromuscular re-education, Balance training, Gait training, Patient/Family education, Self Care, Joint mobilization, Stair training, DME instructions, Aquatic Therapy, Dry Needling, Cryotherapy, Moist heat, Taping, Manual therapy, and Re-evaluation  PLAN FOR NEXT SESSION: STM and continue as indicated,  continue to work on gait, hip and knee strength, ROM and balance.    Artist Pais, PTA 01/12/2022, 2:01 PM

## 2022-01-14 ENCOUNTER — Ambulatory Visit: Payer: Commercial Managed Care - HMO | Admitting: Physical Therapy

## 2022-01-14 DIAGNOSIS — M25562 Pain in left knee: Secondary | ICD-10-CM

## 2022-01-14 DIAGNOSIS — M6281 Muscle weakness (generalized): Secondary | ICD-10-CM

## 2022-01-14 DIAGNOSIS — R262 Difficulty in walking, not elsewhere classified: Secondary | ICD-10-CM

## 2022-01-14 NOTE — Therapy (Signed)
OUTPATIENT PHYSICAL THERAPY TREATMENT   Patient Name: Kimberly Hanson MRN: [id number] DOB:1966/01/30, 56 y.o., female Today's Date: 01/14/2022   PT End of Session - 01/14/22 1319     Visit Number 6    Number of Visits 12    Date for PT Re-Evaluation 02/03/22    Authorization Type [place] [employer]    Authorization Time Period 12/29/21 - 06/26/21    Authorization - Visit Number 5    Authorization - Number of Visits 27   (108 units)   PT Start Time 1319    PT Stop Time 1402    PT Time Calculation (min) 43 min    Activity Tolerance Patient tolerated treatment well    Behavior During Therapy [facility] for tasks assessed/performed                Past Medical History:  Diagnosis Date   Cholelithiasis    On CT 06/26/13   Diverticulosis    On CT 06/26/13   Hyperthyroidism    Multinodular goiter    Seizures ([place])    Past Surgical History:  Procedure Laterality Date   ABDOMINAL HYSTERECTOMY  2002   CESAREAN SECTION     x 2   COLONOSCOPY N/A 07/04/2013   Procedure: COLONOSCOPY;  Surgeon: Milus Banister, MD;  Location: [facility];  Service: Endoscopy;  Laterality: N/A;   Patient Active Problem List   Diagnosis Date Noted   OA (osteoarthritis) of knee 11/11/2021   Strain of left quadriceps tendon 09/30/2021   Sprain of medial collateral ligament of left knee 09/30/2021   Chest pain    Bradycardia 09/10/2017   Chest pressure 09/10/2017   Lumbar radiculopathy 03/22/2014   Greater trochanteric bursitis of left hip 03/22/2014   Benign neoplasm of colon 07/04/2013   Diverticulosis of colon (without mention of hemorrhage) 07/04/2013   External hemorrhoids without mention of complication 69/67/8938   Hypothyroidism following radioiodine therapy 06/08/2013   Unspecified constipation 09/19/2012   Sciatica neuralgia 09/06/2012   Multinodular goiter 07/13/2012     REFERRING PROVIDER: Clearance Coots  REFERRING DIAG:  B01.751W (ICD-10-CM) - Strain of left quadriceps tendon,  subsequent encounter  M17.12 (ICD-10-CM) - Primary osteoarthritis of left knee    THERAPY DIAG:  Difficulty in walking, not elsewhere classified  Acute pain of left knee  Muscle weakness (generalized)  Rationale for Evaluation and Treatment Rehabilitation  ONSET DATE: July 2023  SUBJECTIVE:   SUBJECTIVE STATEMENT: Pt reports she is better able to bear weight on her L LE now but still needs to work on her walking.  PAIN:  Are you having pain? Yes: NPRS scale: 4/10 Pain location: Lt knee Pain description: throb, ache Aggravating factors: wt bearing, wt shifting Relieving factors: ice, rest   PERTINENT HISTORY: Pt had Lt knee injury 20 years ago with patella stabilization surgery  PRECAUTIONS: None  WEIGHT BEARING RESTRICTIONS No  FALLS:  Has patient fallen in last 6 months? Yes. Number of falls 1 with Lt quad tendon injury  LIVING ENVIRONMENT: Lives with: lives with their family Lives in: House/apartment Stairs:  able to live on ground level Has following equipment at home: Single point cane and Crutches  OCCUPATION: on disability due to back issues  PLOF: Independent  PATIENT GOALS improve gait and Lt knee mobility, decrease pain   OBJECTIVE:   DIAGNOSTIC FINDINGS:  Lt knee MRI 1. 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 2. Mild tricompartmental osteoarthritis with a focal full-thickness cartilage defect of the lateral tibial plateau measuring 10 x  3 mm. 3. Small knee joint effusion with two small intra-articular loose bodies. 4. Thickening of the MCL, likely sequela of prior sprain. 5. Intact menisci.  PATIENT SURVEYS:  LEFS 12.5%  COGNITION:  Overall cognitive status: Within functional limits for tasks assessed     SENSATION: WFL  PALPATION: TTP Lt knee lateral joint line  LOWER EXTREMITY ROM:  Active ROM Right eval Left eval  Hip flexion    Hip extension    Hip abduction    Hip adduction    Hip internal rotation    Hip  external rotation    Knee flexion 135 105  Knee extension 0 0  Ankle dorsiflexion    Ankle plantarflexion    Ankle inversion    Ankle eversion     (Blank rows = not tested)  LOWER EXTREMITY MMT:  MMT Right eval Left eval  Hip flexion 4 3-  Hip extension 4 3-  Hip abduction 4 3-  Hip adduction    Hip internal rotation    Hip external rotation    Knee flexion 4 3  Knee extension 4 3-  Ankle dorsiflexion    Ankle plantarflexion    Ankle inversion    Ankle eversion     (Blank rows = not tested)   FUNCTIONAL TESTS:  Unable to perform SLS on Lt LE due to pain  GAIT: Distance walked: 31' Assistive device utilized: Single point cane Level of assistance: Modified independence Comments: antalgic gait, decreased stance time on Lt, toe walking on Lt    TODAY'S TREATMENT:  01/14/22 THERAPEUTIC EXERCISE: to improve flexibility, strength and mobility.  Verbal and tactile cues throughout for technique. Rec bike - L1 x 6 min Prone L quad stretch with strap and towel roll under distal thigh 3 x 30" Mod thomas L quad/hip flexor stretch 3 x 30" Church pews x 20  MANUAL THERAPY: To promote normalized muscle tension, improved flexibility, and reduced pain. Skilled palpation and monitoring of soft tissue during DN Trigger Point Dry-Needling  Treatment instructions: Expect mild to moderate muscle soreness. Patient verbalized understanding of these instructions and education. Patient Consent Given: Yes Education handout provided: Previously provided Muscles treated: L hip adductors, L VMO, L mid/proximal VI/RF and VL Electrical stimulation performed: Yes Parameters:  milli-A, freq 40, intensity to elicit mild twitch - L mid/proximal VI/RF and VL Treatment response/outcome: Twitch Response Elicited and Palpable Increase in Muscle Length STM/DTM, manual TPR and pin & stretch to muscles addressed with DN   01/12/22 Therapeutic Exercise: Nustep L5x77mn Prone quad stretch w/ strap  2x30 sec Fwd L step ups on 4' step x 10 L SLS with opp LE reach to colored dots half circle pattern to R side  4x  Manual Therapy: IASTM s/s tools to L quads, VL focused and ITB   01/05/22 Nustep L5x624m LE only Standing hip flexor stretch left x 30 sec. Seated hip flexor stretch bil  x 30 sec   Church pew x 20 fwd and bwd   Functional squat x 8   STS x 10 with emphasis on slow eccentric control to seat   Seated knee rotation x 20  Manual Therapy: IASTM to left quads, VMO, VLO and ITB Skilled palpation and monitoring of soft tissues during DN Trigger Point Dry-Needling  Treatment instructions: Expect mild to moderate muscle soreness. S/S of pneumothorax if dry needled over a lung field, and to seek immediate medical attention should they occur. Patient verbalized understanding of these instructions and education.  Patient Consent  Given: Yes Education handout provided: Yes Muscles treated: left lateral quads Electrical stimulation performed: No Parameters: N/A Treatment response/outcome: Twitch Response Elicited and Palpable Increase in Muscle Length   PATIENT EDUCATION:  Education details: HEP update Person educated: Patient Education method: Explanation, Demonstration, and Handouts Education comprehension: verbalized understanding and returned demonstration   HOME EXERCISE PROGRAM: Access Code: [email or web address] URL: [email or web address] Date: 01/05/2022 Prepared by: Almyra Free  Exercises - Seated Gastroc Stretch with Strap  - 1 x daily - 7 x weekly - 1 sets - 3 reps - 20-30 seconds hold - Supine Heel Slide  - 1 x daily - 7 x weekly - 2 sets - 10 reps - Supine Quad Set  - 1 x daily - 7 x weekly - 2 sets - 10 reps - 3 seconds hold - Seated Knee Extension AROM  - 1 x daily - 7 x weekly - 2 sets - 10 reps - Side to Side Weight Shift with Counter Support  - 1 x daily - 7 x weekly - 2 sets - 10 reps - Staggered Stance Forward Backward Weight Shift with Counter Support  -  1 x daily - 7 x weekly - 2 sets - 10 reps - Standing Hip Flexion with Counter Support  - 1 x daily - 7 x weekly - 3 sets - 10 reps - Standing Hip Abduction with Counter Support  - 1 x daily - 7 x weekly - 3 sets - 10 reps - Standing Hip Extension with Counter Support  - 1 x daily - 7 x weekly - 3 sets - 10 reps - Standing March with Counter Support  - 1 x daily - 7 x weekly - 3 sets - 10 reps - Prone Quadriceps Stretch with Strap  - 1 x daily - 7 x weekly - 3 sets - 30 sec hold - Prone Hip Extension  - 1 x daily - 7 x weekly - 3 sets - 10 reps - Seated Hip Flexor Stretch  - 2 x daily - 7 x weekly - 1 sets - 3 reps - 30-60 sec hold   ASSESSMENT:  CLINICAL IMPRESSION: [employer]" feels that PT has been helping - better tolerance for weight bearing on L. She feels that she is better tolerating the HEP and has started increasing reps but does not feel ready to add additional resistance to exercises. She notes continue taut bands and crepitus in her L anterior and medial thigh, with benefit noted from previous DN. Proceeded with additional MT and DN today targeting L hip adductors as well as all quad muscles. Pt noting better feeling of quad and hip flexor stretch following DN and able to evenly bear weight in B LE w/o need for cane. Beth will continue to benefit from skilled PT to restore normal muscle tension and functional strength and balance in her LEs to allow resumption of normal daily mobility and gait.  OBJECTIVE IMPAIRMENTS Abnormal gait, decreased activity tolerance, decreased balance, decreased mobility, difficulty walking, decreased ROM, decreased strength, and pain.   ACTIVITY LIMITATIONS standing, squatting, stairs, and locomotion level  PARTICIPATION LIMITATIONS: community activity and yard work  PERSONAL FACTORS Time since onset of injury/illness/exacerbation are also affecting patient's functional outcome.   REHAB POTENTIAL: Good  CLINICAL DECISION MAKING: Evolving/moderate  complexity  EVALUATION COMPLEXITY: Moderate  GOALS: Goals reviewed with patient? Yes  SHORT TERM GOALS: Target date: 01/06/2022   Patient will be independent with initial HEP. Baseline: pt does not have HEP Goal status: MET - 01/12/22  LONG TERM GOALS: Target date: 02/03/2022   Patient will be independent with advanced/ongoing HEP to improve outcomes and carryover.  Baseline: pt does not have HEP Goal status: IN PROGRESS  2.  Patient will report at least 75% improvement in left knee pain to improve QOL. Baseline: pain 6/10 Goal status: IN PROGRESS  3.  Patient will demonstrate improved Left knee AROM to >/= 115-125 deg to allow for normal gait and stair mechanics. Baseline: 105 Goal status: IN PROGRESS  4.  Patient will demonstrate improved functional LE strength as demonstrated by 4+/5 hip and knee strength bilat. Baseline: 3-/5 on Lt, 4/5 Rt Goal status: IN PROGRESS  5.  Patient will be able to ambulate 600' with LRAD and normal gait pattern without increased pain to access community.  Baseline: 64' with SPC with abnormal gait pattern Goal status: IN PROGRESS  6.  Patient will report 50% on LEFS  to demonstrate improved functional ability. Baseline: 12.5% Goal status: IN PROGRESS   PLAN: PT FREQUENCY: 2x/week  PT DURATION: 6 weeks  PLANNED INTERVENTIONS: Therapeutic exercises, Therapeutic activity, Neuromuscular re-education, Balance training, Gait training, Patient/Family education, Self Care, Joint mobilization, Stair training, DME instructions, Aquatic Therapy, Dry Needling, Cryotherapy, Moist heat, Taping, Manual therapy, and Re-evaluation  PLAN FOR NEXT SESSION: MT +/- DN as indicated,  continue to work on gait, hip and knee strength, ROM and balance.    Percival Spanish, PT 01/14/2022, 4:08 PM

## 2022-01-18 NOTE — Therapy (Signed)
OUTPATIENT PHYSICAL THERAPY TREATMENT   Patient Name: Kimberly Hanson MRN: [id number] DOB:Aug 14, 1965, 56 y.o., female Today's Date: 01/19/2022   PT End of Session - 01/19/22 1313     Visit Number 7    Number of Visits 12    Authorization Type [place] Medicaid [employer]    Authorization Time Period 12/29/21 - 06/26/21    Authorization - Visit Number 6    Authorization - Number of Visits 27    PT Start Time 1309    Activity Tolerance Patient tolerated treatment well    Behavior During Therapy [facility] for tasks assessed/performed                 Past Medical History:  Diagnosis Date   Cholelithiasis    On CT 06/26/13   Diverticulosis    On CT 06/26/13   Hyperthyroidism    Multinodular goiter    Seizures ([place])    Past Surgical History:  Procedure Laterality Date   ABDOMINAL HYSTERECTOMY  2002   CESAREAN SECTION     x 2   COLONOSCOPY N/A 07/04/2013   Procedure: COLONOSCOPY;  Surgeon: Milus Banister, MD;  Location: [facility];  Service: Endoscopy;  Laterality: N/A;   Patient Active Problem List   Diagnosis Date Noted   OA (osteoarthritis) of knee 11/11/2021   Strain of left quadriceps tendon 09/30/2021   Sprain of medial collateral ligament of left knee 09/30/2021   Chest pain    Bradycardia 09/10/2017   Chest pressure 09/10/2017   Lumbar radiculopathy 03/22/2014   Greater trochanteric bursitis of left hip 03/22/2014   Benign neoplasm of colon 07/04/2013   Diverticulosis of colon (without mention of hemorrhage) 07/04/2013   External hemorrhoids without mention of complication 40/98/1191   Hypothyroidism following radioiodine therapy 06/08/2013   Unspecified constipation 09/19/2012   Sciatica neuralgia 09/06/2012   Multinodular goiter 07/13/2012     REFERRING PROVIDER: Clearance Coots  REFERRING DIAG:  Y78.295A (ICD-10-CM) - Strain of left quadriceps tendon, subsequent encounter  M17.12 (ICD-10-CM) - Primary osteoarthritis of left knee    THERAPY DIAG:   Difficulty in walking, not elsewhere classified  Acute pain of left knee  Muscle weakness (generalized)  Rationale for Evaluation and Treatment Rehabilitation  ONSET DATE: July 2023  SUBJECTIVE:   SUBJECTIVE STATEMENT:  My leg feels so much better. No throbbing today.  PAIN:  Are you having pain? Yes: NPRS scale: 2-3/10 Pain location: Lt knee Pain description: throb, ache Aggravating factors: wt bearing, wt shifting Relieving factors: ice, rest   PERTINENT HISTORY: Pt had Lt knee injury 20 years ago with patella stabilization surgery  PRECAUTIONS: None  WEIGHT BEARING RESTRICTIONS No  FALLS:  Has patient fallen in last 6 months? Yes. Number of falls 1 with Lt quad tendon injury  LIVING ENVIRONMENT: Lives with: lives with their family Lives in: House/apartment Stairs:  able to live on ground level Has following equipment at home: Single point cane and Crutches  OCCUPATION: on disability due to back issues  PLOF: Independent  PATIENT GOALS improve gait and Lt knee mobility, decrease pain   OBJECTIVE:   DIAGNOSTIC FINDINGS:  Lt knee MRI 1. 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 2. Mild tricompartmental osteoarthritis with a focal full-thickness cartilage defect of the lateral tibial plateau measuring 10 x 3 mm. 3. Small knee joint effusion with two small intra-articular loose bodies. 4. Thickening of the MCL, likely sequela of prior sprain. 5. Intact menisci.  PATIENT SURVEYS:  LEFS 12.5%  COGNITION:  Overall cognitive status:  Within functional limits for tasks assessed     SENSATION: WFL  PALPATION: TTP Lt knee lateral joint line  LOWER EXTREMITY ROM:  Active ROM Right eval Left eval Left 1017/23  Knee flexion 135 105 116  Knee extension 0 0 0   (Blank rows = not tested)  LOWER EXTREMITY MMT:  MMT Right eval Left eval Left 1017/23  Hip flexion 4 3- 4-  Hip extension 4 3- 3+  Hip abduction 4 3- 4  Hip adduction   2+   Hip internal rotation     Hip external rotation     Knee flexion 4 3 4-  Knee extension 4 3- 4   (Blank rows = not tested)   FUNCTIONAL TESTS:  Unable to perform SLS on Lt LE due to pain  GAIT: Distance walked: 30' Assistive device utilized: Single point cane Level of assistance: Modified independence Comments: antalgic gait, decreased stance time on Lt, toe walking on Lt    TODAY'S TREATMENT:   01/19/22 THERAPEUTIC EXERCISE: to improve flexibility, strength and mobility.  Verbal and tactile cues throughout for technique. Nustep  - L5 x 6 min SLR x 10 Left Prone quad set x 10 5 sec hold Prone HS curl x 10, 3# x 10 Prone hip ext x 10 Bridge x 10 Supine hip ADDuction with ball x 10 and 5 sec hold Supine knee flexion with feet on peanut x 10 Seated hip ADDuction with ball x 10  Seated HS stretch x 20 sec for cramp     01/14/22 THERAPEUTIC EXERCISE: to improve flexibility, strength and mobility.  Verbal and tactile cues throughout for technique. Rec bike - L1 x 6 min Prone L quad stretch with strap and towel roll under distal thigh 3 x 30" Mod thomas L quad/hip flexor stretch 3 x 30" [employer] pews x 20  MANUAL THERAPY: To promote normalized muscle tension, improved flexibility, and reduced pain. Skilled palpation and monitoring of soft tissue during DN Trigger Point Dry-Needling  Treatment instructions: Expect mild to moderate muscle soreness. Patient verbalized understanding of these instructions and education. Patient Consent Given: Yes Education handout provided: Previously provided Muscles treated: L hip adductors, L VMO, L mid/proximal VI/RF and VL Electrical stimulation performed: Yes Parameters:  milli-A, freq 40, intensity to elicit mild twitch - L mid/proximal VI/RF and VL Treatment response/outcome: Twitch Response Elicited and Palpable Increase in Muscle Length STM/DTM, manual TPR and pin & stretch to muscles addressed with DN   01/12/22 Therapeutic  Exercise: Nustep L5x55mn Prone quad stretch w/ strap 2x30 sec Fwd L step ups on 4' step x 10 L SLS with opp LE reach to colored dots half circle pattern to R side  4x  Manual Therapy: IASTM s/s tools to L quads, VL focused and ITB   01/05/22 Nustep L5x645m LE only Standing hip flexor stretch left x 30 sec. Seated hip flexor stretch bil  x 30 sec   Church pew x 20 fwd and bwd   Functional squat x 8   STS x 10 with emphasis on slow eccentric control to seat   Seated knee rotation x 20  Manual Therapy: IASTM to left quads, VMO, VLO and ITB Skilled palpation and monitoring of soft tissues during DN Trigger Point Dry-Needling  Treatment instructions: Expect mild to moderate muscle soreness. S/S of pneumothorax if dry needled over a lung field, and to seek immediate medical attention should they occur. Patient verbalized understanding of these instructions and education.  Patient Consent Given: Yes Education  handout provided: Yes Muscles treated: left lateral quads Electrical stimulation performed: No Parameters: N/A Treatment response/outcome: Twitch Response Elicited and Palpable Increase in Muscle Length   PATIENT EDUCATION:  Education details: HEP update10/17/23 Person educated: Patient Education method: Explanation, Demonstration, and Handouts Education comprehension: verbalized understanding and returned demonstration   HOME EXERCISE PROGRAM: Access Code: [email or web address] URL: [email or web address] Date: 01/19/2022 Prepared by: Almyra Free  Exercises - Seated Gastroc Stretch with Strap  - 1 x daily - 7 x weekly - 1 sets - 3 reps - 20-30 seconds hold - Supine Heel Slide  - 1 x daily - 7 x weekly - 2 sets - 10 reps - Supine Quad Set  - 1 x daily - 7 x weekly - 2 sets - 10 reps - 3 seconds hold - Seated Knee Extension AROM  - 1 x daily - 7 x weekly - 2 sets - 10 reps - Side to Side Weight Shift with Counter Support  - 1 x daily - 7 x weekly - 2 sets - 10 reps -  Staggered Stance Forward Backward Weight Shift with Counter Support  - 1 x daily - 7 x weekly - 2 sets - 10 reps - Standing Hip Flexion with Counter Support  - 1 x daily - 7 x weekly - 3 sets - 10 reps - Standing Hip Abduction with Counter Support  - 1 x daily - 7 x weekly - 3 sets - 10 reps - Standing Hip Extension with Counter Support  - 1 x daily - 7 x weekly - 3 sets - 10 reps - Standing March with Counter Support  - 1 x daily - 7 x weekly - 3 sets - 10 reps - Prone Quadriceps Stretch with Strap  - 1 x daily - 7 x weekly - 3 sets - 30 sec hold - Prone Hip Extension  - 1 x daily - 7 x weekly - 3 sets - 10 reps - Seated Hip Flexor Stretch  - 2 x daily - 7 x weekly - 1 sets - 3 reps - 30-60 sec hold - Prone Knee Flexion AROM  - 1 x daily - 7 x weekly - 3 sets - 10 reps - Supine Bridge  - 2 x daily - 7 x weekly - 1-3 sets - 10 reps - Supine Hip Adduction Isometric with Ball  - 1 x daily - 7 x weekly - 3 sets - 10 reps - Supine Active Straight Leg Raise  - 2 x daily - 7 x weekly - 3 sets - 10 reps  CLINICAL IMPRESSION: [employer]" is progressing well with her left knee flexion and with strengthening as well as pain control. She continues to demonstrate significant weakness in the left LE, but she could tolerate more exercises today. She reports significant improvement in pain since last visit. Beth continues to demonstrate potential for improvement and would benefit from continued skilled therapy to address impairments.    OBJECTIVE IMPAIRMENTS Abnormal gait, decreased activity tolerance, decreased balance, decreased mobility, difficulty walking, decreased ROM, decreased strength, and pain.   ACTIVITY LIMITATIONS standing, squatting, stairs, and locomotion level  PARTICIPATION LIMITATIONS: community activity and yard work  PERSONAL FACTORS Time since onset of injury/illness/exacerbation are also affecting patient's functional outcome.   REHAB POTENTIAL: Good  CLINICAL DECISION MAKING:  Evolving/moderate complexity  EVALUATION COMPLEXITY: Moderate  GOALS: Goals reviewed with patient? Yes  SHORT TERM GOALS: Target date: 01/06/2022   Patient will be independent with initial HEP. Baseline: pt does not have HEP  Goal status: MET - 01/12/22   LONG TERM GOALS: Target date: 02/03/2022   Patient will be independent with advanced/ongoing HEP to improve outcomes and carryover.  Baseline: pt does not have HEP Goal status: IN PROGRESS  2.  Patient will report at least 75% improvement in left knee pain to improve QOL. Baseline: pain 6/10 Goal status: IN PROGRESS  3.  Patient will demonstrate improved Left knee AROM to >/= 115-125 deg to allow for normal gait and stair mechanics. Baseline: 105 Goal status: IN PROGRESS  4.  Patient will demonstrate improved functional LE strength as demonstrated by 4+/5 hip and knee strength bilat. Baseline: 3-/5 on Lt, 4/5 Rt Goal status: IN PROGRESS  5.  Patient will be able to ambulate 600' with LRAD and normal gait pattern without increased pain to access community.  Baseline: 79' with SPC with abnormal gait pattern Goal status: IN PROGRESS  6.  Patient will report 50% on LEFS  to demonstrate improved functional ability. Baseline: 12.5% Goal status: IN PROGRESS   PLAN: PT FREQUENCY: 2x/week  PT DURATION: 6 weeks  PLANNED INTERVENTIONS: Therapeutic exercises, Therapeutic activity, Neuromuscular re-education, Balance training, Gait training, Patient/Family education, Self Care, Joint mobilization, Stair training, DME instructions, Aquatic Therapy, Dry Needling, Cryotherapy, Moist heat, Taping, Manual therapy, and Re-evaluation  PLAN FOR NEXT SESSION: continue strengthening as tolerated,  MT +/- DN as indicated,  continue to work on gait, ROM and balance.    Rily Nickey, PT 01/19/2022, 1:14 PM

## 2022-01-19 ENCOUNTER — Encounter: Payer: Self-pay | Admitting: Physical Therapy

## 2022-01-19 ENCOUNTER — Ambulatory Visit: Payer: Commercial Managed Care - HMO | Admitting: Physical Therapy

## 2022-01-19 DIAGNOSIS — M6281 Muscle weakness (generalized): Secondary | ICD-10-CM

## 2022-01-19 DIAGNOSIS — M25562 Pain in left knee: Secondary | ICD-10-CM

## 2022-01-19 DIAGNOSIS — R262 Difficulty in walking, not elsewhere classified: Secondary | ICD-10-CM | POA: Diagnosis not present

## 2022-01-21 NOTE — Telephone Encounter (Signed)
Patient's new insurance is [employer], I submitted a new request for durolane/gelsyn. Uploaded the last office note. Will check status tomorrow for update and will enter it here.

## 2022-01-22 NOTE — Telephone Encounter (Signed)
Faxed precert and clinicals to [place] at [phone].

## 2022-01-22 NOTE — Telephone Encounter (Signed)
Pt informed of below.    Patient also has [place] MEDICA[employer] CARITAS OF . Re- ran benefits with this secondary added. Will hold for new benefit summary.

## 2022-01-22 NOTE — Telephone Encounter (Signed)
Update on portal:  Patient has a [employer] [place] with an effective date of 11/03/2021. Plan follow [employer] Guidelines. DUROLANE D8006 and administration 20610/20611 are covered at 75% of allowable amount. Deductibles do not apply to these services. Specialist office visits if billed are covered at 100% of the allowable after a $10 copay. If out of pocket is met, coverage goes to 100% and copay will no longer apply. A referral is required from PCP, please contact patient for PCP's information. This must be submitted with the claim. Medical notes must be submitted with the claim. Include medical necessity, diagnosis, and all clinicals. Durolane is a preferred drug with [employer]. We apologize. This patient's plan does not allow third parties to complete a pre-cert on your behalf. To obtain pre-cert, please contact [phone]. Office also has option to fax the authorization form and clinicals to [phone] or to submit online via [email or web address]. As per Rep Dr. Clearance Coots is in network with this provided [id number]. Practice is showing out of network under [id number]. Ref# [id number]  [name], I will complete precert later today. Someone will need to contact patient to get the referral from patient's PCP.

## 2022-01-25 NOTE — Telephone Encounter (Signed)
Called [employer] to check the status of PA. They do not have information on it as of yet. They said it could take a few days. They did give me a different fax number just in case. So I sent the PA again with last office note and also added the imaging. I made a correction to the office address as there was a typo.  I called Medicaid [employer] of [place] [employer] [place]. The rep looked up the two codes [id number] and [phone] and said those are not a covered service by this plan. I asked if she could tell me what is covered and she said they only looked it up as a courtesy. She directed me to the website but the website I could not find the information.

## 2022-01-26 ENCOUNTER — Ambulatory Visit: Payer: Commercial Managed Care - HMO | Admitting: Physical Therapy

## 2022-01-28 ENCOUNTER — Ambulatory Visit: Payer: Commercial Managed Care - HMO

## 2022-02-02 ENCOUNTER — Ambulatory Visit: Payer: Commercial Managed Care - HMO | Admitting: Physical Therapy

## 2022-02-03 NOTE — Therapy (Addendum)
OUTPATIENT PHYSICAL THERAPY TREATMENT / RECERT / [email or web address] SUMMARY   Patient Name: Kimberly Hanson MRN: [id number] DOB:18-Apr-1965, 56 y.o., female Today's Date: 02/04/2022   PT End of Session - 02/04/22 1305     Visit Number 8    Number of Visits 12    Date for PT Re-Evaluation 03/18/22    Authorization Type [place] Medicaid [employer]    Authorization Time Period 12/29/21 - 06/27/22    Authorization - Visit Number 7    Authorization - Number of Visits 27    PT Start Time 4098    PT Stop Time 1191    PT Time Calculation (min) 48 min    Activity Tolerance Patient tolerated treatment well    Behavior During Therapy [facility] for tasks assessed/performed                  Past Medical History:  Diagnosis Date   Cholelithiasis    On CT 06/26/13   Diverticulosis    On CT 06/26/13   Hyperthyroidism    Multinodular goiter    Seizures ([place])    Past Surgical History:  Procedure Laterality Date   ABDOMINAL HYSTERECTOMY  2002   CESAREAN SECTION     x 2   COLONOSCOPY N/A 07/04/2013   Procedure: COLONOSCOPY;  Surgeon: Milus Banister, MD;  Location: [facility];  Service: Endoscopy;  Laterality: N/A;   Patient Active Problem List   Diagnosis Date Noted   OA (osteoarthritis) of knee 11/11/2021   Strain of left quadriceps tendon 09/30/2021   Sprain of medial collateral ligament of left knee 09/30/2021   Chest pain    Bradycardia 09/10/2017   Chest pressure 09/10/2017   Lumbar radiculopathy 03/22/2014   Greater trochanteric bursitis of left hip 03/22/2014   Benign neoplasm of colon 07/04/2013   Diverticulosis of colon (without mention of hemorrhage) 07/04/2013   External hemorrhoids without mention of complication 47/82/9562   Hypothyroidism following radioiodine therapy 06/08/2013   Unspecified constipation 09/19/2012   Sciatica neuralgia 09/06/2012   Multinodular goiter 07/13/2012     REFERRING PROVIDER: Clearance Coots  REFERRING DIAG:  Z30.865H (ICD-10-CM) - Strain of left  quadriceps tendon, subsequent encounter  M17.12 (ICD-10-CM) - Primary osteoarthritis of left knee    THERAPY DIAG:  Difficulty in walking, not elsewhere classified  Acute pain of left knee  Muscle weakness (generalized)  Rationale for Evaluation and Treatment Rehabilitation  ONSET DATE: July 2023  SUBJECTIVE:   SUBJECTIVE STATEMENT:  I'm doing better with my balance. Able to walk short distances without the cane but need it for the mall or bigger grocery stores. Pain still increases with pushing through (about 5/10). I had a slight fall 3 days ago getting out of the shower. Had to step over the tub. Led with R leg and when brought L leg down it buckled and R leg went out. So now sore. Did not land on L knee.  PAIN:  Are you having pain? Yes: NPRS scale: 2-5/10 Pain location: Lt knee Pain description: throb, ache Aggravating factors: wt bearing, wt shifting Relieving factors: ice, rest   PERTINENT HISTORY: Pt had Lt knee injury 20 years ago with patella stabilization surgery  PRECAUTIONS: None  WEIGHT BEARING RESTRICTIONS No  FALLS:  Has patient fallen in last 6 months? Yes. Number of falls 1 with Lt quad tendon injury  LIVING ENVIRONMENT: Lives with: lives with their family Lives in: House/apartment Stairs:  able to live on ground level Has following equipment at home: Single point cane  and Crutches  OCCUPATION: on disability due to back issues  PLOF: Independent  PATIENT GOALS improve gait and Lt knee mobility, decrease pain   OBJECTIVE:   DIAGNOSTIC FINDINGS:  Lt knee MRI 1. 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 2. Mild tricompartmental osteoarthritis with a focal full-thickness cartilage defect of the lateral tibial plateau measuring 10 x 3 mm. 3. Small knee joint effusion with two small intra-articular loose bodies. 4. Thickening of the MCL, likely sequela of prior sprain. 5. Intact menisci.  PATIENT SURVEYS:  LEFS 12.5% LEFS:17.5%   02/04/22  COGNITION:  Overall cognitive status: Within functional limits for tasks assessed     SENSATION: WFL  PALPATION: TTP Lt knee lateral joint line  LOWER EXTREMITY ROM:  Active ROM Right eval Left eval Left 1017/23 Left 02/04/22  Knee flexion 135 105 116 115  Knee extension 0 0 0 0   (Blank rows = not tested)  LOWER EXTREMITY MMT:  MMT Right eval Left eval Left 1017/23 Left  02/04/22 Right 02/04/22  Hip flexion 4 3- 4- 4- 5  Hip extension 4 3- 3+ 4- 4+  Hip abduction 4 3- _0 Hip adduction   2+ 4+ 5  Hip internal rotation       Hip external rotation       Knee flexion 4 3 4- 4- 4+  Knee extension 4 3- 4 4 4+   (Blank rows = not tested)   FUNCTIONAL TESTS:  Unable to perform SLS on Lt LE due to pain  GAIT: Distance walked: 52' Assistive device utilized: Single point cane Level of assistance: Modified independence Comments: antalgic gait, decreased stance time on Lt, toe  walking on Lt    02/04/22 Distance walked: 300 Assistive device utilized: None Level of assistance: Modified independence - slower speed Comments: antalgic gait  TODAY'S TREATMENT: 02/04/22 THERAPEUTIC ACTIVITIES: MMT, ROM, LEFS  THERAPEUTIC EXERCISE: to improve flexibility, strength and mobility.  Verbal and tactile cues throughout for technique. Nustep  - L6 x 7 min BATCA: Bil Knee ext 5# 2x10 BATCA: Bil knee flex 5# 2x10 Step ups 2 in and 4 in x 10 ea L Lateral step up 4 in 1x15 with one UE support Seated march L x 15 Supine SAQ L 3x10  Gait: 300 feet without AD - one LOB, one short rest break and had to stop due to increased pain  01/19/22 THERAPEUTIC EXERCISE: to improve flexibility, strength and mobility.  Verbal and tactile cues throughout for technique. Nustep  - L5 x 6 min SLR x 10 Left Prone quad set x 10 5 sec hold Prone HS curl x 10, 3# x 10 Prone hip ext x 10 Bridge x 10 Supine hip ADDuction with ball x 10 and 5 sec hold Supine knee flexion with feet  on peanut x 10 Seated hip ADDuction with ball x 10  Seated HS stretch x 20 sec for cramp   01/14/22 THERAPEUTIC EXERCISE: to improve flexibility, strength and mobility.  Verbal and tactile cues throughout for technique. Rec bike - L1 x 6 min Prone L quad stretch with strap and towel roll under distal thigh 3 x 30" Mod thomas L quad/hip flexor stretch 3 x 30" [employer] pews x 20  MANUAL THERAPY: To promote normalized muscle tension, improved flexibility, and reduced pain. Skilled palpation and monitoring of soft tissue during DN Trigger Point Dry-Needling  Treatment instructions: Expect mild to moderate muscle soreness. Patient verbalized understanding of these instructions and education. Patient Consent Given: Yes  Education handout provided: Previously provided Muscles treated: L hip adductors, L VMO, L mid/proximal VI/RF and VL Electrical stimulation performed: Yes Parameters:  milli-A, freq 40, intensity to elicit mild twitch - L mid/proximal VI/RF and VL Treatment response/outcome: Twitch Response Elicited and Palpable Increase in Muscle Length STM/DTM, manual TPR and pin & stretch to muscles addressed with DN     PATIENT EDUCATION:  Education details: HEP update Person educated: Patient Education method: Explanation, Demonstration, and Handouts Education comprehension: verbalized understanding and returned demonstration   HOME EXERCISE PROGRAM: Access Code: [email or web address] URL: [email or web address] Date: 02/04/2022 Prepared by: Almyra Free  Exercises - Seated Gastroc Stretch with Strap  - 1 x daily - 7 x weekly - 1 sets - 3 reps - 20-30 seconds hold - Supine Heel Slide  - 1 x daily - 7 x weekly - 2 sets - 10 reps - Supine Quad Set  - 1 x daily - 7 x weekly - 2 sets - 10 reps - 3 seconds hold - Seated Knee Extension AROM  - 1 x daily - 7 x weekly - 2 sets - 10 reps - Side to Side Weight Shift with Counter Support  - 1 x daily - 7 x weekly - 2 sets - 10 reps - Staggered  Stance Forward Backward Weight Shift with Counter Support  - 1 x daily - 7 x weekly - 2 sets - 10 reps - Standing Hip Flexion with Counter Support  - 1 x daily - 7 x weekly - 3 sets - 10 reps - Standing Hip Abduction with Counter Support  - 1 x daily - 7 x weekly - 3 sets - 10 reps - Standing Hip Extension with Counter Support  - 1 x daily - 7 x weekly - 3 sets - 10 reps - Standing March with Counter Support  - 1 x daily - 7 x weekly - 3 sets - 10 reps - Prone Quadriceps Stretch with Strap  - 1 x daily - 7 x weekly - 3 sets - 30 sec hold - Prone Hip Extension  - 1 x daily - 7 x weekly - 3 sets - 10 reps - Seated Hip Flexor Stretch  - 2 x daily - 7 x weekly - 1 sets - 3 reps - 30-60 sec hold - Prone Knee Flexion AROM  - 1 x daily - 7 x weekly - 3 sets - 10 reps - Supine Bridge  - 2 x daily - 7 x weekly - 1-3 sets - 10 reps - Supine Hip Adduction Isometric with Ball  - 1 x daily - 7 x weekly - 3 sets - 10 reps - Supine Active Straight Leg Raise  - 2 x daily - 7 x weekly - 3 sets - 10 reps - Supine Knee Extension Strengthening  - 1 x daily - 7 x weekly - 3 sets - 10 reps - Seated March  - 1 x daily - 7 x weekly - 3 sets - 10 reps  CLINICAL IMPRESSION: [employer]"  is progressing well with her LTGs. She is walking without AD for short distances but still requires cane for longer distances. Her pain has improved, but is still a limiting factor mainly with weightbearing activity. She has met her knee flexion goal, but is still limited with stairs. Strength is improving weekly. Her LEFS has improved 5% showing some functional improvement. She continues to demonstrate weakness in the left LE and unfortunately experienced a fall this week getting out of the shower  when her knee gave out. She did not injure herself, but does report increased soreness in the knee. Beth continues to demonstrate potential for improvement and would benefit from continued skilled therapy to address impairments. I recommend 2x/wk  for 6 weeks at this time.   OBJECTIVE IMPAIRMENTS Abnormal gait, decreased activity tolerance, decreased balance, decreased mobility, difficulty walking, decreased ROM, decreased strength, and pain.   ACTIVITY LIMITATIONS standing, squatting, stairs, and locomotion level  PARTICIPATION LIMITATIONS: community activity and yard work  PERSONAL FACTORS Time since onset of injury/illness/exacerbation are also affecting patient's functional outcome.   REHAB POTENTIAL: Good  CLINICAL DECISION MAKING: Evolving/moderate complexity  EVALUATION COMPLEXITY: Moderate  GOALS: Goals reviewed with patient? Yes  SHORT TERM GOALS: Target date: 01/06/2022   Patient will be independent with initial HEP. Baseline: pt does not have HEP Goal status: MET - 01/12/22   LONG TERM GOALS: Target date: 02/03/2022 extended to 03/18/22  Patient will be independent with advanced/ongoing HEP to improve outcomes and carryover.  Baseline: pt does not have HEP; Independent in current HEP Goal status: IN PROGRESS  2.  Patient will report at least 75% improvement in left knee pain to improve QOL. Baseline: 50% as of 02/04/22 Goal status: IN PROGRESS  3.  Patient will demonstrate improved Left knee AROM to >/= 115-125 deg to allow for normal gait and stair mechanics. Baseline: 105; 115 deg flexion as of 02/04/22 however stairs are still limited Goal status: IN PROGRESS  4.  Patient will demonstrate improved functional LE strength as demonstrated by 4+/5 hip and knee strength bilat. Baseline: 3-/5 on Lt, 4/5 Rt  MET on R LE 02/04/22 Goal status: IN PROGRESS ON L LE  5.  Patient will be able to ambulate 600' with LRAD and normal gait pattern without increased pain to access community.  Baseline: 18' with SPC with abnormal gait pattern; Able to amb 300 feet without AD but with increased pain Goal status: IN PROGRESS  6.  Patient will report 50% on LEFS  to demonstrate improved functional ability. Baseline: 12.5%;  17.5% on 02/04/22 Goal status: IN PROGRESS   PLAN: PT FREQUENCY: 2x/week  PT DURATION: 6 weeks  PLANNED INTERVENTIONS: Therapeutic exercises, Therapeutic activity, Neuromuscular re-education, Balance training, Gait training, Patient/Family education, Self Care, Joint mobilization, Stair training, DME instructions, Aquatic Therapy, Dry Needling, Cryotherapy, Moist heat, Taping, Manual therapy, and Re-evaluation  PLAN FOR NEXT SESSION: continue strengthening as tolerated,  MT +/- DN as indicated,  continue to work on gait, ROM and balance.    Kinza Gouveia, PT 02/04/2022, 2:15 PM    PHYSICAL THERAPY DISCHARGE SUMMARY  Visits from Start of Care: 8  Current functional level related to goals / functional outcomes:   Refer to above clinical impression and goal assessment for status as of last visit on 02/04/2022.  Patient canceled and no showed for the next 2 scheduled visits, and has not returned to physical therapy in greater than 30 days, therefore will proceed with discharge from PT for this episode.    Remaining deficits:   As above.  Unable to formally assess status at discharge due to failure to return to physical therapy.   Education / Equipment:   HEP   Patient agrees to discharge. Patient goals were partially met. Patient is being discharged due to not returning since the last visit.  Percival Spanish, PT, MPT 03/23/22, 9:08 AM  [facility] [place], [place], [address] Phone: [phone]  Fax:  [phone]

## 2022-02-04 ENCOUNTER — Encounter: Payer: Self-pay | Admitting: Physical Therapy

## 2022-02-04 ENCOUNTER — Ambulatory Visit: Payer: Commercial Managed Care - HMO | Attending: Family Medicine | Admitting: Physical Therapy

## 2022-02-04 DIAGNOSIS — R262 Difficulty in walking, not elsewhere classified: Secondary | ICD-10-CM | POA: Insufficient documentation

## 2022-02-04 DIAGNOSIS — M6281 Muscle weakness (generalized): Secondary | ICD-10-CM | POA: Diagnosis present

## 2022-02-04 DIAGNOSIS — M25562 Pain in left knee: Secondary | ICD-10-CM | POA: Insufficient documentation

## 2022-02-09 ENCOUNTER — Ambulatory Visit: Payer: Commercial Managed Care - HMO | Admitting: Physical Therapy

## 2022-02-12 NOTE — Telephone Encounter (Signed)
Received fax from [place] stating Durolane is approved 01/26/22- 07/28/22

## 2022-02-12 NOTE — Telephone Encounter (Signed)
Pt informed of below.  OV scheduled 01/18/22 @ 2:30. Durolane ordered with rep.

## 2022-02-15 ENCOUNTER — Ambulatory Visit: Payer: Commercial Managed Care - HMO | Admitting: Physical Therapy

## 2022-02-18 ENCOUNTER — Ambulatory Visit: Payer: Commercial Managed Care - HMO | Admitting: Family Medicine

## 2022-02-23 ENCOUNTER — Ambulatory Visit: Payer: Commercial Managed Care - HMO | Admitting: Family Medicine

## 2022-03-02 ENCOUNTER — Encounter: Payer: Self-pay | Admitting: Family Medicine

## 2022-03-02 ENCOUNTER — Ambulatory Visit (INDEPENDENT_AMBULATORY_CARE_PROVIDER_SITE_OTHER): Payer: Commercial Managed Care - HMO | Admitting: Family Medicine

## 2022-03-02 ENCOUNTER — Ambulatory Visit: Payer: Self-pay

## 2022-03-02 VITALS — BP 118/68 | Ht 66.0 in | Wt 220.0 lb

## 2022-03-02 DIAGNOSIS — M1712 Unilateral primary osteoarthritis, left knee: Secondary | ICD-10-CM

## 2022-03-02 MED ORDER — SODIUM HYALURONATE 60 MG/3ML IX PRSY
60.0000 mg | PREFILLED_SYRINGE | Freq: Once | INTRA_ARTICULAR | Status: AC
  Administered 2022-03-02: 60 mg via INTRA_ARTICULAR

## 2022-03-02 NOTE — Progress Notes (Signed)
  Kimberly Hanson - 56 y.o. female MRN [id number]  Date of birth: 14-Dec-1965  SUBJECTIVE:  Including CC & ROS.  No chief complaint on file.   Kimberly Hanson is a 56 y.o. female that is here for a gel injection.   Review of Systems See HPI   HISTORY: Past Medical, Surgical, Social, and Family History Reviewed & Updated per EMR.   Pertinent Historical Findings include:  Past Medical History:  Diagnosis Date   Cholelithiasis    On CT 06/26/13   Diverticulosis    On CT 06/26/13   Hyperthyroidism    Multinodular goiter    Seizures ([place])     Past Surgical History:  Procedure Laterality Date   ABDOMINAL HYSTERECTOMY  2002   CESAREAN SECTION     x 2   COLONOSCOPY N/A 07/04/2013   Procedure: COLONOSCOPY;  Surgeon: Milus Banister, MD;  Location: [facility];  Service: Endoscopy;  Laterality: N/A;     PHYSICAL EXAM:  VS: BP 118/68   Ht '5\' 6"'$  (1.676 m)   Wt 220 lb (99.8 kg)   BMI 35.51 kg/m  Physical Exam Gen: NAD, alert, cooperative with exam, well-appearing MSK:  Neurovascularly intact     Aspiration/Injection Procedure Note Kimberly Hanson 26-Oct-1965  Procedure: Injection Indications: left knee pain  Procedure Details Consent: Risks of procedure as well as the alternatives and risks of each were explained to the (patient/caregiver).  Consent for procedure obtained. Time Out: Verified patient identification, verified procedure, site/side was marked, verified correct patient position, special equipment/implants available, medications/allergies/relevent history reviewed, required imaging and test results available.  Performed.  The area was cleaned with iodine and alcohol swabs.    The left knee superior lateral suprapatellar pouch was injected using 4 cc's of 1% lidocaine with a 22 1 1/2" needle.  The syringe was switched and 60 mg/3 mL of durolane was injected. Ultrasound was used. Images were obtained in  Long views showing the injection.    A sterile dressing was  applied.  Patient did tolerate procedure well.    ASSESSMENT & PLAN:   OA (osteoarthritis) of knee Completed Durolane injection.

## 2022-03-02 NOTE — Patient Instructions (Signed)
Good to see you Please use ice as needed  Please send me a message in MyChart with any questions or updates.  Please see me back in 4 weeks or as needed if improved.   --Dr. Raeford Razor

## 2022-03-02 NOTE — Assessment & Plan Note (Signed)
Completed Durolane injection.

## 2022-07-19 ENCOUNTER — Encounter: Payer: Self-pay | Admitting: *Deleted
